# Patient Record
Sex: Male | Born: 1955 | Race: White | Marital: Married | Smoking: Never smoker
Health system: Southern US, Community
[De-identification: ages and names within clinical notes are randomized; demographics above are authoritative.]

## PROBLEM LIST (undated history)

## (undated) DIAGNOSIS — I10 Essential (primary) hypertension: Secondary | ICD-10-CM

## (undated) DIAGNOSIS — G4730 Sleep apnea, unspecified: Secondary | ICD-10-CM

## (undated) DIAGNOSIS — E663 Overweight: Secondary | ICD-10-CM

## (undated) DIAGNOSIS — I4892 Unspecified atrial flutter: Secondary | ICD-10-CM

## (undated) DIAGNOSIS — I5022 Chronic systolic (congestive) heart failure: Secondary | ICD-10-CM

## (undated) DIAGNOSIS — E119 Type 2 diabetes mellitus without complications: Secondary | ICD-10-CM

## (undated) DIAGNOSIS — E785 Hyperlipidemia, unspecified: Secondary | ICD-10-CM

---

## 2015-08-11 ENCOUNTER — Other Ambulatory Visit: Payer: Self-pay

## 2015-08-11 ENCOUNTER — Inpatient Hospital Stay: Payer: Self-pay

## 2015-08-11 ENCOUNTER — Emergency Department: Payer: Self-pay

## 2015-08-11 ENCOUNTER — Inpatient Hospital Stay
Admission: EM | Admit: 2015-08-11 | Discharge: 2015-08-15 | DRG: 308 | Disposition: A | Discharge status: Home / Self Care | Payer: Self-pay | Attending: Internal Medicine | Admitting: Internal Medicine

## 2015-08-11 DIAGNOSIS — E669 Obesity, unspecified: Secondary | ICD-10-CM | POA: Insufficient documentation

## 2015-08-11 DIAGNOSIS — I4892 Unspecified atrial flutter: Secondary | ICD-10-CM

## 2015-08-11 DIAGNOSIS — I480 Paroxysmal atrial fibrillation: Secondary | ICD-10-CM | POA: Insufficient documentation

## 2015-08-11 DIAGNOSIS — Z7984 Long term (current) use of oral hypoglycemic drugs: Secondary | ICD-10-CM

## 2015-08-11 DIAGNOSIS — Z79899 Other long term (current) drug therapy: Secondary | ICD-10-CM

## 2015-08-11 DIAGNOSIS — Z7982 Long term (current) use of aspirin: Secondary | ICD-10-CM

## 2015-08-11 DIAGNOSIS — G4733 Obstructive sleep apnea (adult) (pediatric): Secondary | ICD-10-CM | POA: Diagnosis present

## 2015-08-11 DIAGNOSIS — Z7901 Long term (current) use of anticoagulants: Secondary | ICD-10-CM

## 2015-08-11 DIAGNOSIS — Z8249 Family history of ischemic heart disease and other diseases of the circulatory system: Secondary | ICD-10-CM

## 2015-08-11 DIAGNOSIS — I471 Supraventricular tachycardia: Secondary | ICD-10-CM

## 2015-08-11 DIAGNOSIS — R0902 Hypoxemia: Secondary | ICD-10-CM | POA: Diagnosis present

## 2015-08-11 DIAGNOSIS — E119 Type 2 diabetes mellitus without complications: Secondary | ICD-10-CM | POA: Diagnosis present

## 2015-08-11 DIAGNOSIS — I483 Typical atrial flutter: Principal | ICD-10-CM | POA: Diagnosis present

## 2015-08-11 DIAGNOSIS — E785 Hyperlipidemia, unspecified: Secondary | ICD-10-CM | POA: Diagnosis present

## 2015-08-11 DIAGNOSIS — I5031 Acute diastolic (congestive) heart failure: Secondary | ICD-10-CM | POA: Insufficient documentation

## 2015-08-11 DIAGNOSIS — K219 Gastro-esophageal reflux disease without esophagitis: Secondary | ICD-10-CM | POA: Diagnosis present

## 2015-08-11 DIAGNOSIS — I110 Hypertensive heart disease with heart failure: Secondary | ICD-10-CM | POA: Diagnosis present

## 2015-08-11 DIAGNOSIS — I5021 Acute systolic (congestive) heart failure: Secondary | ICD-10-CM | POA: Diagnosis present

## 2015-08-11 DIAGNOSIS — R0602 Shortness of breath: Secondary | ICD-10-CM | POA: Insufficient documentation

## 2015-08-11 HISTORY — DX: Essential (primary) hypertension: I10

## 2015-08-11 HISTORY — DX: Hyperlipidemia, unspecified: E78.5

## 2015-08-11 HISTORY — DX: Overweight: E66.3

## 2015-08-11 LAB — BASIC METABOLIC PANEL
ANION GAP: 10 (ref 5–15)
BUN: 17 mg/dL (ref 6–20)
CALCIUM: 8.6 mg/dL — AB (ref 8.9–10.3)
CO2: 22 mmol/L (ref 22–32)
CREATININE: 0.99 mg/dL (ref 0.61–1.24)
Chloride: 105 mmol/L (ref 101–111)
GFR calc Af Amer: >60 mL/min (ref >60)
GLUCOSE: 181 mg/dL — AB (ref 65–99)
Potassium: 3.8 mmol/L (ref 3.5–5.1)
Sodium: 137 mmol/L (ref 135–145)

## 2015-08-11 LAB — CBC
HEMATOCRIT: 44.7 % (ref 40.0–52.0)
Hemoglobin: 15.6 g/dL (ref 13.0–18.0)
MCH: 32.5 pg (ref 26.0–34.0)
MCHC: 34.9 g/dL (ref 32.0–36.0)
MCV: 93.1 fL (ref 80.0–100.0)
PLATELETS: 219 10*3/uL (ref 150–440)
RBC: 4.8 MIL/uL (ref 4.40–5.90)
RDW: 12.5 % (ref 11.5–14.5)
WBC: 12 10*3/uL — AB (ref 3.8–10.6)

## 2015-08-11 LAB — BRAIN NATRIURETIC PEPTIDE: B Natriuretic Peptide: 297 pg/mL — ABNORMAL HIGH (ref 0.0–100.0)

## 2015-08-11 LAB — TROPONIN I
Troponin I: <0.03 ng/mL (ref <0.031)
Troponin I: 0.03 ng/mL (ref <0.031)
Troponin I: <0.03 ng/mL (ref <0.031)

## 2015-08-11 LAB — GLUCOSE, CAPILLARY
GLUCOSE-CAPILLARY: 156 mg/dL — AB (ref 65–99)
GLUCOSE-CAPILLARY: 178 mg/dL — AB (ref 65–99)
Glucose-Capillary: 175 mg/dL — ABNORMAL HIGH (ref 65–99)

## 2015-08-11 LAB — MRSA PCR SCREENING: MRSA by PCR: NEGATIVE

## 2015-08-11 LAB — TSH: TSH: 0.979 u[IU]/mL (ref 0.350–4.500)

## 2015-08-11 LAB — MAGNESIUM: Magnesium: 1.9 mg/dL (ref 1.7–2.4)

## 2015-08-11 MED ORDER — IOPAMIDOL (ISOVUE-370) INJECTION 76%
75.0000 mL | Freq: Once | INTRAVENOUS | Status: AC | PRN
  Administered 2015-08-11: 75 mL via INTRAVENOUS

## 2015-08-11 MED ORDER — SODIUM CHLORIDE 0.9% FLUSH
3.0000 mL | Freq: Two times a day (BID) | INTRAVENOUS | Status: DC
  Administered 2015-08-11 – 2015-08-13 (×5): 3 mL via INTRAVENOUS

## 2015-08-11 MED ORDER — ADENOSINE 6 MG/2ML IV SOLN
INTRAVENOUS | Status: AC
  Administered 2015-08-11: 11:00:00
  Filled 2015-08-11: qty 2

## 2015-08-11 MED ORDER — ASPIRIN EC 81 MG PO TBEC: 81.0000 mg | DELAYED_RELEASE_TABLET | Freq: Every day | ORAL | Status: DC

## 2015-08-11 MED ORDER — SODIUM CHLORIDE 0.9 % IV BOLUS (SEPSIS): 1000.0000 mL | Freq: Once | INTRAVENOUS | Status: DC

## 2015-08-11 MED ORDER — RIVAROXABAN 20 MG PO TABS
20.0000 mg | ORAL_TABLET | Freq: Every day | ORAL | Status: DC
  Administered 2015-08-11 – 2015-08-15 (×5): 20 mg via ORAL
  Filled 2015-08-11: qty 2
  Filled 2015-08-11 (×2): qty 1
  Filled 2015-08-11: qty 2
  Filled 2015-08-11 (×2): qty 1
  Filled 2015-08-11: qty 2

## 2015-08-11 MED ORDER — DILTIAZEM HCL 100 MG IV SOLR
5.0000 mg/h | INTRAVENOUS | Status: DC
  Administered 2015-08-11: 15 mg/h via INTRAVENOUS
  Administered 2015-08-11: 5 mg/h via INTRAVENOUS
  Administered 2015-08-12: 10 mg/h via INTRAVENOUS
  Administered 2015-08-12: 15 mg/h via INTRAVENOUS
  Filled 2015-08-11 (×5): qty 100

## 2015-08-11 MED ORDER — ACETAMINOPHEN 650 MG RE SUPP: 650.0000 mg | Freq: Four times a day (QID) | RECTAL | Status: DC | PRN

## 2015-08-11 MED ORDER — DILTIAZEM HCL 25 MG/5ML IV SOLN
10.0000 mg | Freq: Once | INTRAVENOUS | Status: AC
  Administered 2015-08-11: 10 mg via INTRAVENOUS
  Filled 2015-08-11: qty 5

## 2015-08-11 MED ORDER — ADENOSINE 12 MG/4ML IV SOLN
INTRAVENOUS | Status: AC
  Administered 2015-08-11: 12 mg via INTRAVENOUS
  Filled 2015-08-11: qty 8

## 2015-08-11 MED ORDER — INSULIN ASPART 100 UNIT/ML ~~LOC~~ SOLN: 0.0000 [IU] | Freq: Every day | SUBCUTANEOUS | Status: DC

## 2015-08-11 MED ORDER — SODIUM CHLORIDE 0.9 % IV BOLUS (SEPSIS)
1000.0000 mL | Freq: Once | INTRAVENOUS | Status: AC
  Administered 2015-08-11: 1000 mL via INTRAVENOUS

## 2015-08-11 MED ORDER — METOPROLOL TARTRATE 50 MG PO TABS
50.0000 mg | ORAL_TABLET | Freq: Once | ORAL | Status: AC
  Administered 2015-08-11: 50 mg via ORAL

## 2015-08-11 MED ORDER — ZOLPIDEM TARTRATE 5 MG PO TABS
5.0000 mg | ORAL_TABLET | Freq: Every evening | ORAL | Status: DC | PRN
  Administered 2015-08-11 – 2015-08-14 (×4): 5 mg via ORAL
  Filled 2015-08-11 (×5): qty 1

## 2015-08-11 MED ORDER — METOPROLOL TARTRATE 50 MG PO TABS
ORAL_TABLET | ORAL | Status: AC
  Administered 2015-08-11: 50 mg via ORAL
  Filled 2015-08-11: qty 1

## 2015-08-11 MED ORDER — METOPROLOL TARTRATE 25 MG PO TABS
25.0000 mg | ORAL_TABLET | Freq: Three times a day (TID) | ORAL | Status: DC
  Administered 2015-08-11 – 2015-08-13 (×7): 25 mg via ORAL
  Filled 2015-08-11 (×7): qty 1

## 2015-08-11 MED ORDER — DIGOXIN 0.25 MG/ML IJ SOLN
0.5000 mg | Freq: Once | INTRAMUSCULAR | Status: AC
  Administered 2015-08-11: 0.5 mg via INTRAVENOUS
  Filled 2015-08-11: qty 2

## 2015-08-11 MED ORDER — ADENOSINE 6 MG/2ML IV SOLN
6.0000 mg | Freq: Once | INTRAVENOUS | Status: AC
Start: 2015-08-11 — End: 2015-08-11
  Administered 2015-08-11: 6 mg via INTRAVENOUS

## 2015-08-11 MED ORDER — INSULIN ASPART 100 UNIT/ML ~~LOC~~ SOLN
0.0000 [IU] | Freq: Three times a day (TID) | SUBCUTANEOUS | Status: DC
  Administered 2015-08-11 – 2015-08-12 (×3): 2 [IU] via SUBCUTANEOUS
  Administered 2015-08-12: 3 [IU] via SUBCUTANEOUS
  Administered 2015-08-14: 7 [IU] via SUBCUTANEOUS
  Administered 2015-08-14: 1 [IU] via SUBCUTANEOUS
  Administered 2015-08-15: 2 [IU] via SUBCUTANEOUS
  Administered 2015-08-15: 3 [IU] via SUBCUTANEOUS
  Filled 2015-08-11: qty 1
  Filled 2015-08-11 (×2): qty 3
  Filled 2015-08-11: qty 5
  Filled 2015-08-11: qty 7
  Filled 2015-08-11 (×3): qty 2

## 2015-08-11 MED ORDER — METOPROLOL TARTRATE 5 MG/5ML IV SOLN
INTRAVENOUS | Status: AC
  Administered 2015-08-11: 10 mg via INTRAVENOUS
  Filled 2015-08-11: qty 10

## 2015-08-11 MED ORDER — METOPROLOL TARTRATE 5 MG/5ML IV SOLN
10.0000 mg | Freq: Once | INTRAVENOUS | Status: AC
  Administered 2015-08-11: 10 mg via INTRAVENOUS
  Filled 2015-08-11: qty 10

## 2015-08-11 MED ORDER — ENOXAPARIN SODIUM 40 MG/0.4ML ~~LOC~~ SOLN: 40.0000 mg | SUBCUTANEOUS | Status: DC

## 2015-08-11 MED ORDER — FUROSEMIDE 10 MG/ML IJ SOLN
40.0000 mg | Freq: Once | INTRAMUSCULAR | Status: AC
  Administered 2015-08-11: 40 mg via INTRAVENOUS
  Filled 2015-08-11: qty 4

## 2015-08-11 MED ORDER — FUROSEMIDE 10 MG/ML IJ SOLN
40.0000 mg | Freq: Two times a day (BID) | INTRAMUSCULAR | Status: DC
  Administered 2015-08-12 – 2015-08-15 (×7): 40 mg via INTRAVENOUS
  Filled 2015-08-11 (×8): qty 4

## 2015-08-11 MED ORDER — POTASSIUM CHLORIDE CRYS ER 20 MEQ PO TBCR: 40.0000 meq | EXTENDED_RELEASE_TABLET | Freq: Once | ORAL | Status: DC

## 2015-08-11 MED ORDER — ADENOSINE 12 MG/4ML IV SOLN
12.0000 mg | Freq: Once | INTRAVENOUS | Status: AC
  Administered 2015-08-11: 12 mg via INTRAVENOUS

## 2015-08-11 MED ORDER — ATORVASTATIN CALCIUM 20 MG PO TABS
20.0000 mg | ORAL_TABLET | Freq: Every day | ORAL | Status: DC
  Filled 2015-08-11: qty 1

## 2015-08-11 MED ORDER — ACETAMINOPHEN 325 MG PO TABS: 650.0000 mg | ORAL_TABLET | Freq: Four times a day (QID) | ORAL | Status: DC | PRN

## 2015-08-11 MED ORDER — METOPROLOL TARTRATE 5 MG/5ML IV SOLN
10.0000 mg | Freq: Once | INTRAVENOUS | Status: AC
Start: 2015-08-11 — End: 2015-08-11
  Administered 2015-08-11: 10 mg via INTRAVENOUS

## 2015-08-11 NOTE — H&P (Signed)
[facility] [place] NAME: Jeffrey Ortiz    MR#:  [id number]  DATE OF BIRTH:  02-Oct-1955  DATE OF ADMISSION:  08/11/2015  PRIMARY CARE PHYSICIAN: Deeann Cree, MD   REQUESTING/REFERRING PHYSICIAN: Dr Eula Listen  CHIEF COMPLAINT:   Chief Complaint  Patient presents with  . Gastroesophageal Reflux  . Shortness of Breath  . Tachycardia    HISTORY OF PRESENT ILLNESS:  Jeffrey Ortiz  is a 59 y.o. male with a known history of Hypertension, hyperlipidemia and diabetes. 2 weeks ago he stated he woke up at 3:30 in the morning with some acid reflux that went up into his nose and mouth and he thinks he may have aspirated. Every night when he is laying down he wakes up gasping for air. He's been coughing up some phlegm. He's felt palpitations for at least the last 7 days and shortness of breath. Today he went into the walk-in clinic and found his blood pressure very high and his heart rate very high and he was sent into the ER for further evaluation. In the ER he was found to be in rapid atrial flutter and numerous doses of IV medications were given without response. 2 doses of adenosine, 1 dose of Cardizem and 2 doses of metoprolol were given. Hospitalist services were contacted for further evaluation.  PAST MEDICAL HISTORY:   Past Medical History  Diagnosis Date  . Essential hypertension   . Diabetes mellitus without complication ([place])   . GERD (gastroesophageal reflux disease)     PAST SURGICAL HISTORY:  History reviewed. No pertinent past surgical history.  SOCIAL HISTORY:   Social History  Substance Use Topics  . Smoking status: Never Smoker   . Smokeless tobacco: Not on file  . Alcohol Use: No    FAMILY HISTORY:  No family history on file.  DRUG ALLERGIES:  No Known Allergies  REVIEW OF SYSTEMS:  CONSTITUTIONAL: No fever, Positive for fatigue. Positive for weight gain. EYES: Positive for blurred  vision EARS, NOSE, AND THROAT: No tinnitus or ear pain. Positive for sore throat RESPIRATORY: Positive for cough and shortness of breath. No wheezing or hemoptysis.  CARDIOVASCULAR: No chest pain. Positive for orthopnea and palpitations GASTROINTESTINAL: No nausea, vomiting, diarrhea or abdominal pain. No blood in bowel movements GENITOURINARY: No dysuria, hematuria.  ENDOCRINE: No polyuria, nocturia,  HEMATOLOGY: No anemia, easy bruising or bleeding SKIN: No rash or lesion. MUSCULOSKELETAL: No joint pain or arthritis.   NEUROLOGIC: No tingling, numbness, weakness. Feels lightheaded PSYCHIATRY: No anxiety or depression.   MEDICATIONS AT HOME:   Prior to Admission medications   Medication Sig Start Date End Date Taking? Authorizing Provider  aspirin EC 81 MG tablet Take 81 mg by mouth daily.   Yes Historical Provider, MD  atorvastatin (LIPITOR) 20 MG tablet Take 20 mg by mouth daily.   Yes Historical Provider, MD  lisinopril (PRINIVIL,ZESTRIL) 30 MG tablet Take 30 mg by mouth daily.   Yes Historical Provider, MD  metFORMIN (GLUCOPHAGE) 500 MG tablet Take by mouth 2 (two) times daily with a meal. May increase to 1000mg  two times a day as needed   Yes Historical Provider, MD      VITAL SIGNS:  Blood pressure 108/77, pulse 139, resp. rate 19, SpO2 95 %.  PHYSICAL EXAMINATION:  GENERAL:  60 y.o.-year-old patient lying in the bed with no acute distress.  EYES: Pupils equal, round, reactive to light and accommodation. No scleral icterus. Extraocular muscles intact.  HEENT: Head atraumatic, normocephalic. Oropharynx and nasopharynx clear.  NECK:  Supple, no jugular venous distention. No thyroid enlargement, no tenderness.  LUNGS: Decreased breath sounds bilaterally bases, no wheezing, positive rales at the bases. No rhonchi or crepitation. No use of accessory muscles of respiration.  CARDIOVASCULAR: S1, S2 irregularly irregular tachycardic. No murmurs, rubs, or gallops.  ABDOMEN: Soft,  nontender, nondistended. Bowel sounds present. No organomegaly or mass.  EXTREMITIES: Trace edema, no cyanosis, or clubbing.  NEUROLOGIC: Cranial nerves II through XII are intact. Muscle strength 5/5 in all extremities. Sensation intact. Gait not checked.  PSYCHIATRIC: The patient is alert and oriented x 3.  SKIN: No rash, lesion, or ulcer.   LABORATORY PANEL:   CBC  Recent Labs Lab 08/11/15 1008  WBC 12.0*  HGB 15.6  HCT 44.7  PLT 219   ------------------------------------------------------------------------------------------------------------------  Chemistries   Recent Labs Lab 08/11/15 1008  NA 137  K 3.8  CL 105  CO2 22  GLUCOSE 181*  BUN 17  CREATININE 0.99  CALCIUM 8.6*  MG 1.9   ------------------------------------------------------------------------------------------------------------------  Cardiac Enzymes  Recent Labs Lab 08/11/15 1008  TROPONINI <0.03     RADIOLOGY:  Ct Angio Chest Pe W/cm &/or Wo Cm  08/11/2015  CLINICAL DATA:  Tachycardia and bilateral lower extremity swelling. EXAM: CT ANGIOGRAPHY CHEST WITH CONTRAST TECHNIQUE: Multidetector CT imaging of the chest was performed using the standard protocol during bolus administration of intravenous contrast. Multiplanar CT image reconstructions and MIPs were obtained to evaluate the vascular anatomy. CONTRAST:  75 cc Isovue 370 intravenous COMPARISON:  None. FINDINGS: Mediastinum/Lymph Nodes: Mild cardiomegaly. No pericardial effusion. Non-opacified aorta without acute finding. Motion degraded study, best obtainable in this patient with shortness of breath. There is no indication of pulmonary embolism. Small fatty Bochdalek's hernia on the left. Lungs/Pleura: Diffuse septal thickening with small borderline moderate bilateral layering pleural effusion. Patchy ground-glass opacities in the bilateral lungs is consistent with alveolar edema in this clinical setting. Diffuse airway thickening is considered  congestive. Upper abdomen: No acute findings. Musculoskeletal: No chest wall mass or suspicious bone lesions identified. Review of the MIP images confirms the above findings. IMPRESSION: 1. CHF. 2. Motion degraded CTA without evidence of pulmonary embolism. Electronically Signed   By: Monte Fantasia M.D.   On: 08/11/2015 13:10   Dg Chest Portable 1 View  08/11/2015  CLINICAL DATA:  Tachycardia. EXAM: PORTABLE CHEST 1 VIEW COMPARISON:  None. FINDINGS: Cardiopericardial enlargement. Vascular pedicle widening and diffuse interstitial coarsening with Kerley lines. No asymmetric consolidation. Negative aortic contours for technique. No acute osseous finding. Small cervical ribs. IMPRESSION: CHF pattern. Electronically Signed   By: Monte Fantasia M.D.   On: 08/11/2015 10:42    EKG:   Atrial flutter 148 bpm nonspecific ST-T wave changes  IMPRESSION AND PLAN:   1. Atrial flutter with rapid ventricular response. Received 2 doses of adenosine, 1 dose of IV Cardizem and 2 doses of oral Cardizem. I ordered for 50 mg oral metoprolol and a Cardizem drip. I spoke with cardiology Dr. Rockey Situ to see the patient in consultation. I will obtain an echocardiogram. TSH in normal range. Monitor closely in the CCU stepdown 2. Acute congestive heart failure likely secondary to fast heart rate. Check an echocardiogram. Give 1 dose of IV Lasix at this point. 3. Accelerated hypertension on presentation to the hospital. Blood pressure lower now secondary to medications given. Continue to monitor closely in the CCU stepdown. 4. Hyperlipidemia unspecified on Lipitor 5. Type 2 diabetes. Hold Glucophage since  receive IV contrast. Put on sliding scale.   All the records are reviewed and case discussed with ED provider. Management plans discussed with the patient, family and they are in agreement.  CODE STATUS: Full code  TOTAL TIME TAKING CARE OF THIS PATIENT: 50 minutes. Patient will be admitted to the CCU stepdown for  closer monitoring of heart rate.   Loletha Grayer M.D on 08/11/2015 at 1:44 PM  Between 7am to 6pm - Pager - [phone]  After 6pm call admission pager [phone]  [facility] Office  [phone]  CC: Primary care physician; Deeann Cree, MD

## 2015-08-11 NOTE — Consult Note (Signed)
ANTICOAGULATION CONSULT NOTE - Initial Consult  Pharmacy Consult for xarelto Indication: aflutter  No Known Allergies  Patient Measurements:   Heparin Dosing Weight:   Vital Signs: BP: 108/77 mmHg (06/26 1200) Pulse Rate: 139 (06/26 1200)  Labs:  Recent Labs  08/11/15 1008  HGB 15.6  HCT 44.7  PLT 219  CREATININE 0.99  TROPONINI <0.03    CrCl cannot be calculated (Unknown ideal weight.).   Medical History: Past Medical History  Diagnosis Date  . Essential hypertension   . Diabetes mellitus without complication ([place])   . GERD (gastroesophageal reflux disease)   . Overweight   . Hyperlipidemia     Medications:  Scheduled:  . adenosine      . aspirin EC  81 mg Oral Daily  . atorvastatin  20 mg Oral Daily  . furosemide  40 mg Intravenous Once  . furosemide  40 mg Intravenous BID  . insulin aspart  0-5 Units Subcutaneous QHS  . insulin aspart  0-9 Units Subcutaneous TID WC  . metoprolol tartrate  25 mg Oral TID  . rivaroxaban  20 mg Oral Q supper  . sodium chloride flush  3 mL Intravenous Q12H    Assessment: Pt is a 60 year old male in aflutter. CHA2DS2VASc = 2 (HTN/DM). Pharmacy consulted to dose xarelto.  IBW CrCl=89 ml/min  Based on weight of 97.5kg and height of 72in in care everywhere.  Goal of Therapy:   Monitor platelets by anticoagulation protocol: Yes   Plan:  Xarelto 20mg  daily with evening meal. Pharmacy to continue to monitor. Thank you for the consult.  Ramond Dial, Pharm.D Clinical Pharmacist   08/11/2015,2:49 PM

## 2015-08-11 NOTE — ED Provider Notes (Signed)
[facility] Emergency Department Provider Note  ____________________________________________  Time seen: Approximately 10:11 AM  I have reviewed the triage vital signs and the nursing notes.   HISTORY  Chief Complaint Gastroesophageal Reflux; Shortness of Breath; and Tachycardia    HPI Jeffrey Ortiz is a 60 y.o. male with a history of HTN and DM 2 sent from clinic for tachycardia with a heart rate in the 140s. The patient reports that for the past 2 weeks he has been having difficulty sleeping due to "gasping for air." He has also been having decreased exercise tolerance, feeling very tired when he tries to exert himself in any way. He denies any chest pain, pressure or tightness, palpitations, lightheadedness or syncope. No recent illness. No recent changes in medications except that he has self decreased his antihyperlipidemic. He takes propranolol for hypertension. The patient did note symmetric bilateral edema of the ankles without calf pain today.   Past Medical History  Diagnosis Date  . Hypertension   . Diabetes mellitus without complication ([place])     There are no active problems to display for this patient.   History reviewed. No pertinent past surgical history.  Current Outpatient Rx  Name  Route  Sig  Dispense  Refill  . aspirin EC 81 MG tablet   Oral   Take 81 mg by mouth daily.         Marland Kitchen atorvastatin (LIPITOR) 20 MG tablet   Oral   Take 20 mg by mouth daily.         Marland Kitchen lisinopril (PRINIVIL,ZESTRIL) 30 MG tablet   Oral   Take 30 mg by mouth daily.         . metFORMIN (GLUCOPHAGE) 500 MG tablet   Oral   Take by mouth 2 (two) times daily with a meal. May increase to 1000mg  two times a day as needed           Allergies Review of patient's allergies indicates no known allergies.  No family history on file.  Social History Social History  Substance Use Topics  . Smoking status: Never Smoker   . Smokeless tobacco: None   . Alcohol Use: No    Review of Systems Constitutional: No fever/chills.No lightheadedness or syncope. Also decreased exercise tolerance. Eyes: No visual changes. ENT: No sore throat. No congestion or rhinorrhea. Cardiovascular: Denies chest pain. Denies palpitations. Positive tachycardia. Respiratory: Positive orthopnea..  No cough. Gastrointestinal: No abdominal pain.  No nausea, no vomiting.  No diarrhea.  No constipation. Genitourinary: Negative for dysuria. Musculoskeletal: Negative for back pain. Positive mild new symmetric bilateral lower extremity edema. No calf pain. Skin: Negative for rash. Neurological: Negative for headaches. No focal numbness, tingling or weakness.  Endocrine: No history of thyroid dysfunction. 10-point ROS otherwise negative.  ____________________________________________   PHYSICAL EXAM:  VITAL SIGNS: ED Triage Vitals  Enc Vitals Group     BP --      Pulse --      Resp --      Temp --      Temp src --      SpO2 --      Weight --      Height --      Head Cir --      Peak Flow --      Pain Score --      Pain Loc --      Pain Edu? --      Excl. in [place]? --  Constitutional: Alert and oriented. Well appearing and in no acute distress. Answers questions appropriately. Eyes: Conjunctivae are normal.  EOMI. No scleral icterus. Head: Atraumatic. Nose: No congestion/rhinnorhea. Mouth/Throat: Mucous membranes are moist.  Neck: No stridor.  Supple.  No JVD. Cardiovascular: Fast rate, regular rhythm. No murmurs, rubs or gallops.  Respiratory: Normal respiratory effort.  No accessory muscle use or retractions. Lungs CTAB.  No wheezes, rales or ronchi. Gastrointestinal: Soft, nontender and nondistended.  No guarding or rebound.  No peritoneal signs. Musculoskeletal: Minimal mildly pitting lower extremity edema to the distal tibial shaft bilaterally. No ttp in the calves or palpable cords.  Negative Homan's sign. Neurologic:  A&Ox3.  Speech is  clear.  Face and smile are symmetric.  EOMI.  Moves all extremities well. Skin:  Skin is warm, dry and intact. No rash noted. Psychiatric: Mood and affect are normal. Speech and behavior are normal.  Normal judgement  ____________________________________________   LABS (all labs ordered are listed, but only abnormal results are displayed)  Labs Reviewed  CBC - Abnormal; Notable for the following:    WBC 12.0 (*)    All other components within normal limits  BASIC METABOLIC PANEL - Abnormal; Notable for the following:    Glucose, Bld 181 (*)    Calcium 8.6 (*)    All other components within normal limits  MAGNESIUM  TROPONIN I  TSH  BRAIN NATRIURETIC PEPTIDE   ____________________________________________  EKG  ED ECG REPORT I, Eula Listen, the attending physician, personally viewed and interpreted this ECG.   Date: 08/11/2015  EKG Time: 959  Rate: 145  Rhythm: sinus tachycardia  Axis: normal  Intervals:prolonged Qtc  ST&T Change: No STEMI  ____________________________________________  RADIOLOGY  Dg Chest Portable 1 View  08/11/2015  CLINICAL DATA:  Tachycardia. EXAM: PORTABLE CHEST 1 VIEW COMPARISON:  None. FINDINGS: Cardiopericardial enlargement. Vascular pedicle widening and diffuse interstitial coarsening with Kerley lines. No asymmetric consolidation. Negative aortic contours for technique. No acute osseous finding. Small cervical ribs. IMPRESSION: CHF pattern. Electronically Signed   By: Monte Fantasia M.D.   On: 08/11/2015 10:42    ____________________________________________   PROCEDURES  Procedure(s) performed: None  Critical Care performed: Yes, see critical care note(s) ____________________________________________   INITIAL IMPRESSION / ASSESSMENT AND PLAN / ED COURSE  Pertinent labs & imaging results that were available during my care of the patient were reviewed by me and considered in my medical decision making (see chart for  details).  60 y.o. male with a history of HTN and DM presenting with tachycardia. The patient's EKG is consistent with a regular complex tachycardia. The patient was given 6 mg of adenosine without any changes. After 12 mg of adenosine, he had a brief slowing down of his rate in the underlying rhythm may have been consistent with a 3-1 flutter. I have ordered metoprolol and we will continue to monitor his symptoms as well as his vital signs. At this time, the patient is mentating well and not in any acute distress. I sent laboratory studies looking for abnormal electrolytes, thyroid dysfunction, and signs of MI the patient has not been having any signs or symptoms that would be consistent with acute coronary syndrome.  CRITICAL CARE Performed by: Eula Listen   Total critical care time: 40 minutes  Critical care time was exclusive of separately billable procedures and treating other patients.  Critical care was necessary to treat or prevent imminent or life-threatening deterioration.  Critical care was time spent personally by me on the  following activities: development of treatment plan with patient and/or surrogate as well as nursing, discussions with consultants, evaluation of patient's response to treatment, examination of patient, obtaining history from patient or surrogate, ordering and performing treatments and interventions, ordering and review of laboratory studies, ordering and review of radiographic studies, pulse oximetry and re-evaluation of patient's condition.  ----------------------------------------- 10:58 AM on 08/11/2015 -----------------------------------------  The patient has had no response to IV metoprolol, and his heart rate continues to be in the 140s. He is maintaining blood pressure in the 160s. I will try diltiazem and reevaluate the patient.  ----------------------------------------- 11:31 AM on  08/11/2015 -----------------------------------------  The patient's heart rate is still 144, and his blood pressure is in the 120s. He states that he is feeling better since his arrival. Burnis Medin try another liter fluid and another dose of metoprolol. The patient's chest x-ray does show possible CHF pattern, but given that he does not have a significant amount of fluid overload on his examination, we will air on the side of rehydration rather than diuresis at this time. The patient's TSH is reassuring and his troponin is negative. Plan admission.  ____________________________________________  FINAL CLINICAL IMPRESSION(S) / ED DIAGNOSES  Final diagnoses:  SVT (supraventricular tachycardia) (HCC)  Shortness of breath      NEW MEDICATIONS STARTED DURING THIS VISIT:  New Prescriptions   No medications on file     Eula Listen, MD 08/11/15 1133

## 2015-08-11 NOTE — ED Notes (Signed)
Pt arrives to ER via POV from [facility]. Pt went to walk in to be evaluated for "gasping for air when I'm falling asleep" X 2 weeks. Pt had reflux episode x 2 weeks ago. Pt HR found to be in 150's at [facility]. Pt wheeled to [facility]. Denies pain. Pt alert and oriented X4, active, cooperative, pt in NAD. RR even and unlabored, color WNL.

## 2015-08-11 NOTE — Progress Notes (Signed)
Paged MD Earleen Newport per patient request to refuse taking atorvastatin due to causing joint pain. Patient stated he is non compliant with this medication at home due to joint pain. Provider did not want to change medication order at this time. Will continue to monitor.

## 2015-08-11 NOTE — Consult Note (Signed)
a  Cardiology Consult    Patient ID: Jeffrey Ortiz MRN: [id number], DOB/AGE: 1955-08-01   Admit date: 08/11/2015 Date of Consult: 08/11/2015  Primary Physician: Deeann Cree, MD Primary Cardiologist: new - seen by Johnny Bridge, MD  Requesting Provider: R. [place]  Patient Profile    60 y/o ? with a h/o HTN, DM, and GERD, who presented to the ED from his PCP's office today 2/2 dyspnea, reduced exercise tolerance, lower ext edema, and atrial flutter.  Past Medical History   Past Medical History  Diagnosis Date  . Essential hypertension   . Diabetes mellitus without complication ([place])   . GERD (gastroesophageal reflux disease)   . Overweight   . Hyperlipidemia     Allergies  No Known Allergies  History of Present Illness    61 y/o ? with a h/o HTN, DM, and HL.  He was in his usoh until ~ 10 days ago, when he awoke suddenly one morning with severe GERD-like symptoms and burning in his throat.  He went into his bathroom and vomited x 1.  He didn't think too much of it but the following day, he noted fatigue and by last Monday, 6/19, he began to note tachypalpitations with reduced exercise tolerance and DOE.  This progressed over the week and became associated with mild ankle edema and orthopnea.  He says that over the past few nights he has been very restless related to orthopnea, which was especially bad last night.  Because of progressive symptoms, he presented to his PCP and was found to be in rapid atrial flutter.  He was referred to the ED for evaluation.  Here, he has been placed on IV dilt and rates are now in the 130's w/ 2:1 atrial flutter.  He is feeling somewhat better.  He denies any h/o chest pain.  Inpatient Medications    . adenosine      . aspirin EC  81 mg Oral Daily  . atorvastatin  20 mg Oral Daily  . enoxaparin (LOVENOX) injection  40 mg Subcutaneous Q24H  . furosemide  40 mg Intravenous Once  . insulin aspart  0-5 Units Subcutaneous QHS  . insulin  aspart  0-9 Units Subcutaneous TID WC  . metoprolol tartrate  25 mg Oral TID  . sodium chloride flush  3 mL Intravenous Q12H    Family History    Family History  Problem Relation Age of Onset  . Heart attack Father     died in his 24's w/ cancer but dx CAD in his 62's.  . Cancer Father   . Heart attack Brother     died in early 42's.  . Other Mother     alive @ 57    Social History    Social History   Social History  . Marital Status: Married    Spouse Name: N/A  . Number of Children: N/A  . Years of Education: N/A   Occupational History  . Not on file.   Social History Main Topics  . Smoking status: Never Smoker   . Smokeless tobacco: Not on file  . Alcohol Use: 0.0 oz/week    0 Standard drinks or equivalent per week     Comment: occasional drink - rare (< 1/wk).  . Drug Use: No     Comment: Used marijuana a few times in college.  Marland Kitchen Sexual Activity: Not on file   Other Topics Concern  . Not on file   Social History Narrative  Lives locally with wife.  Does not routinely exercise.       Review of Systems    General:  No chills, fever, night sweats or weight changes.  Cardiovascular:  No chest pain, +++ dyspnea on exertion, +++ LE edema, +++ orthopnea, +++ palpitations, +++ paroxysmal nocturnal dyspnea. Dermatological: No rash, lesions/masses Respiratory: No cough, +++ dyspnea.  Snores. Urologic: No hematuria, dysuria Abdominal:   No nausea, vomiting, diarrhea, bright red blood per rectum, melena, or hematemesis Neurologic:  No visual changes, wkns, changes in mental status. All other systems reviewed and are otherwise negative except as noted above.  Physical Exam    Blood pressure 108/77, pulse 139, resp. rate 19, SpO2 95 %.  General: Pleasant, NAD Psych: Normal affect. Neuro: Alert and oriented X 3. Moves all extremities spontaneously. HEENT: Normal  Neck: Supple without bruits or JVD. Lungs:  Resp regular and unlabored, diminished breath sounds  bilat w/ bibasilar crackles. Heart: RRR, tachy, no s3, s4, or murmurs. Abdomen: Soft, non-tender, non-distended, BS + x 4.  Extremities: No clubbing, cyanosis.  Trace bilat LE edema. DP/PT/Radials 2+ and equal bilaterally.  Labs     Recent Labs  08/11/15 1008  TROPONINI <0.03   Lab Results  Component Value Date   WBC 12.0* 08/11/2015   HGB 15.6 08/11/2015   HCT 44.7 08/11/2015   MCV 93.1 08/11/2015   PLT 219 08/11/2015     Recent Labs Lab 08/11/15 1008  NA 137  K 3.8  CL 105  CO2 22  BUN 17  CREATININE 0.99  CALCIUM 8.6*  GLUCOSE 181*    Radiology Studies    Ct Angio Chest Pe W/cm &/or Wo Cm  08/11/2015  CLINICAL DATA:  Tachycardia and bilateral lower extremity swelling. EXAM: CT ANGIOGRAPHY CHEST WITH CONTRAST TECHNIQUE: Multidetector CT imaging of the chest was performed using the standard protocol during bolus administration of intravenous contrast. Multiplanar CT image reconstructions and MIPs were obtained to evaluate the vascular anatomy. CONTRAST:  75 cc Isovue 370 intravenous COMPARISON:  None. FINDINGS: Mediastinum/Lymph Nodes: Mild cardiomegaly. No pericardial effusion. Non-opacified aorta without acute finding. Motion degraded study, best obtainable in this patient with shortness of breath. There is no indication of pulmonary embolism. Small fatty Bochdalek's hernia on the left. Lungs/Pleura: Diffuse septal thickening with small borderline moderate bilateral layering pleural effusion. Patchy ground-glass opacities in the bilateral lungs is consistent with alveolar edema in this clinical setting. Diffuse airway thickening is considered congestive. Upper abdomen: No acute findings. Musculoskeletal: No chest wall mass or suspicious bone lesions identified. Review of the MIP images confirms the above findings. IMPRESSION: 1. CHF. 2. Motion degraded CTA without evidence of pulmonary embolism. Electronically Signed   By: Monte Fantasia M.D.   On: 08/11/2015 13:10   Dg  Chest Portable 1 View  08/11/2015  CLINICAL DATA:  Tachycardia. EXAM: PORTABLE CHEST 1 VIEW COMPARISON:  None. FINDINGS: Cardiopericardial enlargement. Vascular pedicle widening and diffuse interstitial coarsening with Kerley lines. No asymmetric consolidation. Negative aortic contours for technique. No acute osseous finding. Small cervical ribs. IMPRESSION: CHF pattern. Electronically Signed   By: Monte Fantasia M.D.   On: 08/11/2015 10:42    ECG & Cardiac Imaging    Aflutter, 2:1 conduction, 145, delayed R progression, nonspecific st/t changes.  Assessment & Plan    1.  Atrial Flutter with RVR:  Pt presented to the ED today with a 1 wk h/o tachypalpitations and progressive DOE, orthopnea, and lower extremity edema.  He has been found to be  in atrial flutter with RVR.  He is currently on IV dilt with some improvement in rate and symptoms, though he remains tachy in the 130's w/ 2:1 flutter.  CHA2DS2VASc = 2 (HTN/DM).  Cont IV dilt.  Add oral benign essential tremor and Xarelto 20 mg daily.  Check echo when rate better. Concerned about possibility of tachy-induced cardiomyopathy.  If he doesn't convert, would plan TEE DCCV following third dose of xarelto on Wednesday.  Ultimately would benefit from outpt EP referral for Aflutter RFCA if he is interested.  2.  Acute CHF: ? Systolic vs diastolic in setting of #1.  Concerned about possible tachy-mediated cardiomyopathy.  Diurese.  Check echo.  Cardiovert if necessary as above.  3.  Essential HTN:  Follow on IV dilt and w/ diuresis.  4.  HL:  Cont statin.  5.  Snoring:  Will need outpt sleep eval.  6.  Type II DM:  Per IM. Signed, Murray Hodgkins, NP 08/11/2015, 2:08 PM

## 2015-08-12 ENCOUNTER — Inpatient Hospital Stay (HOSPITAL_COMMUNITY)
Admit: 2015-08-12 | Discharge: 2015-08-12 | Disposition: A | Discharge status: Home / Self Care | Payer: Self-pay | Attending: Internal Medicine | Admitting: Internal Medicine

## 2015-08-12 ENCOUNTER — Other Ambulatory Visit: Payer: Self-pay | Admitting: Cardiovascular Disease

## 2015-08-12 DIAGNOSIS — I4892 Unspecified atrial flutter: Secondary | ICD-10-CM

## 2015-08-12 LAB — ECHOCARDIOGRAM COMPLETE
AV Area VTI: 2.46 cm2
AV Peak grad: 4 mmHg
AV peak Index: 1.05
AV pk vel: 101 cm/s
Ao pk vel: 0.65 m/s
CHL CUP MV DEC (S): 116
E decel time: 116 msec
E/e' ratio: 17.32
FS: 12 % — AB (ref 28–44)
Height: 72 in
IVS/LV PW RATIO, ED: 0.91
LA diam end sys: 44 mm
LADIAMINDEX: 1.87 cm/m2
LASIZE: 44 mm
LAVOL: 78.2 mL
LAVOLA4C: 67.1 mL
LAVOLIN: 33.3 mL/m2
LDCA: 3.8 cm2
LV E/e' medial: 17.32
LV E/e'average: 17.32
LV PW d: 13.4 mm — AB (ref 0.6–1.1)
LVELAT: 3.81 cm/s
LVOT peak vel: 65.5 cm/s
LVOTD: 22 mm
MV pk E vel: 66 m/s
MVPKAVEL: 93.1 m/s
RV TAPSE: 16.9 mm
TDI e' lateral: 3.81
TDI e' medial: 3.81
Weight: 4021.19 oz

## 2015-08-12 LAB — GLUCOSE, CAPILLARY
GLUCOSE-CAPILLARY: 138 mg/dL — AB (ref 65–99)
GLUCOSE-CAPILLARY: 165 mg/dL — AB (ref 65–99)
GLUCOSE-CAPILLARY: 139 mg/dL — AB (ref 65–99)
Glucose-Capillary: 225 mg/dL — ABNORMAL HIGH (ref 65–99)
Glucose-Capillary: 154 mg/dL — ABNORMAL HIGH (ref 65–99)

## 2015-08-12 LAB — CBC
HCT: 41.2 % (ref 40.0–52.0)
Hemoglobin: 14.1 g/dL (ref 13.0–18.0)
MCH: 32.2 pg (ref 26.0–34.0)
MCHC: 34.2 g/dL (ref 32.0–36.0)
MCV: 94 fL (ref 80.0–100.0)
Platelets: 199 10*3/uL (ref 150–440)
RBC: 4.38 MIL/uL — AB (ref 4.40–5.90)
RDW: 12.6 % (ref 11.5–14.5)
WBC: 10.3 10*3/uL (ref 3.8–10.6)

## 2015-08-12 LAB — BASIC METABOLIC PANEL
Anion gap: 8 (ref 5–15)
BUN: 20 mg/dL (ref 6–20)
CALCIUM: 8.2 mg/dL — AB (ref 8.9–10.3)
CO2: 22 mmol/L (ref 22–32)
CREATININE: 1.01 mg/dL (ref 0.61–1.24)
Chloride: 109 mmol/L (ref 101–111)
GFR calc non Af Amer: >60 mL/min (ref >60)
Glucose, Bld: 124 mg/dL — ABNORMAL HIGH (ref 65–99)
Potassium: 3.8 mmol/L (ref 3.5–5.1)
SODIUM: 139 mmol/L (ref 135–145)

## 2015-08-12 LAB — LIPID PANEL
CHOL/HDL RATIO: 5.6 ratio
Cholesterol: 152 mg/dL (ref 0–200)
HDL: 27 mg/dL — AB (ref >40)
LDL Cholesterol: 105 mg/dL — ABNORMAL HIGH (ref 0–99)
TRIGLYCERIDES: 100 mg/dL (ref <150)
VLDL: 20 mg/dL (ref 0–40)

## 2015-08-12 MED ORDER — DIGOXIN 250 MCG PO TABS
0.2500 mg | ORAL_TABLET | Freq: Every day | ORAL | Status: DC
  Administered 2015-08-12 – 2015-08-15 (×5): 0.25 mg via ORAL
  Filled 2015-08-12: qty 1
  Filled 2015-08-12 (×2): qty 2
  Filled 2015-08-12: qty 1
  Filled 2015-08-12: qty 2

## 2015-08-12 MED ORDER — ZOLPIDEM TARTRATE 5 MG PO TABS: 5.0000 mg | ORAL_TABLET | Freq: Every evening | ORAL | Status: DC | PRN

## 2015-08-12 MED ORDER — EZETIMIBE 10 MG PO TABS
10.0000 mg | ORAL_TABLET | Freq: Every day | ORAL | Status: DC
  Administered 2015-08-12 – 2015-08-15 (×4): 10 mg via ORAL
  Filled 2015-08-12 (×4): qty 1

## 2015-08-12 NOTE — Progress Notes (Addendum)
Patient: Jeffrey Ortiz / Admit Date: 08/11/2015 / Date of Encounter: 08/12/2015, 8:18 AM   Subjective: Apnea overnight, rate better yesterday, rapid this AM up to 130s at rest Slept 5 hours, Good urine outpt in past 24 hours, less SOB   Review of Systems: Review of Systems  Constitutional: Negative.   Respiratory: Positive for shortness of breath.   Cardiovascular: Negative.   Gastrointestinal: Negative.   Musculoskeletal: Negative.   Neurological: Negative.   Psychiatric/Behavioral: Negative.   All other systems reviewed and are negative.   Objective: Telemetry: atrial flutter with rate 130s at rest Physical Exam: Blood pressure 136/104, pulse 57, temperature 98.2 F (36.8 C), temperature source Oral, resp. rate 20, height 6' (1.829 m), weight 251 lb 5.2 oz (114 kg), SpO2 93 %. Body mass index is 34.08 kg/(m^2). General: Well developed, well nourished, in no acute distress. Head: Normocephalic, atraumatic, sclera non-icteric, no xanthomas, nares are without discharge. Neck: Negative for carotid bruits.  Lungs: Clear bilaterally to auscultation without wheezes,+ Scant rales at the bases, Breathing is unlabored. Heart: regular tachy,  S1 S2 without murmurs, rubs, or gallops.  Abdomen: Soft, non-tender, non-distended with normoactive bowel sounds. No rebound/guarding. Extremities: No clubbing or cyanosis. No edema. Distal pedal pulses are 2+ and equal bilaterally. Neuro: Alert and oriented X 3. Moves all extremities spontaneously. Psych:  Responds to questions appropriately with a normal affect.   Intake/Output Summary (Last 24 hours) at 08/12/15 0818 Last data filed at 08/12/15 0600  Gross per 24 hour  Intake 374.08 ml  Output   1350 ml  Net -975.92 ml    Inpatient Medications:  . digoxin  0.25 mg Oral Daily  . ezetimibe  10 mg Oral Daily  . furosemide  40 mg Intravenous BID  . insulin aspart  0-5 Units Subcutaneous QHS  . insulin aspart  0-9 Units Subcutaneous  TID WC  . metoprolol tartrate  25 mg Oral TID  . rivaroxaban  20 mg Oral Q supper  . sodium chloride flush  3 mL Intravenous Q12H   Infusions:  . diltiazem (CARDIZEM) infusion 5 mg/hr (08/11/15 2100)    Labs:  Recent Labs  08/11/15 1008 08/12/15 0351  NA 137 139  K 3.8 3.8  CL 105 109  CO2 22 22  GLUCOSE 181* 124*  BUN 17 20  CREATININE 0.99 1.01  CALCIUM 8.6* 8.2*  MG 1.9  --    No results for input(s): AST, ALT, ALKPHOS, BILITOT, PROT, ALBUMIN in the last 72 hours.  Recent Labs  08/11/15 1008 08/12/15 0351  WBC 12.0* 10.3  HGB 15.6 14.1  HCT 44.7 41.2  MCV 93.1 94.0  PLT 219 199    Recent Labs  08/11/15 1008 08/11/15 1506 08/11/15 1920  TROPONINI <0.03 0.03 <0.03   Invalid input(s): POCBNP No results for input(s): HGBA1C in the last 72 hours.   Weights: Filed Weights   08/11/15 1510  Weight: 251 lb 5.2 oz (114 kg)     Radiology/Studies:  Ct Angio Chest Pe W/cm &/or Wo Cm  08/11/2015  CLINICAL DATA:  Tachycardia and bilateral lower extremity swelling. EXAM: CT ANGIOGRAPHY CHEST WITH CONTRAST TECHNIQUE: Multidetector CT imaging of the chest was performed using the standard protocol during bolus administration of intravenous contrast. Multiplanar CT image reconstructions and MIPs were obtained to evaluate the vascular anatomy. CONTRAST:  75 cc Isovue 370 intravenous COMPARISON:  None. FINDINGS: Mediastinum/Lymph Nodes: Mild cardiomegaly. No pericardial effusion. Non-opacified aorta without acute finding. Motion degraded study, best obtainable in  this patient with shortness of breath. There is no indication of pulmonary embolism. Small fatty Bochdalek's hernia on the left. Lungs/Pleura: Diffuse septal thickening with small borderline moderate bilateral layering pleural effusion. Patchy ground-glass opacities in the bilateral lungs is consistent with alveolar edema in this clinical setting. Diffuse airway thickening is considered congestive. Upper abdomen: No  acute findings. Musculoskeletal: No chest wall mass or suspicious bone lesions identified. Review of the MIP images confirms the above findings. IMPRESSION: 1. CHF. 2. Motion degraded CTA without evidence of pulmonary embolism. Electronically Signed   By: Monte Fantasia M.D.   On: 08/11/2015 13:10   Dg Chest Portable 1 View  08/11/2015  CLINICAL DATA:  Tachycardia. EXAM: PORTABLE CHEST 1 VIEW COMPARISON:  None. FINDINGS: Cardiopericardial enlargement. Vascular pedicle widening and diffuse interstitial coarsening with Kerley lines. No asymmetric consolidation. Negative aortic contours for technique. No acute osseous finding. Small cervical ribs. IMPRESSION: CHF pattern. Electronically Signed   By: Monte Fantasia M.D.   On: 08/11/2015 10:42     Assessment and Plan  60 y.o. male   --- Atrial flutter, typical Would continue diltiazem infusion 10 to 15 mg/hr, Digoxin 0.25 mg daily Continue metoprolol as BP tolerates  Will plan for TEE and cardioversion, discussed with specials,  Plan for Wednesday afternoon at 2 pm -Echocardiogram TTEE is pending Continue xarelto 20 mg daily  --- GERD May benefit from PPI  --- Hypertension hold  lisinopril  Continue diltiazem and metoprolol  --- Diabetes type 2 Would continue metformin, per medicine service  ----Hyperlipidemia Continue statin   Total encounter time more than 35 minutes  Greater than 50% was spent in counseling and coordination of care with the patient   Signed, Esmond Plants, MD, Ph.D. [facility] 08/12/2015, 8:18 AM

## 2015-08-12 NOTE — Consult Note (Signed)
ELECTROPHYSIOLOGY CONSULT NOTE  Patient ID: Jeffrey Ortiz, MRN: [id number], DOB/AGE: [birth year] 60 y.o. Admit date: 08/11/2015 Date of Consult: 08/12/2015  Primary Physician: Deeann Cree, MD Primary Cardiologist: TG Chief Complaint: Atrial flutter   HPI Jeffrey Ortiz is a 60 y.o. male  Seen with atrial flutter. He was in his usoh until ~ 10 days ago, when he awoke suddenly one morning with severe GERD-like symptoms and burning in his throat. He went into his bathroom and vomited x 1. He didn't think too much of it but the following day, he noted fatigue and by last Monday, 6/19, he began to note tachypalpitations with reduced exercise tolerance and DOE. This progressed over the week and became associated with mild ankle edema and orthopnea. He says that over the past few nights he has been very restless related to orthopnea, which was especially bad last night. Because of progressive symptoms, he presented to his PCP and was found to be in rapid atrial flutter. He has  been placed on IV dilt digoxin and metoprolol and rates remain rapid in the 130's w/ 2:1 atrial flutter. He was given Lasix with some diuresis.  He has not had chest pain  CHADS-VASc score is 2 for diabetes and hypertension; we await assessment of LV function  He has sleep disordered breathing and apnea by observation daytime somnolence.  Past Medical History  Diagnosis Date  . Essential hypertension   . Diabetes mellitus without complication ([place])   . GERD (gastroesophageal reflux disease)   . Overweight   . Hyperlipidemia       Surgical History: History reviewed. No pertinent past surgical history.   Home Meds: Prior to Admission medications   Medication Sig Start Date End Date Taking? Authorizing Provider  aspirin EC 81 MG tablet Take 81 mg by mouth daily.   Yes Historical Provider, MD  atorvastatin (LIPITOR) 20 MG tablet Take 20 mg by mouth daily.   Yes Historical Provider, MD  lisinopril  (PRINIVIL,ZESTRIL) 30 MG tablet Take 30 mg by mouth daily.   Yes Historical Provider, MD  metFORMIN (GLUCOPHAGE) 500 MG tablet Take by mouth 2 (two) times daily with a meal. May increase to 1000mg  two times a day as needed   Yes Historical Provider, MD    Inpatient Medications:  . digoxin  0.25 mg Oral Daily  . ezetimibe  10 mg Oral Daily  . furosemide  40 mg Intravenous BID  . insulin aspart  0-5 Units Subcutaneous QHS  . insulin aspart  0-9 Units Subcutaneous TID WC  . metoprolol tartrate  25 mg Oral TID  . rivaroxaban  20 mg Oral Q supper  . sodium chloride flush  3 mL Intravenous Q12H    Allergies: No Known Allergies  Social History   Social History  . Marital Status: Married    Spouse Name: N/A  . Number of Children: N/A  . Years of Education: N/A   Occupational History  . Not on file.   Social History Main Topics  . Smoking status: Never Smoker   . Smokeless tobacco: Not on file  . Alcohol Use: 0.0 oz/week    0 Standard drinks or equivalent per week     Comment: occasional drink - rare (< 1/wk).  . Drug Use: No     Comment: Used marijuana a few times in college.  Marland Kitchen Sexual Activity: Not on file   Other Topics Concern  . Not on file   Social History Narrative   Lives locally  with wife.  Does not routinely exercise.       Family History  Problem Relation Age of Onset  . Heart attack Father     died in his 10's w/ cancer but dx CAD in his 36's.  . Cancer Father   . Heart attack Brother     died in early 23's.  . Other Mother     alive @ 10     ROS:  Please see the history of present illness.     All other systems reviewed and negative.    Physical Exam:   Blood pressure 136/104, pulse 57, temperature 98.2 F (36.8 C), temperature source Oral, resp. rate 20, height 6' (1.829 m), weight 251 lb 5.2 oz (114 kg), SpO2 93 %. General: Well developed, well nourished male in no acute distress. Head: Normocephalic, atraumatic, sclera non-icteric, no xanthomas,  nares are without discharge. EENT: normal Lymph Nodes:  none Back: without scoliosis/kyphosis , no CVA tendersness Neck: Negative for carotid bruits. [id number] Lungs: Clear bilaterally to auscultation without wheezes, rales, or rhonchi. Breathing is unlabored. Heart: Rapid but RR with S1 S2. No  murmur , rubs, or gallops appreciated. Abdomen: Soft, non-tender, non-distended with normoactive bowel sounds. No hepatomegaly. No rebound/guarding. No obvious abdominal masses. Msk:  Strength and tone appear normal for age. Extremities: No clubbing or cyanosis.  tr edema.  Distal pedal pulses are 2+ and equal bilaterally. Skin: Warm and Dry Neuro: Alert and oriented X 3. CN III-XII intact Grossly normal sensory and motor function . Psych:  Responds to questions appropriately with a normal affect.      Labs: Cardiac Enzymes  Recent Labs  08/11/15 1008 08/11/15 1506 08/11/15 1920  TROPONINI <0.03 0.03 <0.03   CBC Lab Results  Component Value Date   WBC 10.3 08/12/2015   HGB 14.1 08/12/2015   HCT 41.2 08/12/2015   MCV 94.0 08/12/2015   PLT 199 08/12/2015   PROTIME: No results for input(s): LABPROT, INR in the last 72 hours. Chemistry  Recent Labs Lab 08/12/15 0351  NA 139  K 3.8  CL 109  CO2 22  BUN 20  CREATININE 1.01  CALCIUM 8.2*  GLUCOSE 124*   Lipids Lab Results  Component Value Date   CHOL 152 08/12/2015   HDL 27* 08/12/2015   LDLCALC 105* 08/12/2015   TRIG 100 08/12/2015   BNP No results found for: PROBNP Thyroid Function Tests:  Recent Labs  08/11/15 1008  TSH 0.979      Miscellaneous No results found for: DDIMER  Radiology/Studies:  Ct Angio Chest Pe W/cm &/or Wo Cm  08/11/2015  CLINICAL DATA:  Tachycardia and bilateral lower extremity swelling. EXAM: CT ANGIOGRAPHY CHEST WITH CONTRAST TECHNIQUE: Multidetector CT imaging of the chest was performed using the standard protocol during bolus administration of intravenous contrast. Multiplanar CT image  reconstructions and MIPs were obtained to evaluate the vascular anatomy. CONTRAST:  75 cc Isovue 370 intravenous COMPARISON:  None. FINDINGS: Mediastinum/Lymph Nodes: Mild cardiomegaly. No pericardial effusion. Non-opacified aorta without acute finding. Motion degraded study, best obtainable in this patient with shortness of breath. There is no indication of pulmonary embolism. Small fatty Bochdalek's hernia on the left. Lungs/Pleura: Diffuse septal thickening with small borderline moderate bilateral layering pleural effusion. Patchy ground-glass opacities in the bilateral lungs is consistent with alveolar edema in this clinical setting. Diffuse airway thickening is considered congestive. Upper abdomen: No acute findings. Musculoskeletal: No chest wall mass or suspicious bone lesions identified. Review of the MIP images confirms  the above findings. IMPRESSION: 1. CHF. 2. Motion degraded CTA without evidence of pulmonary embolism. Electronically Signed   By: Monte Fantasia M.D.   On: 08/11/2015 13:10   Dg Chest Portable 1 View  08/11/2015  CLINICAL DATA:  Tachycardia. EXAM: PORTABLE CHEST 1 VIEW COMPARISON:  None. FINDINGS: Cardiopericardial enlargement. Vascular pedicle widening and diffuse interstitial coarsening with Kerley lines. No asymmetric consolidation. Negative aortic contours for technique. No acute osseous finding. Small cervical ribs. IMPRESSION: CHF pattern. Electronically Signed   By: Monte Fantasia M.D.   On: 08/11/2015 10:42    EKG: *  Atrial flutter typical 2:1 143 bpm Normal intervals   Assessment and Plan:  Atrial flutter-atypical  Congestive heart failure associated with the above will be function pending  Obstructive sleep apnea with sleep disorder breathing and daytime somnolence  Hypertension  Diabetes   The patient has typical atrial flutter with symptoms of congestive heart failure. Rate control is notoriously difficult with flutter and restoration of sinus rhythm at  the earliest convenience is appropriate. Using NOACs 36 hours to 48 hours of antecedent anticoagulation is reasonable  Prior to TEE cardioversion.  He received his first dose last night.  Following restoration of sinus rhythm ongoing medical therapy will be informed by assessment of LV function, specifically ACE inhibitors and specific beta blockers.  We have discussed also the role of catheter ablation as primary therapy for atrial flutter. I've recommended that we pursue this not withstanding the potential that may be as high as 50-70% for atrial fibrillation to emerge subsequently.  I have also explained to him and his family the importance of treatment of sleep apnea as the impact of his not being treated on recurrence of atrial fibrillation is increasingly impressive, recent studies demonstrating that renders the effectiveness of catheter ablation almost nonexistent.  We have discussed also the importance of weight loss  We will plan follow-up in about 4 weeks in the office and anticipate catheter ablation the following week.     Virl Axe

## 2015-08-12 NOTE — Progress Notes (Signed)
*  PRELIMINARY RESULTS* Echocardiogram 2D Echocardiogram has been performed.  Jeffrey Ortiz 08/12/2015, 11:15 AM

## 2015-08-12 NOTE — Progress Notes (Addendum)
Patient ID: Jeffrey Ortiz, male   DOB: 1956/01/30, 60 y.o.   MRN: [id number] [facility] [place] NAME: Jeffrey Ortiz    MR#:  [id number]  DATE OF BIRTH:  [birth year]  SUBJECTIVE:   Came in with increasing shortness of breath hypoxia was found to be in rapid A. fib flutter in the ICU getting IV Cardizem drip. Denies any chest pain. Continues with HR 140"s this am. Received po meds early REVIEW OF SYSTEMS:   Review of Systems  Constitutional: Negative for fever, chills and weight loss.  HENT: Negative for ear discharge, ear pain and nosebleeds.   Eyes: Negative for blurred vision, pain and discharge.  Respiratory: Positive for shortness of breath. Negative for sputum production, wheezing and stridor.   Cardiovascular: Positive for palpitations. Negative for chest pain, orthopnea and PND.  Gastrointestinal: Negative for nausea, vomiting, abdominal pain and diarrhea.  Genitourinary: Negative for urgency and frequency.  Musculoskeletal: Negative for back pain and joint pain.  Neurological: Positive for weakness. Negative for sensory change, speech change and focal weakness.  Psychiatric/Behavioral: Negative for depression and hallucinations. The patient is not nervous/anxious.   All other systems reviewed and are negative.  Tolerating Diet:yes Tolerating PT: not needed  DRUG ALLERGIES:  No Known Allergies  VITALS:  Blood pressure 139/94, pulse 66, temperature 98 F (36.7 C), temperature source Oral, resp. rate 23, height 6' (1.829 m), weight 114 kg (251 lb 5.2 oz), SpO2 95 %.  PHYSICAL EXAMINATION:   Physical Exam  GENERAL:  60 y.o.-year-old patient lying in the bed with no acute distress. Obese EYES: Pupils equal, round, reactive to light and accommodation. No scleral icterus. Extraocular muscles intact.  HEENT: Head atraumatic, normocephalic. Oropharynx and nasopharynx clear.  NECK:  Supple, no jugular venous distention. No  thyroid enlargement, no tenderness.  LUNGS: Normal breath sounds bilaterally, no wheezing, rales, rhonchi. No use of accessory muscles of respiration.  CARDIOVASCULAR: S1, S2 normal. No murmurs, rubs, or gallops. Irregular heart rhythm ABDOMEN: Soft, nontender, nondistended. Bowel sounds present. No organomegaly or mass.  EXTREMITIES: No cyanosis, clubbing or edema b/l.    NEUROLOGIC: Cranial nerves II through XII are intact. No focal Motor or sensory deficits b/l.   PSYCHIATRIC:  patient is alert and oriented x 3.  SKIN: No obvious rash, lesion, or ulcer.   LABORATORY PANEL:  CBC  Recent Labs Lab 08/12/15 0351  WBC 10.3  HGB 14.1  HCT 41.2  PLT 199    Chemistries   Recent Labs Lab 08/11/15 1008 08/12/15 0351  NA 137 139  K 3.8 3.8  CL 105 109  CO2 22 22  GLUCOSE 181* 124*  BUN 17 20  CREATININE 0.99 1.01  CALCIUM 8.6* 8.2*  MG 1.9  --    Cardiac Enzymes  Recent Labs Lab 08/11/15 1920  TROPONINI <0.03   RADIOLOGY:  Ct Angio Chest Pe W/cm &/or Wo Cm  08/11/2015  CLINICAL DATA:  Tachycardia and bilateral lower extremity swelling. EXAM: CT ANGIOGRAPHY CHEST WITH CONTRAST TECHNIQUE: Multidetector CT imaging of the chest was performed using the standard protocol during bolus administration of intravenous contrast. Multiplanar CT image reconstructions and MIPs were obtained to evaluate the vascular anatomy. CONTRAST:  75 cc Isovue 370 intravenous COMPARISON:  None. FINDINGS: Mediastinum/Lymph Nodes: Mild cardiomegaly. No pericardial effusion. Non-opacified aorta without acute finding. Motion degraded study, best obtainable in this patient with shortness of breath. There is no indication of pulmonary embolism. Small fatty Bochdalek's hernia on the  left. Lungs/Pleura: Diffuse septal thickening with small borderline moderate bilateral layering pleural effusion. Patchy ground-glass opacities in the bilateral lungs is consistent with alveolar edema in this clinical setting. Diffuse  airway thickening is considered congestive. Upper abdomen: No acute findings. Musculoskeletal: No chest wall mass or suspicious bone lesions identified. Review of the MIP images confirms the above findings. IMPRESSION: 1. CHF. 2. Motion degraded CTA without evidence of pulmonary embolism. Electronically Signed   By: Monte Fantasia M.D.   On: 08/11/2015 13:10   Dg Chest Portable 1 View  08/11/2015  CLINICAL DATA:  Tachycardia. EXAM: PORTABLE CHEST 1 VIEW COMPARISON:  None. FINDINGS: Cardiopericardial enlargement. Vascular pedicle widening and diffuse interstitial coarsening with Kerley lines. No asymmetric consolidation. Negative aortic contours for technique. No acute osseous finding. Small cervical ribs. IMPRESSION: CHF pattern. Electronically Signed   By: Monte Fantasia M.D.   On: 08/11/2015 10:42   ASSESSMENT AND PLAN:  Jeffrey Ortiz is a 60 y.o. male with a known history of Hypertension, hyperlipidemia and diabetes. 2 weeks ago he stated he woke up at 3:30 in the morning with some acid reflux that went up into his nose and mouth and he thinks he may have aspirated.  He's felt palpitations for at least the last 7 days and shortness of breath.  In the ER he was found to be in rapid atrial flutter and numerous doses of IV medications were given without response. 2 doses of adenosine, 1 dose of Cardizem and 2 doses of metoprolol were given. Hospitalist services were contacted for further evaluation.  1. Atrial flutter with rapid ventricular response. Received 2 doses of adenosine, 1 dose of IV Cardizem and 2 doses of oral Cardizem.  -On IV cardizem gtt -po metoprolol and digoxin -seen by Dr Caryl Comes and recommends TEE with CV -on po xarelto  2. Acute systolic congestive heart failure -lasix IV -monitor I and O -echo showed EF 25-30% with diffuse hypokinesis  3. Accelerated hypertension -cont current meds  4. Hyperlipidemia  -intolerant to statins -start zetia  5. Type 2 diabetes. Hold  Glucophage since receive IV contrast. Put on sliding scale.  Case discussed with Care Management/Social Worker. Management plans discussed with the patient, family and they are in agreement.  CODE STATUS: full DVT Prophylaxis: xarelto TOTAL critical TIME TAKING CARE OF THIS PATIENT: 30 minutes.  >50% time spent on counselling and coordination of care  D/C DEPENDING ON CLINICAL CONDITION.  Note: This dictation was prepared with Dragon dictation along with smaller phrase technology. Any transcriptional errors that result from this process are unintentional.  Jeffrey Ortiz M.D on 08/12/2015 at 5:15 PM  Between 7am to 6pm - Pager - [phone]  After 6pm go to [email or web address] - password [email or web address] [facility]  Office  [phone]  CC: Primary care physician; Deeann Cree, MD

## 2015-08-12 NOTE — Consult Note (Signed)
[place] for rivaroxaban  Indication: atrial flutter  No Known Allergies  Patient Measurements: Height: 6' (182.9 cm) Weight: 251 lb 5.2 oz (114 kg) IBW/kg (Calculated) : 77.6 Heparin Dosing Weight:   Vital Signs: Temp: 98.2 F (36.8 C) (06/27 0500) Temp Source: Oral (06/27 0500) BP: 120/101 mmHg (06/27 0900) Pulse Rate: 137 (06/27 0900)  Labs:  Recent Labs  08/11/15 1008 08/11/15 1506 08/11/15 1920 08/12/15 0351  HGB 15.6  --   --  14.1  HCT 44.7  --   --  41.2  PLT 219  --   --  199  CREATININE 0.99  --   --  1.01  TROPONINI <0.03 0.03 <0.03  --     Estimated Creatinine Clearance: 104 mL/min (by C-G formula based on Cr of 1.01).   Medical History: Past Medical History  Diagnosis Date  . Essential hypertension   . Diabetes mellitus without complication ([place])   . GERD (gastroesophageal reflux disease)   . Overweight   . Hyperlipidemia     Medications:  Scheduled:  . digoxin  0.25 mg Oral Daily  . ezetimibe  10 mg Oral Daily  . furosemide  40 mg Intravenous BID  . insulin aspart  0-5 Units Subcutaneous QHS  . insulin aspart  0-9 Units Subcutaneous TID WC  . metoprolol tartrate  25 mg Oral TID  . rivaroxaban  20 mg Oral Q supper  . sodium chloride flush  3 mL Intravenous Q12H    Assessment: Pharmacy consulted for rivaroxaban dosing for 59 yo male presenting with atrial flutter. Patient's CHA2DS2VASc = 2 (HTN/DM) and plan includes TEE with cardioversion.   Goal of Therapy:   Monitor platelets by anticoagulation protocol: Yes   Plan:  Will continue patient on rivaroxaban 20mg  daily with dinner.   Pharmacy will continue to monitor and adjust per protocol.   Currie Paris   08/12/2015,9:13 AM

## 2015-08-12 NOTE — Care Management (Signed)
Met with patient at bedside. He states he does have insurance with [employer]. He didn't bring the card with him. Policy effective [id number]. Patient independent, active and works full time at a [employer]. New atrial fib/flutter. Cardioversion planned for Wed at 2 PM. He is to be discharged on xarelto, coupon card given. Current with PCP but patient can not recall the name. No home health needs identified.

## 2015-08-13 ENCOUNTER — Encounter
Admission: EM | Disposition: A | Discharge status: Home / Self Care | Payer: Self-pay | Source: Home / Self Care | Attending: Internal Medicine

## 2015-08-13 ENCOUNTER — Inpatient Hospital Stay: Payer: MEDICAID | Admitting: Certified Registered"

## 2015-08-13 ENCOUNTER — Other Ambulatory Visit: Payer: Self-pay

## 2015-08-13 ENCOUNTER — Inpatient Hospital Stay
Admit: 2015-08-13 | Discharge: 2015-08-13 | Disposition: A | Discharge status: Home / Self Care | Payer: MEDICAID | Attending: Cardiovascular Disease | Admitting: Cardiovascular Disease

## 2015-08-13 ENCOUNTER — Encounter: Payer: Self-pay | Admitting: Anesthesiology

## 2015-08-13 DIAGNOSIS — I483 Typical atrial flutter: Principal | ICD-10-CM

## 2015-08-13 DIAGNOSIS — I480 Paroxysmal atrial fibrillation: Secondary | ICD-10-CM | POA: Insufficient documentation

## 2015-08-13 HISTORY — PX: TEE WITHOUT CARDIOVERSION: SHX5443

## 2015-08-13 LAB — GLUCOSE, CAPILLARY
GLUCOSE-CAPILLARY: 136 mg/dL — AB (ref 65–99)
GLUCOSE-CAPILLARY: 166 mg/dL — AB (ref 65–99)
Glucose-Capillary: 141 mg/dL — ABNORMAL HIGH (ref 65–99)
Glucose-Capillary: 121 mg/dL — ABNORMAL HIGH (ref 65–99)

## 2015-08-13 SURGERY — ECHOCARDIOGRAM, TRANSESOPHAGEAL
Anesthesia: General

## 2015-08-13 MED ORDER — FENTANYL CITRATE (PF) 100 MCG/2ML IJ SOLN
INTRAMUSCULAR | Status: AC | PRN
  Administered 2015-08-13: 50 ug via INTRAVENOUS
  Administered 2015-08-13: 25 ug via INTRAVENOUS
  Administered 2015-08-13: 50 ug via INTRAVENOUS
  Administered 2015-08-13 (×2): 25 ug via INTRAVENOUS

## 2015-08-13 MED ORDER — SODIUM CHLORIDE FLUSH 0.9 % IV SOLN
INTRAVENOUS | Status: AC
  Administered 2015-08-13: 10 mL
  Filled 2015-08-13: qty 10

## 2015-08-13 MED ORDER — BUTAMBEN-TETRACAINE-BENZOCAINE 2-2-14 % EX AERO
INHALATION_SPRAY | CUTANEOUS | Status: AC
  Filled 2015-08-13: qty 20

## 2015-08-13 MED ORDER — MIDAZOLAM HCL 5 MG/5ML IJ SOLN
INTRAMUSCULAR | Status: AC | PRN
Start: 2015-08-13 — End: 2015-08-13
  Administered 2015-08-13 (×4): 1 mg via INTRAVENOUS

## 2015-08-13 MED ORDER — AMIODARONE HCL 200 MG PO TABS
400.0000 mg | ORAL_TABLET | Freq: Two times a day (BID) | ORAL | Status: DC
  Administered 2015-08-13 (×2): 400 mg via ORAL
  Filled 2015-08-13 (×2): qty 2

## 2015-08-13 MED ORDER — FENTANYL CITRATE (PF) 100 MCG/2ML IJ SOLN
INTRAMUSCULAR | Status: AC
  Filled 2015-08-13: qty 4

## 2015-08-13 MED ORDER — PROPOFOL 10 MG/ML IV BOLUS
INTRAVENOUS | Status: DC | PRN
Start: 2015-08-13 — End: 2015-08-13
  Administered 2015-08-13: 20 mg via INTRAVENOUS
  Administered 2015-08-13: 30 mg via INTRAVENOUS

## 2015-08-13 MED ORDER — LISINOPRIL 10 MG PO TABS
10.0000 mg | ORAL_TABLET | Freq: Every day | ORAL | Status: DC
Start: 2015-08-14 — End: 2015-08-15
  Filled 2015-08-13 (×2): qty 1

## 2015-08-13 MED ORDER — MIDAZOLAM HCL 5 MG/5ML IJ SOLN
INTRAMUSCULAR | Status: AC
  Filled 2015-08-13: qty 10

## 2015-08-13 MED ORDER — SODIUM CHLORIDE 0.9 % IV SOLN
INTRAVENOUS | Status: DC | PRN
  Administered 2015-08-13: 15:00:00 via INTRAVENOUS

## 2015-08-13 MED ORDER — LIDOCAINE VISCOUS 2 % MT SOLN
OROMUCOSAL | Status: AC
  Filled 2015-08-13: qty 15

## 2015-08-13 MED ORDER — CARVEDILOL 12.5 MG PO TABS
12.5000 mg | ORAL_TABLET | Freq: Two times a day (BID) | ORAL | Status: DC
  Administered 2015-08-13 – 2015-08-15 (×4): 12.5 mg via ORAL
  Filled 2015-08-13 (×2): qty 1
  Filled 2015-08-13: qty 2
  Filled 2015-08-13: qty 1

## 2015-08-13 NOTE — Progress Notes (Signed)
Return from special procedures. Afib 70-80. Denies pain.  Alert. VSS.  Cardizem gtt off .  To start amiodarone po, coreg and xarelto.

## 2015-08-13 NOTE — Anesthesia Postprocedure Evaluation (Signed)
Anesthesia Post Note  Patient: Jeffrey Ortiz  Procedure(s) Performed: Procedure(s) (LRB): TRANSESOPHAGEAL ECHOCARDIOGRAM (TEE) and cardioversion (N/A)  Patient location during evaluation: Other Anesthesia Type: General Level of consciousness: awake and alert Pain management: pain level controlled Vital Signs Assessment: post-procedure vital signs reviewed and stable Respiratory status: spontaneous breathing, nonlabored ventilation, respiratory function stable and patient connected to nasal cannula oxygen Cardiovascular status: blood pressure returned to baseline and stable Postop Assessment: no signs of nausea or vomiting Anesthetic complications: no    Last Vitals:  Filed Vitals:   08/13/15 1505 08/13/15 1516  BP: 106/81 94/74  Pulse: 95 140  Temp: 37.5 C   Resp: 18 16    Last Pain:  Filed Vitals:   08/13/15 1516  PainSc: 0-No pain                 Dineen Conradt S

## 2015-08-13 NOTE — Progress Notes (Signed)
Patient transferred from ICU. Alert and oriented. No complaints of pain. Heart rate currently in the 80's, afib. Tele verified with CCU RN and NT. Family is at bedside. Education given on all new medications as well as afib. Patient states he feels much better than when he got here and according to the patient he has already lost about 30 pounds since admission. No needs at this time, will continue to monitor.

## 2015-08-13 NOTE — Procedures (Addendum)
Transesophageal Echocardiogram :  Indication: Atrial flutter (typical) with rapid ventricular response Requesting/ordering  physician: Jaylin Benzel, tim  Procedure: Benzocaine spray x2 and 2 mls x 2 of viscous lidocaine were given orally to provide local anesthesia to the oropharynx. The patient was positioned supine on the left side, bite block provided. The patient was moderately sedated with the doses of versed and fentanyl as detailed below.  Using digital technique an omniplane probe was advanced into the distal esophagus without incident.   Moderate sedation: 1. Sedation used:  Versed: 4 mg IV, Fentanyl: 100 ug IV 2. Sedation time of 45 minutes 3. I was face to face during this time  See report in EPIC  for complete details: In brief, transgastric imaging revealed normal LV function with  moderate diffuse hypokinesis,and no mural apical thrombus.  .  Estimated ejection fraction was 30%.  Right sided cardiac chambers were normal with no evidence of pulmonary hypertension.  Imaging of the septum showed no ASD or VSD Bubble study was negative for shunt 2D and color flow confirmed no PFO  The LA was well visualized in orthogonal views.  There was very mild  spontaneous contrast and no thrombus in the LA and LA appendage   The descending thoracic aorta had no  mural aortic debris with no evidence of aneurysmal dilation or disection  mild aortic plaquing in the aortic arch   Patient then underwent cardioversion as detailed below    Cardioversion procedure note For atrial flutter, typical  Procedure Details:  Consent: Risks of procedure as well as the alternatives and risks of each were explained to the (patient/caregiver). Consent for procedure obtained.  Time Out: Verified patient identification, verified procedure, site/side was marked, verified correct patient position, special equipment/implants available, medications/allergies/relevent history reviewed, required imaging and test  results available. Performed  Patient placed on cardiac monitor, pulse oximetry, supplemental oxygen as necessary.  Sedation given: propofol IV, Dr. Marcello Moores Pacer pads placed anterior and posterior chest.   Cardioverted 1 time(s).  Cardioverted to normal sinus rhythm briefly at  150J. Synchronized biphasic Slip back into atrial fibrillation, converted again at 200 J Briefly in normal sinus rhythm before going back into atrial fibrillation Converted again at 200 J, again briefly normal sinus rhythm before going back into atrial fibrillation Rate 80 bpm, irregular   Evaluation: Findings: Post procedure EKG shows: NSR Complications: None Patient did tolerate procedure well.  Time Spent Directly with the Patient: 45 minutes  Long discussion with family concerning the outcome of above Discussed with hospitalist, Dr. Gerilyn Nestle plan on discontinuing diltiazem infusion, start carvedilol 12.5  mg twice a day with amiodarone 400 mg twice a day, Lasix, xarelto  Esmond Plants, M.D., Ph.D. [facility]

## 2015-08-13 NOTE — Progress Notes (Signed)
Transferred to special procedures for TEE and Cardioversion. Report given to specials RN. Family here and in waiting room.

## 2015-08-13 NOTE — Progress Notes (Signed)
Transfer to room 239 per [facility].  Family with pt. Report given to 2A nurse prior. Afib 70-80 on monitor.  No chestpain. No sHob.

## 2015-08-13 NOTE — Transfer of Care (Signed)
Immediate Anesthesia Transfer of Care Note  Patient: Jeffrey Ortiz  Procedure(s) Performed: Procedure(s): TRANSESOPHAGEAL ECHOCARDIOGRAM (TEE) and cardioversion (N/A)  Patient Location: spu  Anesthesia Type:General  Level of Consciousness: awake  Airway & Oxygen Therapy: Patient Spontanous Breathing and Patient connected to nasal cannula oxygen  Post-op Assessment: Report given to RN and Post -op Vital signs reviewed and stable  Post vital signs: Reviewed  Last Vitals:  Filed Vitals:   08/13/15 1504 08/13/15 1505  BP:  106/81  Pulse:  95  Temp:  37.5 C  Resp: 16 18    Last Pain:  Filed Vitals:   08/13/15 1507  PainSc: 0-No pain         Complications: No apparent anesthesia complications

## 2015-08-13 NOTE — OR Nursing (Signed)
Electrical cardioversion done by Dr. Rockey Situ at 150 joulse went into artial fib from atrial flutter, shocked a 2nd time at 200 joules with no change in rhythm and shocked a 3rd time at 200 joules and remains in atrial fib. MD to place on a different ant-iarrhythma drug and may attempt to cardiovert at another time.

## 2015-08-13 NOTE — Plan of Care (Signed)
Problem: Education: Goal: Knowledge of [facility] Education information/materials will improve Outcome: Progressing Discussed purpose and action of Coreg, Lisinopril, lasix, xarelto, amiodarone.  Attachments to all new meds added to discharge instruction.  He knows he is to be good regarding meds, follow up, diet and referral for sleep study.  Problem: Health Behavior/Discharge Planning: Goal: Ability to manage health-related needs will improve Outcome: Progressing Plans to go home with wife and 2 sons  Problem: Pain Managment: Goal: General experience of comfort will improve Outcome: Progressing Pain free  Problem: Physical Regulation: Goal: Ability to maintain clinical measurements within normal limits will improve Outcome: Progressing VSS.  Aflutter 110-140 earlier today.  After return from attempted cardioversion he is in Afib.  HR now 70-80.  Amiodarone po started and cardizem gtt off.  Problem: Tissue Perfusion: Goal: Risk factors for ineffective tissue perfusion will decrease Outcome: Progressing On xarelto.

## 2015-08-13 NOTE — Progress Notes (Signed)
*  PRELIMINARY RESULTS* Echocardiogram 2D Echocardiogram has been performed.  Sherrie Sport 08/13/2015, 3:02 PM

## 2015-08-13 NOTE — Anesthesia Preprocedure Evaluation (Addendum)
Anesthesia Evaluation  Patient identified by MRN, date of birth, ID band Patient awake    Reviewed: Allergy & Precautions, NPO status , Patient's Chart, lab work & pertinent test results, reviewed documented beta blocker date and time   Airway Mallampati: III  TM Distance: >3 FB     Dental  (+) Chipped   Pulmonary shortness of breath,           Cardiovascular hypertension, Pt. on medications and Pt. on home beta blockers +CHF  + dysrhythmias Atrial Fibrillation      Neuro/Psych    GI/Hepatic GERD  Controlled,  Endo/Other  diabetes, Type 2  Renal/GU      Musculoskeletal   Abdominal   Peds  Hematology   Anesthesia Other Findings HR 140's.  Reproductive/Obstetrics                            Anesthesia Physical Anesthesia Plan  ASA: III  Anesthesia Plan: General   Post-op Pain Management:    Induction: Intravenous  Airway Management Planned: Nasal Cannula  Additional Equipment:   Intra-op Plan:   Post-operative Plan:   Informed Consent: I have reviewed the patients History and Physical, chart, labs and discussed the procedure including the risks, benefits and alternatives for the proposed anesthesia with the patient or authorized representative who has indicated his/her understanding and acceptance.     Plan Discussed with: CRNA  Anesthesia Plan Comments:         Anesthesia Quick Evaluation

## 2015-08-13 NOTE — Consult Note (Signed)
[employer] for rivaroxaban  Indication: atrial flutter  No Known Allergies  Patient Measurements: Height: 6' (182.9 cm) Weight: 251 lb 5.2 oz (114 kg) IBW/kg (Calculated) : 77.6 Heparin Dosing Weight:   Vital Signs: Temp: 97.6 F (36.4 C) (06/28 1100) Temp Source: Oral (06/28 1100) BP: 129/85 mmHg (06/28 1100) Pulse Rate: 83 (06/28 1100)  Labs:  Recent Labs  08/11/15 1008 08/11/15 1506 08/11/15 1920 08/12/15 0351  HGB 15.6  --   --  14.1  HCT 44.7  --   --  41.2  PLT 219  --   --  199  CREATININE 0.99  --   --  1.01  TROPONINI <0.03 0.03 <0.03  --     Estimated Creatinine Clearance: 104 mL/min (by C-G formula based on Cr of 1.01).  Assessment: Pharmacy consulted for rivaroxaban dosing for 61 yo male presenting with atrial flutter. Patient's CHA2DS2VASc = 2 (HTN/DM) and plan includes TEE with cardioversion.   Goal of Therapy:   Monitor platelets by anticoagulation protocol: Yes   Plan:  Will continue patient on rivaroxaban 20mg  daily with dinner.   Pharmacy will continue to monitor and adjust per protocol.   Currie Paris   08/13/2015,1:00 PM

## 2015-08-13 NOTE — Clinical Documentation Improvement (Signed)
Cardiology Internal Medicine  Can the diagnosis of CHF be further specified? Please document findings in next progress note. Thank you!    Acuity - Acute, Chronic, Acute on Chronic   Type - Systolic, Diastolic, Systolic and Diastolic  Other  Clinically Undetermined  Document any associated diagnoses/conditions  Supporting Information:  ECHO performed on 6/26 reveals EF of 25 to 30% with diffuse hypokinesis, systolic function is severely reduced  BNP = 297  Being treated with IV Lasix 40 mg IV twice daily and PO Lopressor 25 mg 3 times daily  Please exercise your independent, professional judgment when responding. A specific answer is not anticipated or expected.  Thank You,  Norm Parcel RN, BSN, [place] Clinical Documentation Specialist [facility] [phone]; cell [phone]

## 2015-08-13 NOTE — Progress Notes (Signed)
    Patient is for TEE/DCCV this afternoon at 2 PM with Dr. Rockey Situ, MD. On telemetry he is currently in atrial flutter with rates in the 70's bpm. has been in atrial flutter with rates in the 140's bpm overnight and into this morning. Echo showed EF 25-30%, RWMA could not be excluded. Study was not technically sufficient to allow for LV diastolic function. Mild MR. Mild dilation of LA. RV systolic function was normal. PASP could not be estimated. Once/if sinus rhythm is restored EP has recommended outpatient follow up for possible atrial flutter ablation. Recommend repeat echo 1 month after restoration of sinus rhythm to assess for possible improvement in LVSF. If his EF remains reduced he would need R&LHC as an outpatient. For now continue Xarelto 20 mg q dinner and Lopressor 25 mg tid. May need to hold digoxin in an effort to avoid significant bradycardia s/p DCCV.    CrCl 128.38 mL/min based on bmet taken 08/12/15.   Christell Faith, PA-C 08/13/2015 7:31 AM

## 2015-08-14 ENCOUNTER — Other Ambulatory Visit: Payer: Self-pay | Admitting: Cardiovascular Disease

## 2015-08-14 DIAGNOSIS — I5021 Acute systolic (congestive) heart failure: Secondary | ICD-10-CM | POA: Insufficient documentation

## 2015-08-14 DIAGNOSIS — I480 Paroxysmal atrial fibrillation: Secondary | ICD-10-CM

## 2015-08-14 LAB — GLUCOSE, CAPILLARY
GLUCOSE-CAPILLARY: 145 mg/dL — AB (ref 65–99)
Glucose-Capillary: 148 mg/dL — ABNORMAL HIGH (ref 65–99)
Glucose-Capillary: 313 mg/dL — ABNORMAL HIGH (ref 65–99)
Glucose-Capillary: 114 mg/dL — ABNORMAL HIGH (ref 65–99)

## 2015-08-14 MED ORDER — AMIODARONE HCL IN DEXTROSE 360-4.14 MG/200ML-% IV SOLN
60.0000 mg/h | INTRAVENOUS | Status: AC
  Administered 2015-08-14: 60 mg/h via INTRAVENOUS
  Filled 2015-08-14: qty 200

## 2015-08-14 MED ORDER — DILTIAZEM HCL 25 MG/5ML IV SOLN
10.0000 mg | Freq: Once | INTRAVENOUS | Status: AC
Start: 2015-08-14 — End: 2015-08-14
  Administered 2015-08-14: 10 mg via INTRAVENOUS
  Filled 2015-08-14: qty 5

## 2015-08-14 MED ORDER — AMIODARONE LOAD VIA INFUSION
150.0000 mg | Freq: Once | INTRAVENOUS | Status: AC
  Administered 2015-08-14: 150 mg via INTRAVENOUS
  Filled 2015-08-14: qty 83.34

## 2015-08-14 MED ORDER — AMIODARONE HCL IN DEXTROSE 360-4.14 MG/200ML-% IV SOLN
60.0000 mg/h | INTRAVENOUS | Status: DC
  Administered 2015-08-14 – 2015-08-15 (×4): 60 mg/h via INTRAVENOUS
  Filled 2015-08-14 (×11): qty 200

## 2015-08-14 MED ORDER — DILTIAZEM HCL 25 MG/5ML IV SOLN
10.0000 mg | Freq: Once | INTRAVENOUS | Status: AC
  Administered 2015-08-14: 10 mg via INTRAVENOUS

## 2015-08-14 MED ORDER — DILTIAZEM HCL 25 MG/5ML IV SOLN
INTRAVENOUS | Status: AC
  Administered 2015-08-14: 10 mg
  Filled 2015-08-14: qty 5

## 2015-08-14 MED ORDER — AMIODARONE HCL 200 MG PO TABS
400.0000 mg | ORAL_TABLET | Freq: Two times a day (BID) | ORAL | Status: DC
  Administered 2015-08-14 – 2015-08-15 (×3): 400 mg via ORAL
  Filled 2015-08-14 (×3): qty 2

## 2015-08-14 NOTE — Progress Notes (Signed)
Initial Heart Failure Clinic appointment scheduled for August 25, 2015 at 9:00am. Thank you.

## 2015-08-14 NOTE — Progress Notes (Addendum)
Patient ID: Lantz Gasper, male   DOB: 1956-01-04, 60 y.o.   MRN: [id number] [place] [facility] [place] NAME: Samantha Pietrzyk    MR#:  [id number]  DATE OF BIRTH:  12-04-1955  SUBJECTIVE:   Came in with increasing shortness of breath hypoxia was found to be in rapid A. fib flutter in the ICU getting IV Cardizem drip. Denies any chest pain. Continues with HR 140"s this am. Received po meds early REVIEW OF SYSTEMS:   Review of Systems  Constitutional: Negative for fever, chills and weight loss.  HENT: Negative for ear discharge, ear pain and nosebleeds.   Eyes: Negative for blurred vision, pain and discharge.  Respiratory: Positive for shortness of breath. Negative for sputum production, wheezing and stridor.   Cardiovascular: Positive for palpitations. Negative for chest pain, orthopnea and PND.  Gastrointestinal: Negative for nausea, vomiting, abdominal pain and diarrhea.  Genitourinary: Negative for urgency and frequency.  Musculoskeletal: Negative for back pain and joint pain.  Neurological: Positive for weakness. Negative for sensory change, speech change and focal weakness.  Psychiatric/Behavioral: Negative for depression and hallucinations. The patient is not nervous/anxious.   All other systems reviewed and are negative.  Tolerating Diet:yes Tolerating PT: not needed  DRUG ALLERGIES:  No Known Allergies  VITALS:  Blood pressure 106/69, pulse 72, temperature 98 F (36.7 C), temperature source Oral, resp. rate 20, height 6' (1.829 m), weight 103.057 kg (227 lb 3.2 oz), SpO2 96 %.  PHYSICAL EXAMINATION:   Physical Exam  GENERAL:  60 y.o.-year-old patient lying in the bed with no acute distress. Obese EYES: Pupils equal, round, reactive to light and accommodation. No scleral icterus. Extraocular muscles intact.  HEENT: Head atraumatic, normocephalic. Oropharynx and nasopharynx clear.  NECK:  Supple, no jugular venous distention.  No thyroid enlargement, no tenderness.  LUNGS: Normal breath sounds bilaterally, no wheezing, rales, rhonchi. No use of accessory muscles of respiration.  CARDIOVASCULAR: S1, S2 normal. No murmurs, rubs, or gallops. Irregular heart rhythm ABDOMEN: Soft, nontender, nondistended. Bowel sounds present. No organomegaly or mass.  EXTREMITIES: No cyanosis, clubbing or edema b/l.    NEUROLOGIC: Cranial nerves II through XII are intact. No focal Motor or sensory deficits b/l.   PSYCHIATRIC:  patient is alert and oriented x 3.  SKIN: No obvious rash, lesion, or ulcer.   LABORATORY PANEL:  CBC  Recent Labs Lab 08/12/15 0351  WBC 10.3  HGB 14.1  HCT 41.2  PLT 199    Chemistries   Recent Labs Lab 08/11/15 1008 08/12/15 0351  NA 137 139  K 3.8 3.8  CL 105 109  CO2 22 22  GLUCOSE 181* 124*  BUN 17 20  CREATININE 0.99 1.01  CALCIUM 8.6* 8.2*  MG 1.9  --    Cardiac Enzymes  Recent Labs Lab 08/11/15 1920  TROPONINI <0.03   RADIOLOGY:  No results found. ASSESSMENT AND PLAN:  Tarrell Leadbeater is a 60 y.o. male with a known history of Hypertension, hyperlipidemia and diabetes. 2 weeks ago he stated he woke up at 3:30 in the morning with some acid reflux that went up into his nose and mouth and he thinks he may have aspirated.  He's felt palpitations for at least the last 7 days and shortness of breath.  In the ER he was found to be in rapid atrial flutter and numerous doses of IV medications were given without response. 2 doses of adenosine, 1 dose of Cardizem and 2 doses of metoprolol  were given. Hospitalist services were contacted for further evaluation.  1. Atrial flutter with rapid ventricular response. Received 2 doses of adenosine, 1 dose of IV Cardizem and 2 doses of oral Cardizem.  -On IV cardizem gtt -po metoprolol and digoxin -seen by Dr Caryl Comes and recommends TEE with CV -on po xarelto  2. Acute systolic congestive heart failure -lasix IV -monitor I and O -echo showed  EF 25-30% with diffuse hypokinesis  3. Accelerated hypertension -cont current meds  4. Hyperlipidemia  -intolerant to statins -start zetia  5. Type 2 diabetes. Hold Glucophage since receive IV contrast. Put on sliding scale.  Case discussed with Care Management/Social Worker. Management plans discussed with the patient, family and they are in agreement.  CODE STATUS: full DVT Prophylaxis: xarelto TOTAL critical TIME TAKING CARE OF THIS PATIENT: 30 minutes.  >50% time spent on counselling and coordination of care  D/C DEPENDING ON CLINICAL CONDITION.  Note: This dictation was prepared with Dragon dictation along with smaller phrase technology. Any transcriptional errors that result from this process are unintentional.  Kiahna Banghart M.D on 08/13/2015 at 12:10 PM  Between 7am to 6pm - Pager - [phone]  After 6pm go to [email or web address] - password [email or web address] [facility]  Office  [phone]  CC: Primary care physician; Deeann Cree, MD

## 2015-08-14 NOTE — Progress Notes (Signed)
Patient ID: Jeffrey Ortiz, male   DOB: December 31, 1955, 60 y.o.   MRN: [id number] [place] [facility] [place] NAME: Jeffrey Ortiz    MR#:  [id number]  DATE OF BIRTH:  Jul 12, 1955  SUBJECTIVE:   Came in with increasing shortness of breath hypoxia was found to be in rapid A. fib flutter in the ICU getting IV Cardizem drip. Denies any chest pain. Continues with Intermittent elevated heart rate. Patient slipped back into atrial flutter. Denies any chest pain. No shortness of breath. REVIEW OF SYSTEMS:   Review of Systems  Constitutional: Negative for fever, chills and weight loss.  HENT: Negative for ear discharge, ear pain and nosebleeds.   Eyes: Negative for blurred vision, pain and discharge.  Respiratory: Positive for shortness of breath. Negative for sputum production, wheezing and stridor.   Cardiovascular: Positive for palpitations. Negative for chest pain, orthopnea and PND.  Gastrointestinal: Negative for nausea, vomiting, abdominal pain and diarrhea.  Genitourinary: Negative for urgency and frequency.  Musculoskeletal: Negative for back pain and joint pain.  Neurological: Positive for weakness. Negative for sensory change, speech change and focal weakness.  Psychiatric/Behavioral: Negative for depression and hallucinations. The patient is not nervous/anxious.   All other systems reviewed and are negative.  Tolerating Diet:yes Tolerating PT: not needed  DRUG ALLERGIES:  No Known Allergies  VITALS:  Blood pressure 106/69, pulse 72, temperature 98 F (36.7 C), temperature source Oral, resp. rate 20, height 6' (1.829 m), weight 103.057 kg (227 lb 3.2 oz), SpO2 96 %.  PHYSICAL EXAMINATION:   Physical Exam  GENERAL:  60 y.o.-year-old patient lying in the bed with no acute distress. Obese EYES: Pupils equal, round, reactive to light and accommodation. No scleral icterus. Extraocular muscles intact.  HEENT: Head atraumatic, normocephalic.  Oropharynx and nasopharynx clear.  NECK:  Supple, no jugular venous distention. No thyroid enlargement, no tenderness.  LUNGS: Normal breath sounds bilaterally, no wheezing, rales, rhonchi. No use of accessory muscles of respiration.  CARDIOVASCULAR: S1, S2 normal. No murmurs, rubs, or gallops. Irregular heart rhythm ABDOMEN: Soft, nontender, nondistended. Bowel sounds present. No organomegaly or mass.  EXTREMITIES: No cyanosis, clubbing or edema b/l.    NEUROLOGIC: Cranial nerves II through XII are intact. No focal Motor or sensory deficits b/l.   PSYCHIATRIC:  patient is alert and oriented x 3.  SKIN: No obvious rash, lesion, or ulcer.   LABORATORY PANEL:  CBC  Recent Labs Lab 08/12/15 0351  WBC 10.3  HGB 14.1  HCT 41.2  PLT 199    Chemistries   Recent Labs Lab 08/11/15 1008 08/12/15 0351  NA 137 139  K 3.8 3.8  CL 105 109  CO2 22 22  GLUCOSE 181* 124*  BUN 17 20  CREATININE 0.99 1.01  CALCIUM 8.6* 8.2*  MG 1.9  --    Cardiac Enzymes  Recent Labs Lab 08/11/15 1920  TROPONINI <0.03   RADIOLOGY:  No results found. ASSESSMENT AND PLAN:  Jeffrey Ortiz is a 60 y.o. male with a known history of Hypertension, hyperlipidemia and diabetes. 2 weeks ago he stated he woke up at 3:30 in the morning with some acid reflux that went up into his nose and mouth and he thinks he may have aspirated.  He's felt palpitations for at least the last 7 days and shortness of breath.  In the ER he was found to be in rapid atrial flutter and numerous doses of IV medications were given without response. 2 doses of  adenosine, 1 dose of Cardizem and 2 doses of metoprolol were given. Hospitalist services were contacted for further evaluation.  1. Atrial flutter with rapid ventricular response. Received 2 doses of adenosine, 1 dose of IV Cardizem and 2 doses of oral Cardizem.  -Patient now placed on amiodarone per cardiology. -po metoprolol and digoxin -Patient is status post cardioversion on  08/13/2015. He flipped back into atrial flutter. He will he may need repeat cardioversion. -on po xarelto  2. Acute systolic congestive heart failure-new onset -lasix IV -monitor I and O -echo showed EF 25-30% with diffuse hypokinesis  3. Accelerated hypertension -cont current meds  4. Hyperlipidemia  -intolerant to statins -start zetia  5. Type 2 diabetes. Hold Glucophage since receive IV contrast. Put on sliding scale.  Case discussed with Care Management/Social Worker. Management plans discussed with the patient, family and they are in agreement.  CODE STATUS: full DVT Prophylaxis: xarelto TOTAL  TIME TAKING CARE OF THIS PATIENT: 30 minutes.  >50% time spent on counselling and coordination of care  D/C DEPENDING ON CLINICAL CONDITION.  Note: This dictation was prepared with Dragon dictation along with smaller phrase technology. Any transcriptional errors that result from this process are unintentional.  [name] M.D on 08/13/2015 at 12:11 PM  Between 7am to 6pm - Pager - [phone]  After 6pm go to [email or web address] - password [email or web address] [facility]  Office  [phone]  CC: Primary care physician; Deeann Cree, MD

## 2015-08-14 NOTE — Progress Notes (Addendum)
Per Dr. Rockey Situ, give amio bolus 150mg  over 30-40 minutes rather than 10 minutes so patient's BP does not drop. MD also stated to continue drip at 33.3 rate and not to drop it to 16.7 after 6 hours as long as vitals are stable. MD also wants PO amio given as well. Will start drip once received from pharmacy.

## 2015-08-14 NOTE — Progress Notes (Signed)
Heart rate is periodically going down to the 90's and staying for a few minutes, but goes back up to 130's aflutter. Symptoms are still the same. Will continue to closely monitor.

## 2015-08-14 NOTE — Progress Notes (Signed)
Patient's heart rate has been above 130 since 7:20AM. Primary RN not notified, but saw the monitor and called CCMD at [place]. Patient is back in aflutter. Patient states he can tell his heart is beating faster. Christell Faith with cardiology is on floor at this time. Notified that AM BP was a little on the lower side and patient has lisinopril and lasix ordered on top of his PO digoxin and coreg. Dunn stated to hold lasix and lisinopril since patient may need more IV medication for heart rate. PO dig and coreg given this AM. Awaiting new orders for elevated heart rate.

## 2015-08-14 NOTE — Progress Notes (Signed)
Heart rate is consistently staying in the 60's-70's still aflutter. Patient still feels the same. Notified CCMD to call if HR goes below 60.

## 2015-08-14 NOTE — Progress Notes (Signed)
Family and patient are questioning about patient wearing a cpap while he is here. They state "we know he has sleep apnea". Dr. Posey Pronto paged for family and MD stated patient will have to have a sleep study done outpatient in order to have cpap to know what settings patient will need to be on. Also notified Dr. Posey Pronto that multiple pages have been made to Dr. Rockey Situ and Christell Faith with no return call. Patient is stable, but wanted to update cardiology that heart rate still sustains around 130 at times. Dr. Posey Pronto stated she will attempt to call and let them know. BP is stable and patient feels no different than this morning, just states he feels a little congested.

## 2015-08-14 NOTE — Progress Notes (Signed)
Patient: Jeffrey Ortiz / Admit Date: 08/11/2015 / Date of Encounter: 08/14/2015, 9:31 AM   Subjective: Converted back to atrial flutter overnight Periods of variable block Rate in the 140 range   Review of Systems: Review of Systems  Constitutional: Negative.   Respiratory: Positive for shortness of breath.   Cardiovascular: Positive for palpitations.  Gastrointestinal: Negative.   Musculoskeletal: Negative.   Neurological: Negative.   Psychiatric/Behavioral: Negative.   All other systems reviewed and are negative.   Objective: Telemetry: Atrial flutter with rate 140 bpm Physical Exam: Blood pressure 164/100, pulse 69, temperature 98 F (36.7 C), temperature source Oral, resp. rate 20, height 6' (1.829 m), weight 227 lb 3.2 oz (103.057 kg), SpO2 97 %. Body mass index is 30.81 kg/(m^2). Blood pressure 136/104, pulse 57, temperature 98.2 F (36.8 C), temperature source Oral, resp. rate 20, height 6' (1.829 m), weight 251 lb 5.2 oz (114 kg), SpO2 93 %. Body mass index is 34.08 kg/(m^2). General: Well developed, well nourished, in no acute distress. Head: Normocephalic, atraumatic, sclera non-icteric, no xanthomas, nares are without discharge. Neck: Negative for carotid bruits.  Lungs: Clear bilaterally to auscultation without wheezes,+ Scant rales at the bases, Breathing is unlabored. Heart: regular tachy, S1 S2 without murmurs, rubs, or gallops.  Abdomen: Soft, non-tender, non-distended with normoactive bowel sounds. No rebound/guarding. Extremities: No clubbing or cyanosis. No edema. Distal pedal pulses are 2+ and equal bilaterally. Neuro: Alert and oriented X 3. Moves all extremities spontaneously. Psych: Responds to questions appropriately with a normal affect.   Intake/Output Summary (Last 24 hours) at 08/14/15 0931 Last data filed at 08/14/15 0900  Gross per 24 hour  Intake    255 ml  Output   2425 ml  Net  -2170 ml    Inpatient Medications:  . amiodarone   400 mg Oral BID  . carvedilol  12.5 mg Oral BID WC  . digoxin  0.25 mg Oral Daily  . ezetimibe  10 mg Oral Daily  . furosemide  40 mg Intravenous BID  . insulin aspart  0-5 Units Subcutaneous QHS  . insulin aspart  0-9 Units Subcutaneous TID WC  . lisinopril  10 mg Oral Daily  . rivaroxaban  20 mg Oral Q supper   Infusions:    Labs:  Recent Labs  08/11/15 1008 08/12/15 0351  NA 137 139  K 3.8 3.8  CL 105 109  CO2 22 22  GLUCOSE 181* 124*  BUN 17 20  CREATININE 0.99 1.01  CALCIUM 8.6* 8.2*  MG 1.9  --    No results for input(s): AST, ALT, ALKPHOS, BILITOT, PROT, ALBUMIN in the last 72 hours.  Recent Labs  08/11/15 1008 08/12/15 0351  WBC 12.0* 10.3  HGB 15.6 14.1  HCT 44.7 41.2  MCV 93.1 94.0  PLT 219 199    Recent Labs  08/11/15 1008 08/11/15 1506 08/11/15 1920  TROPONINI <0.03 0.03 <0.03   Invalid input(s): POCBNP No results for input(s): HGBA1C in the last 72 hours.   Weights: Filed Weights   08/11/15 1510 08/13/15 1815  Weight: 251 lb 5.2 oz (114 kg) 227 lb 3.2 oz (103.057 kg)     Radiology/Studies:  Ct Angio Chest Pe W/cm &/or Wo Cm  08/11/2015  CLINICAL DATA:  Tachycardia and bilateral lower extremity swelling. EXAM: CT ANGIOGRAPHY CHEST WITH CONTRAST TECHNIQUE: Multidetector CT imaging of the chest was performed using the standard protocol during bolus administration of intravenous contrast. Multiplanar CT image reconstructions and MIPs were obtained to  evaluate the vascular anatomy. CONTRAST:  75 cc Isovue 370 intravenous COMPARISON:  None. FINDINGS: Mediastinum/Lymph Nodes: Mild cardiomegaly. No pericardial effusion. Non-opacified aorta without acute finding. Motion degraded study, best obtainable in this patient with shortness of breath. There is no indication of pulmonary embolism. Small fatty Bochdalek's hernia on the left. Lungs/Pleura: Diffuse septal thickening with small borderline moderate bilateral layering pleural effusion. Patchy  ground-glass opacities in the bilateral lungs is consistent with alveolar edema in this clinical setting. Diffuse airway thickening is considered congestive. Upper abdomen: No acute findings. Musculoskeletal: No chest wall mass or suspicious bone lesions identified. Review of the MIP images confirms the above findings. IMPRESSION: 1. CHF. 2. Motion degraded CTA without evidence of pulmonary embolism. Electronically Signed   By: Monte Fantasia M.D.   On: 08/11/2015 13:10   Dg Chest Portable 1 View  08/11/2015  CLINICAL DATA:  Tachycardia. EXAM: PORTABLE CHEST 1 VIEW COMPARISON:  None. FINDINGS: Cardiopericardial enlargement. Vascular pedicle widening and diffuse interstitial coarsening with Kerley lines. No asymmetric consolidation. Negative aortic contours for technique. No acute osseous finding. Small cervical ribs. IMPRESSION: CHF pattern. Electronically Signed   By: Monte Fantasia M.D.   On: 08/11/2015 10:42     Assessment and Plan  60 y.o. male   Atrial flutter converted back to atrial flutter overnight, rate 140 bpm this morning  long discussion with patient and his family this morning Discussed case with Dr. Caryl Comes   will start amiodarone IV infusion with bolus, also continue by mouth amiodarone for aggressive load Schedule for repeat cardioversion tomorrow if no dramatic improvement in rate or rhythm on amiodarone If he has dramatic improvement in rate or rhythm, potentially could cancel cardioversion Would continue Lasix, hold ACE inhibitor   continue digoxin   Sleep apnea Major issue, will need to have outpatient sleep study ASAP Likely contributing to underlying arrhythmia as illustrated last night, converting back to atrial flutter  Acute Systolic CHF Likely tachycardia mediated We'll try to restore sinus rhythm ASAP Continue Lasix Once back in sinus rhythm with improved blood pressure, will add ACE inhibitor or entresto and aldactone, Continue digoxin   Total encounter time  more than 25 minutes  Greater than 50% was spent in counseling and coordination of care with the patient    Signed, Esmond Plants, MD, Ph.D. [facility] 08/14/2015, 9:31 AM

## 2015-08-14 NOTE — Progress Notes (Signed)
Patient is still stable on amiodarone drip. Heart will maintain around 70 for a period of time and then goes back up to 130 for a period of time. Has been going back and forth since being on drip today. Dr. Rockey Situ paged to update, but have not received call back yet.

## 2015-08-14 NOTE — Progress Notes (Signed)
Dr. Rockey Situ stated this AM he wanted patient to have lasix so he does not get more short of breath with aflutter. Will hold coreg this evening, but give lasix. BP is stable but runs low [id number] systolic at times. Heart rate is now staying more consistently in the 70's.

## 2015-08-14 NOTE — Progress Notes (Signed)
Pt's HR has sustained in the 130s for approx 20min. MD Dr. Marcille Blanco notified. Orders given for one time dose of IVP diltiazem. RN will administer and continue to monitor. Rachael Fee, RN

## 2015-08-14 NOTE — Progress Notes (Signed)
Amiodarone drip started. Bolus completed and vitals are stable. Patient's BP is a little lower than baseline, but is staying stable. Patient states he feels a little clammy and dizzy. Bed alarm is activated and patient will use urinal in bed for now. Patient instructed to call RN if symptoms change at all.

## 2015-08-14 NOTE — Progress Notes (Signed)
Pt was NSR maintaining a HR 60s-80s at the start of this RN shift (1900), converted back to a fib at 2130, rates ranging from 80s-100s. Amiodarone drip still running at 33.63mL/hr per day shift RN report and Dr. Laqueta Due orders. RN will continue to monitor. Rachael Fee, RN

## 2015-08-15 ENCOUNTER — Encounter
Admission: EM | Disposition: A | Discharge status: Home / Self Care | Payer: Self-pay | Source: Home / Self Care | Attending: Internal Medicine

## 2015-08-15 ENCOUNTER — Other Ambulatory Visit: Payer: Self-pay

## 2015-08-15 LAB — GLUCOSE, CAPILLARY
GLUCOSE-CAPILLARY: 166 mg/dL — AB (ref 65–99)
GLUCOSE-CAPILLARY: 247 mg/dL — AB (ref 65–99)

## 2015-08-15 LAB — BASIC METABOLIC PANEL
ANION GAP: 9 (ref 5–15)
BUN: 23 mg/dL — ABNORMAL HIGH (ref 6–20)
CALCIUM: 8.7 mg/dL — AB (ref 8.9–10.3)
CO2: 28 mmol/L (ref 22–32)
Chloride: 98 mmol/L — ABNORMAL LOW (ref 101–111)
Creatinine, Ser: 1.25 mg/dL — ABNORMAL HIGH (ref 0.61–1.24)
Glucose, Bld: 165 mg/dL — ABNORMAL HIGH (ref 65–99)
Potassium: 3.6 mmol/L (ref 3.5–5.1)
Sodium: 135 mmol/L (ref 135–145)

## 2015-08-15 LAB — MAGNESIUM: MAGNESIUM: 2.1 mg/dL (ref 1.7–2.4)

## 2015-08-15 SURGERY — CARDIOVERSION (CATH LAB)
Anesthesia: General

## 2015-08-15 MED ORDER — DILTIAZEM HCL ER 60 MG PO CP12
60.0000 mg | ORAL_CAPSULE | Freq: Two times a day (BID) | ORAL | Status: DC
  Filled 2015-08-15: qty 1

## 2015-08-15 MED ORDER — DILTIAZEM HCL ER 60 MG PO CP12: 60.0000 mg | ORAL_CAPSULE | Freq: Two times a day (BID) | ORAL | Status: DC

## 2015-08-15 MED ORDER — DILTIAZEM HCL 30 MG PO TABS
30.0000 mg | ORAL_TABLET | Freq: Four times a day (QID) | ORAL | Status: DC
  Administered 2015-08-15: 30 mg via ORAL
  Filled 2015-08-15: qty 1

## 2015-08-15 MED ORDER — RIVAROXABAN 20 MG PO TABS: 20.0000 mg | ORAL_TABLET | Freq: Every day | ORAL | Status: DC

## 2015-08-15 MED ORDER — AMIODARONE HCL 400 MG PO TABS: 400.0000 mg | ORAL_TABLET | Freq: Two times a day (BID) | ORAL | Status: DC

## 2015-08-15 MED ORDER — LISINOPRIL 10 MG PO TABS: 30.0000 mg | ORAL_TABLET | Freq: Every day | ORAL | Status: DC

## 2015-08-15 MED ORDER — FUROSEMIDE 40 MG PO TABS: 40.0000 mg | ORAL_TABLET | Freq: Every day | ORAL | Status: DC

## 2015-08-15 MED ORDER — CARVEDILOL 12.5 MG PO TABS: 12.5000 mg | ORAL_TABLET | Freq: Two times a day (BID) | ORAL | Status: DC

## 2015-08-15 MED ORDER — POLYETHYLENE GLYCOL 3350 17 G PO PACK
17.0000 g | PACK | Freq: Every day | ORAL | Status: DC | PRN
  Administered 2015-08-15: 17 g via ORAL
  Filled 2015-08-15: qty 1

## 2015-08-15 MED ORDER — DIGOXIN 250 MCG PO TABS: 0.2500 mg | ORAL_TABLET | Freq: Every day | ORAL | Status: DC

## 2015-08-15 MED ORDER — EZETIMIBE 10 MG PO TABS: 10.0000 mg | ORAL_TABLET | Freq: Every day | ORAL | Status: DC

## 2015-08-15 NOTE — Discharge Summary (Addendum)
[facility] [place] NAME: Jeffrey Ortiz    MR#:  [id number]  DATE OF BIRTH:  11-01-1955  DATE OF ADMISSION:  08/11/2015 ADMITTING PHYSICIAN: Loletha Grayer, MD  DATE OF DISCHARGE:08/15/15  PRIMARY CARE PHYSICIAN: Deeann Cree, MD    ADMISSION DIAGNOSIS:  Shortness of breath [R06.02] SVT (supraventricular tachycardia) (HCC) [I47.1]  DISCHARGE DIAGNOSIS:  Rapid AFib/A flutter s/p Cardioversion -On oral anticoagulation -Acute CHF,systolic HTN DM-2 Suspected OSA (w/u in progress as outpt)  SECONDARY DIAGNOSIS:   Past Medical History  Diagnosis Date  . Essential hypertension   . Diabetes mellitus without complication ([place])   . GERD (gastroesophageal reflux disease)   . Overweight   . Hyperlipidemia     HOSPITAL COURSE:   Jeffrey Ortiz is a 60 y.o. male with a known history of Hypertension, hyperlipidemia and diabetes. 2 weeks ago he stated he woke up at 3:30 in the morning with some acid reflux that went up into his nose and mouth and he thinks he may have aspirated. He's felt palpitations for at least the last 7 days and shortness of breath.  In the ER he was found to be in rapid atrial flutter and numerous doses of IV medications were given without response. 2 doses of adenosine, 1 dose of Cardizem and 2 doses of metoprolol were given. Hospitalist services were contacted for further evaluation.  1. Atrial flutter with rapid ventricular response. Received 2 doses of adenosine, 1 dose of IV Cardizem and 2 doses of oral Cardizem.  -Patient was placed on amiodarone IV and oral per cardiology. -po metoprolol ,cardizem and digoxin -Patient is status post cardioversion on 08/13/2015. He flipped back into atrial flutter. -on po xarelto  2. Acute systolic congestive heart failure-new onset -lasix IV---po lasix -monitor I and O -echo showed EF 25-30% with diffuse hypokinesis  3. Accelerated hypertension -cont  current meds  4. Hyperlipidemia  -intolerant to statins -start zetia  5. Type 2 diabetes. Hold Glucophage since receive IV contrast. Put on sliding scale.  6. Suspected OSA getting sleep study done as outpt. Cardiology office working towards it  Overall stable HR in the 90's D/c home. [place] with dr Rockey Situ  CONSULTS OBTAINED:  Treatment Team:  Minna Merritts, MD  DRUG ALLERGIES:  No Known Allergies  DISCHARGE MEDICATIONS:   Current Discharge Medication List    START taking these medications   Details  amiodarone (PACERONE) 400 MG tablet Take 1 tablet (400 mg total) by mouth 2 (two) times daily. Qty: 30 tablet, Refills: 0    carvedilol (COREG) 12.5 MG tablet Take 1 tablet (12.5 mg total) by mouth 2 (two) times daily with a meal. Qty: 60 tablet, Refills: 1    digoxin (LANOXIN) 0.25 MG tablet Take 1 tablet (0.25 mg total) by mouth daily. Qty: 30 tablet, Refills: 1    diltiazem (CARDIZEM SR) 60 MG 12 hr capsule Take 1 capsule (60 mg total) by mouth every 12 (twelve) hours. Qty: 60 capsule, Refills: 1    ezetimibe (ZETIA) 10 MG tablet Take 1 tablet (10 mg total) by mouth daily. Qty: 30 tablet, Refills: 0    furosemide (LASIX) 40 MG tablet Take 1 tablet (40 mg total) by mouth daily. Qty: 30 tablet, Refills: 1    rivaroxaban (XARELTO) 20 MG TABS tablet Take 1 tablet (20 mg total) by mouth daily with supper. Qty: 30 tablet, Refills: 2      CONTINUE these medications which have CHANGED   Details  lisinopril (PRINIVIL,ZESTRIL) 10 MG tablet Take 3 tablets (30 mg total) by mouth daily. Qty: 30 tablet, Refills: 1      CONTINUE these medications which have NOT CHANGED   Details  aspirin EC 81 MG tablet Take 81 mg by mouth daily.    metFORMIN (GLUCOPHAGE) 500 MG tablet Take by mouth 2 (two) times daily with a meal. May increase to 1000mg  two times a day as needed      STOP taking these medications     atorvastatin (LIPITOR) 20 MG tablet         If you experience  worsening of your admission symptoms, develop shortness of breath, life threatening emergency, suicidal or homicidal thoughts you must seek medical attention immediately by calling 911 or calling your MD immediately  if symptoms less severe.  You Must read complete instructions/literature along with all the possible adverse reactions/side effects for all the Medicines you take and that have been prescribed to you. Take any new Medicines after you have completely understood and accept all the possible adverse reactions/side effects.   Please note  You were cared for by a hospitalist during your hospital stay. If you have any questions about your discharge medications or the care you received while you were in the hospital after you are discharged, you can call the unit and asked to speak with the hospitalist on call if the hospitalist that took care of you is not available. Once you are discharged, your primary care physician will handle any further medical issues. Please note that NO REFILLS for any discharge medications will be authorized once you are discharged, as it is imperative that you return to your primary care physician (or establish a relationship with a primary care physician if you do not have one) for your aftercare needs so that they can reassess your need for medications and monitor your lab values.  DATA REVIEW:   CBC   Recent Labs Lab 08/12/15 0351  WBC 10.3  HGB 14.1  HCT 41.2  PLT 199    Chemistries   Recent Labs Lab 08/15/15 1424  NA 135  K 3.6  CL 98*  CO2 28  GLUCOSE 165*  BUN 23*  CREATININE 1.25*  CALCIUM 8.7*  MG 2.1    Microbiology Results   Recent Results (from the past 240 hour(s))  MRSA PCR Screening     Status: None   Collection Time: 08/11/15  3:30 PM  Result Value Ref Range Status   MRSA by PCR NEGATIVE NEGATIVE Final    Comment:        The GeneXpert MRSA Assay (FDA approved for NASAL specimens only), is one component of a comprehensive  MRSA colonization surveillance program. It is not intended to diagnose MRSA infection nor to guide or monitor treatment for MRSA infections.     RADIOLOGY:  No results found.   Management plans discussed with the patient, family and they are in agreement.  CODE STATUS:     Code Status Orders        Start     Ordered   08/11/15 1158  Full code   Continuous     08/11/15 1158    Code Status History    Date Active Date Inactive Code Status Order ID Comments User Context   This patient has a current code status but no historical code status.      TOTAL TIME TAKING CARE OF THIS PATIENT: 40 minutes.    Mikhi Athey M.D on 08/15/2015 at 4:32  PM  Between 7am to 6pm - Pager - [phone] After 6pm go to [email or web address] - password [email or web address] [facility]  Office  [phone]  CC: Primary care physician; Deeann Cree, MD

## 2015-08-15 NOTE — Progress Notes (Signed)
Chris NP was notified that pt ambulated round the nurses station and HR stayed below 100. Amio gtt to be d/c. No further orders at this time.

## 2015-08-15 NOTE — Progress Notes (Signed)
Patient: Jeffrey Ortiz / Admit Date: 08/11/2015 / Date of Encounter: 08/15/2015, 9:19 AM   Subjective:  Normal sinus rhythm for several hours overnight Converting back to atrial flutter before midnight Atrial flutter with rate 90-110 this morning, feels better Has not ambulated  Review of Systems: Review of Systems  Constitutional: Positive for malaise/fatigue.  Respiratory: Negative.   Cardiovascular: Negative.   Gastrointestinal: Negative.   Musculoskeletal: Negative.   Neurological: Negative.   Psychiatric/Behavioral: Negative.   All other systems reviewed and are negative.   Objective: Telemetry: Atrial flutter with rate 107 bpm Physical Exam: Blood pressure 139/96, pulse 101, temperature 97.8 F (36.6 C), temperature source Oral, resp. rate 20, height 6' (1.829 m), weight 227 lb 3.2 oz (103.057 kg), SpO2 97 %. Body mass index is 30.81 kg/(m^2). General: Well developed, well nourished, in no acute distress. Head: Normocephalic, atraumatic, sclera non-icteric, no xanthomas, nares are without discharge. Neck: Negative for carotid bruits.  Lungs: Clear bilaterally to auscultation without wheezes,+ Scant rales at the bases, Breathing is unlabored. Heart: regular tachy, S1 S2 without murmurs, rubs, or gallops.  Abdomen: Soft, non-tender, non-distended with normoactive bowel sounds. No rebound/guarding. Extremities: No clubbing or cyanosis. No edema. Distal pedal pulses are 2+ and equal bilaterally. Neuro: Alert and oriented X 3. Moves all extremities spontaneously. Psych: Responds to questions appropriately with a normal affect.   Intake/Output Summary (Last 24 hours) at 08/15/15 0919 Last data filed at 08/15/15 0841  Gross per 24 hour  Intake    323 ml  Output   1950 ml  Net  -1627 ml    Inpatient Medications:  . amiodarone  400 mg Oral BID  . carvedilol  12.5 mg Oral BID WC  . digoxin  0.25 mg Oral Daily  . diltiazem  30 mg Oral Q6H  . ezetimibe  10 mg Oral  Daily  . furosemide  40 mg Intravenous BID  . insulin aspart  0-5 Units Subcutaneous QHS  . insulin aspart  0-9 Units Subcutaneous TID WC  . lisinopril  10 mg Oral Daily  . rivaroxaban  20 mg Oral Q supper   Infusions:  . amiodarone 60 mg/hr (08/15/15 0047)    Labs: No results for input(s): NA, K, CL, CO2, GLUCOSE, BUN, CREATININE, CALCIUM, MG, PHOS in the last 72 hours. No results for input(s): AST, ALT, ALKPHOS, BILITOT, PROT, ALBUMIN in the last 72 hours. No results for input(s): WBC, NEUTROABS, HGB, HCT, MCV, PLT in the last 72 hours. No results for input(s): CKTOTAL, CKMB, TROPONINI in the last 72 hours. Invalid input(s): POCBNP No results for input(s): HGBA1C in the last 72 hours.   Weights: Filed Weights   08/11/15 1510 08/13/15 1815  Weight: 251 lb 5.2 oz (114 kg) 227 lb 3.2 oz (103.057 kg)     Radiology/Studies:  Ct Angio Chest Pe W/cm &/or Wo Cm  08/11/2015  CLINICAL DATA:  Tachycardia and bilateral lower extremity swelling. EXAM: CT ANGIOGRAPHY CHEST WITH CONTRAST TECHNIQUE: Multidetector CT imaging of the chest was performed using the standard protocol during bolus administration of intravenous contrast. Multiplanar CT image reconstructions and MIPs were obtained to evaluate the vascular anatomy. CONTRAST:  75 cc Isovue 370 intravenous COMPARISON:  None. FINDINGS: Mediastinum/Lymph Nodes: Mild cardiomegaly. No pericardial effusion. Non-opacified aorta without acute finding. Motion degraded study, best obtainable in this patient with shortness of breath. There is no indication of pulmonary embolism. Small fatty Bochdalek's hernia on the left. Lungs/Pleura: Diffuse septal thickening with small borderline moderate bilateral layering  pleural effusion. Patchy ground-glass opacities in the bilateral lungs is consistent with alveolar edema in this clinical setting. Diffuse airway thickening is considered congestive. Upper abdomen: No acute findings. Musculoskeletal: No chest wall  mass or suspicious bone lesions identified. Review of the MIP images confirms the above findings. IMPRESSION: 1. CHF. 2. Motion degraded CTA without evidence of pulmonary embolism. Electronically Signed   By: Monte Fantasia M.D.   On: 08/11/2015 13:10   Dg Chest Portable 1 View  08/11/2015  CLINICAL DATA:  Tachycardia. EXAM: PORTABLE CHEST 1 VIEW COMPARISON:  None. FINDINGS: Cardiopericardial enlargement. Vascular pedicle widening and diffuse interstitial coarsening with Kerley lines. No asymmetric consolidation. Negative aortic contours for technique. No acute osseous finding. Small cervical ribs. IMPRESSION: CHF pattern. Electronically Signed   By: Monte Fantasia M.D.   On: 08/11/2015 10:42     Assessment and Plan  60 y.o. male  Atrial flutter Normal sinus rhythm for several hours overnight then back into atrial flutter Possibly exacerbated by sleep apnea  Would continue carvedilol, digoxin, amiodarone 400 mg 3 times a day for now,  start diltiazem 30 mg 3 times a day Then would wean IV amiodarone, monitor heart rate with exertion If he continues to run in the 150 range with exertion, will need to stay for more IV amiodarone infusion  Outpatient sleep study   Acute Systolic CHF Likely tachycardia mediated Continue medications as above We'll hold off on ACE inhibitor at this time given low blood pressure, need for rate control to restore normal sinus rhythm Lasix 40 mg daily at discharge As an outpatient, will add ACE inhibitor or entresto and aldactone, Continue digoxin  Total encounter time more than 35 minutes Greater than 50% was spent in counseling and coordination of care with the patient Long discussion with family concerning the plan above  Signed, Esmond Plants, MD, Ph.D. [facility] 08/15/2015, 9:19 AM

## 2015-08-15 NOTE — Progress Notes (Signed)
Pt had a 2.07 second pause, returned to a fib in the 90s. MD Dr. Marcille Blanco notified. RN will continue to monitor. Rachael Fee, RN

## 2015-08-15 NOTE — Discharge Instructions (Signed)
Heart Failure Clinic appointment on August 25, 2015 at 9:00am with Darylene Price, [place]. Please call [phone] to reschedule.   MAke sure to get your SLEEP study as outpt

## 2015-08-15 NOTE — Care Management (Signed)
Cardioverted 06/28 without success. Amioderone gtt. Remains. HR 69-140.

## 2015-08-15 NOTE — Care Management (Signed)
Requested patient have his wife bring [employer] card and take to admitted to get his records updated.

## 2015-08-15 NOTE — Progress Notes (Signed)
Patient ID: Jeffrey Ortiz, male   DOB: 11/14/1955, 60 y.o.   MRN: [id number] [place] [facility] [place] NAME: Jeffrey Ortiz    MR#:  [id number]  DATE OF BIRTH:  1955-11-20  SUBJECTIVE:   Came in with increasing shortness of breath hypoxia was found to be in rapid A. fib flutter in the ICU getting IV Cardizem drip. Denies any chest pain. Continues with Intermittent elevated heart rate. Patient slipped back into atrial flutter. Denies any chest pain. No shortness of breath. REVIEW OF SYSTEMS:   Review of Systems  Constitutional: Negative for fever, chills and weight loss.  HENT: Negative for ear discharge, ear pain and nosebleeds.   Eyes: Negative for blurred vision, pain and discharge.  Respiratory: Positive for shortness of breath. Negative for sputum production, wheezing and stridor.   Cardiovascular: Positive for palpitations. Negative for chest pain, orthopnea and PND.  Gastrointestinal: Negative for nausea, vomiting, abdominal pain and diarrhea.  Genitourinary: Negative for urgency and frequency.  Musculoskeletal: Negative for back pain and joint pain.  Neurological: Positive for weakness. Negative for sensory change, speech change and focal weakness.  Psychiatric/Behavioral: Negative for depression and hallucinations. The patient is not nervous/anxious.   All other systems reviewed and are negative.  Tolerating Diet:yes Tolerating PT: not needed  DRUG ALLERGIES:  No Known Allergies  VITALS:  Blood pressure 115/64, pulse 89, temperature 97.8 F (36.6 C), temperature source Oral, resp. rate 19, height 6' (1.829 m), weight 103.057 kg (227 lb 3.2 oz), SpO2 95 %.  PHYSICAL EXAMINATION:   Physical Exam  GENERAL:  60 y.o.-year-old patient lying in the bed with no acute distress. Obese EYES: Pupils equal, round, reactive to light and accommodation. No scleral icterus. Extraocular muscles intact.  HEENT: Head atraumatic,  normocephalic. Oropharynx and nasopharynx clear.  NECK:  Supple, no jugular venous distention. No thyroid enlargement, no tenderness.  LUNGS: Normal breath sounds bilaterally, no wheezing, rales, rhonchi. No use of accessory muscles of respiration.  CARDIOVASCULAR: S1, S2 normal. No murmurs, rubs, or gallops. Irregular heart rhythm ABDOMEN: Soft, nontender, nondistended. Bowel sounds present. No organomegaly or mass.  EXTREMITIES: No cyanosis, clubbing or edema b/l.    NEUROLOGIC: Cranial nerves II through XII are intact. No focal Motor or sensory deficits b/l.   PSYCHIATRIC:  patient is alert and oriented x 3.  SKIN: No obvious rash, lesion, or ulcer.   LABORATORY PANEL:  CBC  Recent Labs Lab 08/12/15 0351  WBC 10.3  HGB 14.1  HCT 41.2  PLT 199    Chemistries   Recent Labs Lab 08/11/15 1008 08/12/15 0351  NA 137 139  K 3.8 3.8  CL 105 109  CO2 22 22  GLUCOSE 181* 124*  BUN 17 20  CREATININE 0.99 1.01  CALCIUM 8.6* 8.2*  MG 1.9  --    Cardiac Enzymes  Recent Labs Lab 08/11/15 1920  TROPONINI <0.03   RADIOLOGY:  No results found. ASSESSMENT AND PLAN:  Allen Haasch is a 60 y.o. male with a known history of Hypertension, hyperlipidemia and diabetes. 2 weeks ago he stated he woke up at 3:30 in the morning with some acid reflux that went up into his nose and mouth and he thinks he may have aspirated.  He's felt palpitations for at least the last 7 days and shortness of breath.  In the ER he was found to be in rapid atrial flutter and numerous doses of IV medications were given without response. 2 doses of  adenosine, 1 dose of Cardizem and 2 doses of metoprolol were given. Hospitalist services were contacted for further evaluation.  1. Atrial flutter with rapid ventricular response. Received 2 doses of adenosine, 1 dose of IV Cardizem and 2 doses of oral Cardizem.  -Patient now placed on amiodarone IV and oral per cardiology. -po metoprolol and digoxin -Patient is  status post cardioversion on 08/13/2015. He flipped back into atrial flutter. He will he may need repeat cardioversion. -on po xarelto  2. Acute systolic congestive heart failure-new onset -lasix IV -monitor I and O -echo showed EF 25-30% with diffuse hypokinesis  3. Accelerated hypertension -cont current meds  4. Hyperlipidemia  -intolerant to statins -start zetia  5. Type 2 diabetes. Hold Glucophage since receive IV contrast. Put on sliding scale.  6. Suspected OSA getting sleep study done as outpt. Cardiology office working towards it  Case discussed with Care Management/Social Worker. Management plans discussed with the patient, family and they are in agreement.  CODE STATUS: full DVT Prophylaxis: xarelto TOTAL  TIME TAKING CARE OF THIS PATIENT: 30 minutes.  >50% time spent on counselling and coordination of care  D/C DEPENDING ON CLINICAL CONDITION.  Note: This dictation was prepared with Dragon dictation along with smaller phrase technology. Any transcriptional errors that result from this process are unintentional.  Korena Nass M.D on 08/13/2015 at 1:49 PM  Between 7am to 6pm - Pager - [phone]  After 6pm go to [email or web address] - password [email or web address] [facility]  Office  [phone]  CC: Primary care physician; Deeann Cree, MD

## 2015-08-15 NOTE — Progress Notes (Signed)
Amio gtt was d/c. AMbulated around the nurses station and HR 90s. Room air. Pt reports constipation and received miralax. Up to chair and tolerated it well. No pain. A & O. Pt has no further concerns at this time.

## 2015-08-15 NOTE — Progress Notes (Signed)
IV and tele removed. Discharge instructions given to pt. Xalrelto card given to pt. Med Management app given to pt. Pt has no further concerns at this time.

## 2015-08-20 ENCOUNTER — Telehealth: Payer: Self-pay

## 2015-08-20 NOTE — Telephone Encounter (Signed)
Patient contacted regarding discharge from [facility] on 08/20/15.  Patient understands to follow up with Christell Faith, PA on 09/23/15 at 1:30 at [facility]. Patient understands discharge instructions? yes Patient understands medications and regiment? yes Patient understands to bring all medications to this visit? yes  Pt's sister states that she was present in pt's hospital room when Dr.Gollan stated that he wanted to see pt w/in a week. Spoke w/ Dr. Rockey Situ.  Added pt on to his schedule this Friday, 08/22/15 @ 3:40.

## 2015-08-20 NOTE — Telephone Encounter (Signed)
-----   Message from Arie Sabina sent at 08/20/2015  4:50 PM EDT ----- Regarding: tcm/ph Patient havig medication Issues post hospital. 09/23/15 @ 1:30 R. Dunn

## 2015-08-21 ENCOUNTER — Ambulatory Visit (INDEPENDENT_AMBULATORY_CARE_PROVIDER_SITE_OTHER): Payer: Self-pay | Admitting: Internal Medicine

## 2015-08-21 ENCOUNTER — Encounter: Payer: Self-pay | Admitting: Internal Medicine

## 2015-08-21 VITALS — BP 138/80 | HR 59 | Ht 72.0 in | Wt 217.0 lb

## 2015-08-21 DIAGNOSIS — G4719 Other hypersomnia: Secondary | ICD-10-CM

## 2015-08-21 DIAGNOSIS — I10 Essential (primary) hypertension: Secondary | ICD-10-CM

## 2015-08-21 NOTE — Patient Instructions (Signed)
Sleep Study needed ASAP Sleep Apnea  Sleep apnea is a sleep disorder characterized by abnormal pauses in breathing while you sleep. When your breathing pauses, the level of oxygen in your blood decreases. This causes you to move out of deep sleep and into light sleep. As a result, your quality of sleep is poor, and the system that carries your blood throughout your body (cardiovascular system) experiences stress. If sleep apnea remains untreated, the following conditions can develop:  High blood pressure (hypertension).  Coronary artery disease.  Inability to achieve or maintain an erection (impotence).  Impairment of your thought process (cognitive dysfunction). There are three types of sleep apnea: 1. Obstructive sleep apnea--Pauses in breathing during sleep because of a blocked airway. 2. Central sleep apnea--Pauses in breathing during sleep because the area of the brain that controls your breathing does not send the correct signals to the muscles that control breathing. 3. Mixed sleep apnea--A combination of both obstructive and central sleep apnea. RISK FACTORS The following risk factors can increase your risk of developing sleep apnea:  Being overweight.  Smoking.  Having narrow passages in your nose and throat.  Being of older age.  Being male.  Alcohol use.  Sedative and tranquilizer use.  Ethnicity. Among individuals younger than 35 years, African Americans are at increased risk of sleep apnea. SYMPTOMS   Difficulty staying asleep.  Daytime sleepiness and fatigue.  Loss of energy.  Irritability.  Loud, heavy snoring.  Morning headaches.  Trouble concentrating.  Forgetfulness.  Decreased interest in sex.  Unexplained sleepiness. DIAGNOSIS  In order to diagnose sleep apnea, your caregiver will perform a physical examination. A sleep study done in the comfort of your own home may be appropriate if you are otherwise healthy. Your caregiver may also  recommend that you spend the night in a sleep lab. In the sleep lab, several monitors record information about your heart, lungs, and brain while you sleep. Your leg and arm movements and blood oxygen level are also recorded. TREATMENT The following actions may help to resolve mild sleep apnea:  Sleeping on your side.   Using a decongestant if you have nasal congestion.   Avoiding the use of depressants, including alcohol, sedatives, and narcotics.   Losing weight and modifying your diet if you are overweight. There also are devices and treatments to help open your airway:  Oral appliances. These are custom-made mouthpieces that shift your lower jaw forward and slightly open your bite. This opens your airway.  Devices that create positive airway pressure. This positive pressure "splints" your airway open to help you breathe better during sleep. The following devices create positive airway pressure:  Continuous positive airway pressure (CPAP) device. The CPAP device creates a continuous level of air pressure with an air pump. The air is delivered to your airway through a mask while you sleep. This continuous pressure keeps your airway open.  Nasal expiratory positive airway pressure (EPAP) device. The EPAP device creates positive air pressure as you exhale. The device consists of single-use valves, which are inserted into each nostril and held in place by adhesive. The valves create very little resistance when you inhale but create much more resistance when you exhale. That increased resistance creates the positive airway pressure. This positive pressure while you exhale keeps your airway open, making it easier to breath when you inhale again.  Bilevel positive airway pressure (BPAP) device. The BPAP device is used mainly in patients with central sleep apnea. This device is similar  to the CPAP device because it also uses an air pump to deliver continuous air pressure through a mask. However,  with the BPAP machine, the pressure is set at two different levels. The pressure when you exhale is lower than the pressure when you inhale.  Surgery. Typically, surgery is only done if you cannot comply with less invasive treatments or if the less invasive treatments do not improve your condition. Surgery involves removing excess tissue in your airway to create a wider passage way.   This information is not intended to replace advice given to you by your health care provider. Make sure you discuss any questions you have with your health care provider.   Document Released: 01/22/2002 Document Revised: 02/22/2014 Document Reviewed: 06/10/2011 Elsevier Interactive Patient Education [employer].

## 2015-08-21 NOTE — Progress Notes (Signed)
[facility] Consultation      Date: 08/21/2015,   MRN# [id number] Kerney Nihart [birth year] Code Status:  Code Status History    Date Active Date Inactive Code Status Order ID Comments User Context   08/11/2015 11:58 AM 08/15/2015  9:41 PM Full Code [id number]  Loletha Grayer, MD ED     [facility] day:@LENGTHOFSTAYDAYS @ Referring MD: @ATDPROV @     PCP:      AdmissionWeight: 217 lb (98.431 kg)                 CurrentWeight: 217 lb (98.431 kg) [place] Filipiak is a 60 y.o. old male seen in consultation for sleep problems at the request of Dr. Ivar Drape. Candis Musa     CHIEF COMPLAINT:   Weakness and problems sleeping recenet hospital admissin f  HISTORY OF PRESENT ILLNESS  60 y.o. male with a known history of Hypertension, hyperlipidemia and diabetes.  2 weeks ago he stated he woke up at 3:30 in the morning with some acid reflux that went up into his nose and mouth and he thinks he may have aspirated.  -Every night when he is laying down he wakes up gasping for air.  -He's been coughing up some phlegm.  -He's felt palpitations for at least the last 7 days and shortness of breath.  -he went into the walk-in clinic and found his blood pressure very high and his heart rate very high and he was sent into the ER for further evaluation. In the ER he was found to be in rapid atrial flutter and numerous doses of IV medications were given without response. 2 doses of adenosine, 1 dose of Cardizem and 2 doses of metoprolol were given.  Patient was discharged home wit multiple medications after cardioversion and medications adjusted Patient still feels weak, but better since admission  No signs of infection at this time  Has Epworth sleep score of 19 Excessive daytime sleepiness and loud snoring with with witnessed apneas by wife     PAST MEDICAL HISTORY   Past Medical History  Diagnosis Date  . Essential hypertension   . Diabetes mellitus without complication ([place])   .  GERD (gastroesophageal reflux disease)   . Overweight   . Hyperlipidemia      SURGICAL HISTORY   Past Surgical History  Procedure Laterality Date  . Tee without cardioversion N/A 08/13/2015    Procedure: TRANSESOPHAGEAL ECHOCARDIOGRAM (TEE) and cardioversion;  Surgeon: Minna Merritts, MD;  Location: [facility] ORS;  Service: Cardiovascular;  Laterality: N/A;     FAMILY HISTORY   Family History  Problem Relation Age of Onset  . Heart attack Father     died in his 52's w/ cancer but dx CAD in his 51's.  . Cancer Father   . Heart attack Brother     died in early 32's.  . Other Mother     alive @ 61     SOCIAL HISTORY   Social History  Substance Use Topics  . Smoking status: Never Smoker   . Smokeless tobacco: None  . Alcohol Use: 0.0 oz/week    0 Standard drinks or equivalent per week     Comment: occasional drink - rare (< 1/wk).     MEDICATIONS    Home Medication:  Current Outpatient Rx  Name  Route  Sig  Dispense  Refill  . amiodarone (PACERONE) 400 MG tablet   Oral   Take 1 tablet (400 mg total) by mouth 2 (two) times daily.  30 tablet   0   . aspirin EC 81 MG tablet   Oral   Take 81 mg by mouth daily.         . carvedilol (COREG) 12.5 MG tablet   Oral   Take 1 tablet (12.5 mg total) by mouth 2 (two) times daily with a meal.   60 tablet   1   . digoxin (LANOXIN) 0.25 MG tablet   Oral   Take 1 tablet (0.25 mg total) by mouth daily.   30 tablet   1   . diltiazem (CARDIZEM SR) 60 MG 12 hr capsule   Oral   Take 1 capsule (60 mg total) by mouth every 12 (twelve) hours.   60 capsule   1   . ezetimibe (ZETIA) 10 MG tablet   Oral   Take 1 tablet (10 mg total) by mouth daily.   30 tablet   0   . furosemide (LASIX) 40 MG tablet   Oral   Take 1 tablet (40 mg total) by mouth daily.   30 tablet   1   . lisinopril (PRINIVIL,ZESTRIL) 10 MG tablet   Oral   Take 3 tablets (30 mg total) by mouth daily.   30 tablet   1   . metFORMIN  (GLUCOPHAGE) 500 MG tablet   Oral   Take by mouth 2 (two) times daily with a meal. May increase to 1000mg  two times a day as needed         . rivaroxaban (XARELTO) 20 MG TABS tablet   Oral   Take 1 tablet (20 mg total) by mouth daily with supper.   30 tablet   2     Current Medication:  Current outpatient prescriptions:  .  amiodarone (PACERONE) 400 MG tablet, Take 1 tablet (400 mg total) by mouth 2 (two) times daily., Disp: 30 tablet, Rfl: 0 .  aspirin EC 81 MG tablet, Take 81 mg by mouth daily., Disp: , Rfl:  .  carvedilol (COREG) 12.5 MG tablet, Take 1 tablet (12.5 mg total) by mouth 2 (two) times daily with a meal., Disp: 60 tablet, Rfl: 1 .  digoxin (LANOXIN) 0.25 MG tablet, Take 1 tablet (0.25 mg total) by mouth daily., Disp: 30 tablet, Rfl: 1 .  diltiazem (CARDIZEM SR) 60 MG 12 hr capsule, Take 1 capsule (60 mg total) by mouth every 12 (twelve) hours., Disp: 60 capsule, Rfl: 1 .  ezetimibe (ZETIA) 10 MG tablet, Take 1 tablet (10 mg total) by mouth daily., Disp: 30 tablet, Rfl: 0 .  furosemide (LASIX) 40 MG tablet, Take 1 tablet (40 mg total) by mouth daily., Disp: 30 tablet, Rfl: 1 .  lisinopril (PRINIVIL,ZESTRIL) 10 MG tablet, Take 3 tablets (30 mg total) by mouth daily., Disp: 30 tablet, Rfl: 1 .  metFORMIN (GLUCOPHAGE) 500 MG tablet, Take by mouth 2 (two) times daily with a meal. May increase to 1000mg  two times a day as needed, Disp: , Rfl:  .  rivaroxaban (XARELTO) 20 MG TABS tablet, Take 1 tablet (20 mg total) by mouth daily with supper., Disp: 30 tablet, Rfl: 2    ALLERGIES   Review of patient's allergies indicates no known allergies.     REVIEW OF SYSTEMS   Review of Systems  Constitutional: Positive for malaise/fatigue. Negative for fever, chills, weight loss and diaphoresis.  HENT: Negative for congestion and hearing loss.   Eyes: Negative for blurred vision and double vision.  Respiratory: Positive for shortness of breath. Negative for cough,  hemoptysis,  sputum production and wheezing.   Cardiovascular: Negative for chest pain, palpitations, orthopnea and leg swelling.  Gastrointestinal: Negative for heartburn, nausea, vomiting and abdominal pain.  Genitourinary: Negative for dysuria and urgency.  Musculoskeletal: Negative for myalgias, back pain and neck pain.  Skin: Negative for rash.  Neurological: Positive for dizziness and weakness. Negative for headaches.  Endo/Heme/Allergies: Does not bruise/bleed easily.  Psychiatric/Behavioral: The patient is nervous/anxious.   All other systems reviewed and are negative.    VS: BP 138/80 mmHg  Pulse 59  Ht 6' (1.829 m)  Wt 217 lb (98.431 kg)  BMI 29.42 kg/m2  SpO2 95%     PHYSICAL EXAM  Physical Exam  Constitutional: He is oriented to person, place, and time. He appears well-developed and well-nourished. No distress.  HENT:  Head: Normocephalic and atraumatic.  Mouth/Throat: No oropharyngeal exudate.  Eyes: EOM are normal. Pupils are equal, round, and reactive to light. No scleral icterus.  Neck: Normal range of motion. Neck supple.  Cardiovascular: Normal rate, regular rhythm and normal heart sounds.   No murmur heard. Pulmonary/Chest: No stridor. No respiratory distress. He has no wheezes. He has no rales.  Abdominal: Soft. Bowel sounds are normal.  Musculoskeletal: Normal range of motion. He exhibits no edema.  Neurological: He is alert and oriented to person, place, and time. No cranial nerve deficit.  Skin: Skin is warm. He is not diaphoretic.  Psychiatric: He has a normal mood and affect.          CULTURE RESULTS   Recent Results (from the past 240 hour(s))  MRSA PCR Screening     Status: None   Collection Time: 08/11/15  3:30 PM  Result Value Ref Range Status   MRSA by PCR NEGATIVE NEGATIVE Final    Comment:        The GeneXpert MRSA Assay (FDA approved for NASAL specimens only), is one component of a comprehensive MRSA colonization surveillance program. It is  not intended to diagnose MRSA infection nor to guide or monitor treatment for MRSA infections.           IMAGING    Ct Angio Chest Pe W/cm &/or Wo Cm  08/11/2015  CLINICAL DATA:  Tachycardia and bilateral lower extremity swelling. EXAM: CT ANGIOGRAPHY CHEST WITH CONTRAST TECHNIQUE: Multidetector CT imaging of the chest was performed using the standard protocol during bolus administration of intravenous contrast. Multiplanar CT image reconstructions and MIPs were obtained to evaluate the vascular anatomy. CONTRAST:  75 cc Isovue 370 intravenous COMPARISON:  None. FINDINGS: Mediastinum/Lymph Nodes: Mild cardiomegaly. No pericardial effusion. Non-opacified aorta without acute finding. Motion degraded study, best obtainable in this patient with shortness of breath. There is no indication of pulmonary embolism. Small fatty Bochdalek's hernia on the left. Lungs/Pleura: Diffuse septal thickening with small borderline moderate bilateral layering pleural effusion. Patchy ground-glass opacities in the bilateral lungs is consistent with alveolar edema in this clinical setting. Diffuse airway thickening is considered congestive. Upper abdomen: No acute findings. Musculoskeletal: No chest wall mass or suspicious bone lesions identified. Review of the MIP images confirms the above findings. IMPRESSION: 1. CHF. 2. Motion degraded CTA without evidence of pulmonary embolism. Electronically Signed   By: Monte Fantasia M.D.   On: 08/11/2015 13:10   Dg Chest Portable 1 View  08/11/2015  CLINICAL DATA:  Tachycardia. EXAM: PORTABLE CHEST 1 VIEW COMPARISON:  None. FINDINGS: Cardiopericardial enlargement. Vascular pedicle widening and diffuse interstitial coarsening with Kerley lines. No asymmetric consolidation. Negative aortic contours for technique.  No acute osseous finding. Small cervical ribs. IMPRESSION: CHF pattern. Electronically Signed   By: Monte Fantasia M.D.   On: 08/11/2015 10:42   ECHO 07/2015 EF  25-30% CT chest images reviewed 08/21/2015  ECHO ef 25-30%  ASSESSMENT/PLAN   60 yo white male with newly dx of acute CHF with atrial flutter with signs and symptoms highly suggestive of sleep apnea  Patient will need Sleep study ASAP   I have personally obtained a history, examined the patient, evaluated laboratory and independently reviewed imaging results, formulated the assessment and plan and placed orders.  The Patient requires high complexity decision making for assessment and support, frequent evaluation and titration of therapies, application of advanced monitoring technologies and extensive interpretation of multiple databases.   Patient/Family are satisfied with Plan of action and management. All questions answered  Corrin Parker, M.D.  [facility]  Medical Director [place] Director [facility] Cardio-Pulmonary Department

## 2015-08-22 ENCOUNTER — Encounter: Payer: Self-pay | Admitting: Cardiovascular Disease

## 2015-08-22 ENCOUNTER — Ambulatory Visit (INDEPENDENT_AMBULATORY_CARE_PROVIDER_SITE_OTHER): Payer: Self-pay | Admitting: Cardiovascular Disease

## 2015-08-22 VITALS — BP 145/90 | HR 72 | Ht 72.0 in | Wt 216.2 lb

## 2015-08-22 DIAGNOSIS — I480 Paroxysmal atrial fibrillation: Secondary | ICD-10-CM

## 2015-08-22 DIAGNOSIS — I4892 Unspecified atrial flutter: Secondary | ICD-10-CM

## 2015-08-22 DIAGNOSIS — R0602 Shortness of breath: Secondary | ICD-10-CM

## 2015-08-22 DIAGNOSIS — G4730 Sleep apnea, unspecified: Secondary | ICD-10-CM | POA: Insufficient documentation

## 2015-08-22 DIAGNOSIS — I5021 Acute systolic (congestive) heart failure: Secondary | ICD-10-CM

## 2015-08-22 MED ORDER — POTASSIUM CHLORIDE CRYS ER 20 MEQ PO TBCR: 20.0000 meq | EXTENDED_RELEASE_TABLET | Freq: Every day | ORAL | Status: DC | PRN

## 2015-08-22 NOTE — Progress Notes (Signed)
Patient ID: Jeffrey Ortiz, male   DOB: May 05, 1955, 60 y.o.   MRN: [id number] Cardiology Office Note  Date:  08/22/2015   ID:  Jeffrey Ortiz, DOB 1955-09-04, MRN [id number]  PCP:  Coral Spikes, DO   Chief Complaint  Patient presents with  . other    F/u hospital c/o nausea. Meds reviewed verbally with pt.    HPI:  Jeffrey Ortiz is a 60 year old gentleman with a history of GERD, diabetes, hypertension, hyperlipidemia, presenting to the hospital 08/11/2015 with worsening shortness of breath, orthopnea, PND He reports symptoms initially started after severe GERD, vomiting, aspiration into his sinuses He was noted to be in atrial flutter with RVR We had difficulty controlling his heart rate in the hospital, he underwent TEE with cardioversion Cardioversion was initially successful though converted to atrial fibrillation Later back on the floor he went back into atrial flutter He presents today for follow-up of his atrial flutter and atrial fibrillation, persistent  In follow-up today, he reports that he feels much better Feels that he has got the fluid out, heart rate is staying low He continues to take amiodarone 400 mg twice a day, Coreg 12.5 mg twice a day, diltiazem SR 60  mgrams twice a day, lisinopril 10 mg daily He has been taking anticoagulation with Xarelto 20 mg daily  He would like to go back to work. He is interested in trying to restore normal sinus rhythm as he does not feel back to his baseline. He is scheduled for sleep study in early August 2017 Currently taking Lasix 40 mg daily. Denies any significant leg edema, PND or orthopnea  EKG on today's visit shows atrial flutter with ventricular rate 72 bpm, nonspecific ST and T wave abnormality   PMH:   has a past medical history of Essential hypertension; Diabetes mellitus without complication ([place]); GERD (gastroesophageal reflux disease); Overweight; and Hyperlipidemia.  PSH:    Past Surgical History  Procedure Laterality  Date  . Tee without cardioversion N/A 08/13/2015    Procedure: TRANSESOPHAGEAL ECHOCARDIOGRAM (TEE) and cardioversion;  Surgeon: Minna Merritts, MD;  Location: [facility] ORS;  Service: Cardiovascular;  Laterality: N/A;    Current Outpatient Prescriptions  Medication Sig Dispense Refill  . amiodarone (PACERONE) 400 MG tablet Take 1 tablet (400 mg total) by mouth 2 (two) times daily. 30 tablet 0  . aspirin EC 81 MG tablet Take 81 mg by mouth daily.    . carvedilol (COREG) 12.5 MG tablet Take 1 tablet (12.5 mg total) by mouth 2 (two) times daily with a meal. 60 tablet 1  . digoxin (LANOXIN) 0.25 MG tablet Take 1 tablet (0.25 mg total) by mouth daily. 30 tablet 1  . diltiazem (CARDIZEM SR) 60 MG 12 hr capsule Take 1 capsule (60 mg total) by mouth every 12 (twelve) hours. 60 capsule 1  . furosemide (LASIX) 40 MG tablet Take 1 tablet (40 mg total) by mouth daily. 30 tablet 1  . lisinopril (PRINIVIL,ZESTRIL) 10 MG tablet Take 3 tablets (30 mg total) by mouth daily. 30 tablet 1  . metFORMIN (GLUCOPHAGE) 500 MG tablet Take by mouth 2 (two) times daily with a meal. May increase to 1000mg  two times a day as needed    . rivaroxaban (XARELTO) 20 MG TABS tablet Take 1 tablet (20 mg total) by mouth daily with supper. 30 tablet 2  . potassium chloride SA (K-DUR,KLOR-CON) 20 MEQ tablet Take 1 tablet (20 mEq total) by mouth daily as needed. 30 tablet 6   No current  facility-administered medications for this visit.     Allergies:   Review of patient's allergies indicates no known allergies.   Social History:  The patient  reports that he has never smoked. He does not have any smokeless tobacco history on file. He reports that he drinks alcohol. He reports that he does not use illicit drugs.   Family History:   family history includes Cancer in his father; Heart attack in his brother and father; Other in his mother.    Review of Systems: Review of Systems  Constitutional: Positive for malaise/fatigue.   Respiratory: Positive for shortness of breath.   Cardiovascular: Negative.   Gastrointestinal: Negative.   Musculoskeletal: Negative.   Neurological: Negative.   Psychiatric/Behavioral: Negative.   All other systems reviewed and are negative.    PHYSICAL EXAM: VS:  BP 145/90 mmHg  Pulse 72  Ht 6' (1.829 m)  Wt 216 lb 4 oz (98.09 kg)  BMI 29.32 kg/m2 , BMI Body mass index is 29.32 kg/(m^2). GEN: Well nourished, well developed, in no acute distress HEENT: normal Neck: no JVD, carotid bruits, or masses Cardiac: RRR; no murmurs, rubs, or gallops,no edema  Respiratory:  clear to auscultation bilaterally, normal work of breathing GI: soft, nontender, nondistended, + BS MS: no deformity or atrophy Skin: warm and dry, no rash Neuro:  Strength and sensation are intact Psych: euthymic mood, full affect    Recent Labs: 08/11/2015: B Natriuretic Peptide 297.0*; TSH 0.979 08/12/2015: Hemoglobin 14.1; Platelets 199 08/15/2015: BUN 23*; Creatinine, Ser 1.25*; Magnesium 2.1; Potassium 3.6; Sodium 135    Lipid Panel Lab Results  Component Value Date   CHOL 152 08/12/2015   HDL 27* 08/12/2015   LDLCALC 105* 08/12/2015   TRIG 100 08/12/2015      Wt Readings from Last 3 Encounters:  08/22/15 216 lb 4 oz (98.09 kg)  08/21/15 217 lb (98.431 kg)  08/13/15 227 lb 3.2 oz (103.057 kg)       ASSESSMENT AND PLAN:  Atrial flutter with rapid ventricular response (HCC) - Plan: EKG 12-Lead Ventricular rate dramatically improved on current medications We will decrease the amiodarone down to 200 mg twice a day Recommended he closely monitor his heart rate at home and if heart rate increases more than 110 bpm, suggested he go back on high-dose amiodarone 400 mg twice a day until seen by Dr. Caryl Comes. I suspect he will need flutter ablation after his sleep study in August given the difficulty controlling his ventricular rate while in flutter.  Paroxysmal atrial fibrillation (HCC) - Plan: EKG  12-Lead He did have atrial fibrillation following cardioversion This may be less of an issue on amiodarone, He did convert from atrial fibrillation to atrial flutter in the hospital Flutter seems to be his predominant rhythm causing most of his symptoms with difficult to control rate Atrial fibrillation rate in the hospital 70 to 80  bpm  Acute systolic CHF (congestive heart failure) ([place]) There was a climb in his creatinine close to the time of discharge. Fluid is now gone, he feels better Recommended he decrease Lasix down to every other day with potassium 20 mEq  Shortness of breath Shortness of breath has resolved, significant diuresis since his hospital discharge Decreased Lasix as above  Sleep apnea Discussed pending sleep study with him. Scheduled for early August   Total encounter time more than 25 minutes  Greater than 50% was spent in counseling and coordination of care with the patient   Disposition:   F/U  6  months   Orders Placed This Encounter  Procedures  . EKG 12-Lead     Signed, Esmond Plants, M.D., Ph.D. 08/22/2015  [place], [place]

## 2015-08-22 NOTE — Patient Instructions (Addendum)
Medication Instructions:   Ok to take diltiazem ER 120 mg daily if you qualify Stay on xarelto   When your blood pressure cuff comes in there mail, Cut the amiodarone down to 200 mg twice a day (1/2 pill twice a day) If your heart rate runs back up to >110, Go back on the high dose amiodarone 400 mg twice  Day  Please decrease the lasix to every other day Take with potassium  Labwork:  No new labs  Testing/Procedures:  No new testing  Follow-Up: It was a pleasure seeing you in the office today. Please call us if you have new issues that need to be addressed before your next appt.  [phone]  Your physician wants you to follow-up in: 2 month with Caryl Comes   If you need a refill on your cardiac medications before your next appointment, please call your pharmacy.

## 2015-08-25 ENCOUNTER — Ambulatory Visit: Payer: Self-pay | Attending: Family | Admitting: Family

## 2015-08-25 ENCOUNTER — Encounter: Payer: Self-pay | Admitting: Family

## 2015-08-25 VITALS — BP 129/69 | HR 62 | Resp 20 | Ht 72.0 in | Wt 220.0 lb

## 2015-08-25 DIAGNOSIS — I480 Paroxysmal atrial fibrillation: Secondary | ICD-10-CM

## 2015-08-25 DIAGNOSIS — E785 Hyperlipidemia, unspecified: Secondary | ICD-10-CM | POA: Insufficient documentation

## 2015-08-25 DIAGNOSIS — K219 Gastro-esophageal reflux disease without esophagitis: Secondary | ICD-10-CM | POA: Insufficient documentation

## 2015-08-25 DIAGNOSIS — Z7901 Long term (current) use of anticoagulants: Secondary | ICD-10-CM | POA: Insufficient documentation

## 2015-08-25 DIAGNOSIS — Z7984 Long term (current) use of oral hypoglycemic drugs: Secondary | ICD-10-CM | POA: Insufficient documentation

## 2015-08-25 DIAGNOSIS — I10 Essential (primary) hypertension: Secondary | ICD-10-CM

## 2015-08-25 DIAGNOSIS — R5383 Other fatigue: Secondary | ICD-10-CM | POA: Insufficient documentation

## 2015-08-25 DIAGNOSIS — R002 Palpitations: Secondary | ICD-10-CM | POA: Insufficient documentation

## 2015-08-25 DIAGNOSIS — I110 Hypertensive heart disease with heart failure: Secondary | ICD-10-CM | POA: Insufficient documentation

## 2015-08-25 DIAGNOSIS — I4891 Unspecified atrial fibrillation: Secondary | ICD-10-CM | POA: Insufficient documentation

## 2015-08-25 DIAGNOSIS — E119 Type 2 diabetes mellitus without complications: Secondary | ICD-10-CM | POA: Insufficient documentation

## 2015-08-25 DIAGNOSIS — G4730 Sleep apnea, unspecified: Secondary | ICD-10-CM | POA: Insufficient documentation

## 2015-08-25 DIAGNOSIS — Z7982 Long term (current) use of aspirin: Secondary | ICD-10-CM | POA: Insufficient documentation

## 2015-08-25 DIAGNOSIS — I5022 Chronic systolic (congestive) heart failure: Secondary | ICD-10-CM | POA: Insufficient documentation

## 2015-08-25 DIAGNOSIS — Z79899 Other long term (current) drug therapy: Secondary | ICD-10-CM | POA: Insufficient documentation

## 2015-08-25 DIAGNOSIS — Z8249 Family history of ischemic heart disease and other diseases of the circulatory system: Secondary | ICD-10-CM | POA: Insufficient documentation

## 2015-08-25 DIAGNOSIS — R001 Bradycardia, unspecified: Secondary | ICD-10-CM | POA: Insufficient documentation

## 2015-08-25 NOTE — Progress Notes (Signed)
Subjective:    Patient ID: Jeffrey Ortiz, male    DOB: 01/31/1956, 60 y.o.   MRN: [id number]  Congestive Heart Failure Presents for initial visit. The disease course has been improving. Associated symptoms include fatigue ("getting better") and palpitations ("at times"). Pertinent negatives include no abdominal pain, chest pain, chest pressure, edema, muscle weakness, orthopnea or shortness of breath. The symptoms have been improving. Past treatments include ACE inhibitors, beta blockers, digoxin and salt and fluid restriction. The treatment provided significant relief. Compliance with prior treatments has been good. His past medical history is significant for arrhythmia, DM and HTN. There is no history of DVT. He has multiple 1st degree relatives with heart disease.  Hypertension This is a chronic problem. The current episode started more than 1 year ago. The problem is unchanged. The problem is controlled. Associated symptoms include palpitations ("at times"). Pertinent negatives include no chest pain, headaches, neck pain, peripheral edema or shortness of breath. There are no associated agents to hypertension. Risk factors for coronary artery disease include diabetes mellitus, family history, dyslipidemia, obesity and male gender. Past treatments include ACE inhibitors, beta blockers, calcium channel blockers, diuretics and lifestyle changes. The current treatment provides significant improvement. There are no compliance problems.  Hypertensive end-organ damage includes heart failure.    Past Medical History  Diagnosis Date  . Essential hypertension   . Diabetes mellitus without complication ([place])   . GERD (gastroesophageal reflux disease)   . Overweight   . Hyperlipidemia     Past Surgical History  Procedure Laterality Date  . Tee without cardioversion N/A 08/13/2015    Procedure: TRANSESOPHAGEAL ECHOCARDIOGRAM (TEE) and cardioversion;  Surgeon: Minna Merritts, MD;  Location: [facility] ORS;   Service: Cardiovascular;  Laterality: N/A;    Family History  Problem Relation Age of Onset  . Heart attack Father     died in his 78's w/ cancer but dx CAD in his 72's.  . Cancer Father   . Heart attack Brother     died in early 86's.  . Other Mother     alive @ 48    Social History  Substance Use Topics  . Smoking status: Never Smoker   . Smokeless tobacco: Never Used  . Alcohol Use: 0.0 oz/week    0 Standard drinks or equivalent per week     Comment: occasional drink - rare (< 1/wk).    No Known Allergies  Prior to Admission medications   Medication Sig Start Date End Date Taking? Authorizing Provider  amiodarone (PACERONE) 400 MG tablet Take 1 tablet (400 mg total) by mouth 2 (two) times daily. 08/15/15  Yes Fritzi Mandes, MD  aspirin EC 81 MG tablet Take 81 mg by mouth daily.   Yes Historical Provider, MD  carvedilol (COREG) 12.5 MG tablet Take 1 tablet (12.5 mg total) by mouth 2 (two) times daily with a meal. 08/15/15  Yes Fritzi Mandes, MD  digoxin (LANOXIN) 0.25 MG tablet Take 1 tablet (0.25 mg total) by mouth daily. 08/15/15  Yes Fritzi Mandes, MD  diltiazem (CARDIZEM SR) 60 MG 12 hr capsule Take 1 capsule (60 mg total) by mouth every 12 (twelve) hours. 08/15/15  Yes Fritzi Mandes, MD  furosemide (LASIX) 40 MG tablet Take 1 tablet (40 mg total) by mouth daily. Patient taking differently: Take 40 mg by mouth every other day.  08/16/15  Yes Fritzi Mandes, MD  lisinopril (PRINIVIL,ZESTRIL) 10 MG tablet Take 3 tablets (30 mg total) by mouth daily. 08/15/15  Yes Sona  Posey Pronto, MD  metFORMIN (GLUCOPHAGE) 500 MG tablet Take by mouth 2 (two) times daily with a meal. May increase to 1000mg  two times a day as needed   Yes Historical Provider, MD  potassium chloride SA (K-DUR,KLOR-CON) 20 MEQ tablet Take 1 tablet (20 mEq total) by mouth daily as needed. Patient taking differently: Take 20 mEq by mouth daily.  08/22/15  Yes Minna Merritts, MD  rivaroxaban (XARELTO) 20 MG TABS tablet Take 1 tablet (20 mg  total) by mouth daily with supper. 08/15/15  Yes Fritzi Mandes, MD     Review of Systems  Constitutional: Positive for fatigue ("getting better"). Negative for appetite change.  HENT: Positive for congestion. Negative for postnasal drip and sore throat.   Eyes: Negative.   Respiratory: Negative for cough, chest tightness and shortness of breath.   Cardiovascular: Positive for palpitations ("at times"). Negative for chest pain and leg swelling.  Gastrointestinal: Negative for abdominal pain and abdominal distention.  Endocrine: Negative.   Genitourinary: Negative.   Musculoskeletal: Negative for back pain, muscle weakness and neck pain.  Skin: Negative.   Allergic/Immunologic: Negative.   Neurological: Negative for dizziness, light-headedness and headaches.  Hematological: Negative for adenopathy. Does not bruise/bleed easily.  Psychiatric/Behavioral: Negative for sleep disturbance (sleeping on 1 pillow) and dysphoric mood. The patient is not nervous/anxious.        Objective:   Physical Exam  Constitutional: He is oriented to person, place, and time. He appears well-developed and well-nourished.  HENT:  Head: Normocephalic and atraumatic.  Eyes: Conjunctivae are normal. Pupils are equal, round, and reactive to light.  Neck: Normal range of motion. Neck supple.  Cardiovascular: An irregular rhythm present. Bradycardia present.   Pulmonary/Chest: Effort normal. He has no wheezes. He has no rales.  Abdominal: Soft. He exhibits no distension. There is no tenderness.  Musculoskeletal: He exhibits no edema or tenderness.  Neurological: He is alert and oriented to person, place, and time.  Skin: Skin is warm and dry.  Psychiatric: He has a normal mood and affect. His behavior is normal. Thought content normal.  Nursing note and vitals reviewed.   BP 129/69 mmHg  Pulse 62  Resp 20  Ht 6' (1.829 m)  Wt 220 lb (99.791 kg)  BMI 29.83 kg/m2  SpO2 99%       Assessment & Plan:  1:  Chronic heart failure with reduced ejection fraction- Patient presents with a mild amount of fatigue with moderate exertion (Class II). Denies any shortness of breath, chest pain, swelling or difficulty sleeping. He is already weighing himself daily and says that his weight has been stable. His diuretic has recently been reduced to every other day with parameters that if he gains >2 pounds overnight or >5 pounds in a week to take an additional diuretic. He is not adding any salt to his food and has been reading food labels. Discussed the importance of closely following a 2000mg  sodium diet and written dietary information was given to him. Low sodium cookbook was also given to him. Discussed cardiac rehab with him and a brochure was given to him about this. Could consider changing his lisinopril to entresto in the future. [facility] PharmD went in and reviewed medications with him. 2: HTN- Blood pressure looks good today. Continue medications at this time. 3: Atrial fibrillation- Currently irregular and bradycardic (heart rate 40's-60's). He is decreasing his amiodarone to 200mg  twice daily once his blood pressure cuff arrives in the mail. Had cardioversion in the past that was  unsuccessful. Has an appointment with Dr. Caryl Comes in a few months to discuss ablation. In addition to amiodarone, he continues to take carvedilol, digoxin, diltiazem and xarelto. 4: Sleep apnea- Is scheduled for a sleep study on 09/24/15. 5: Diabetes- Currently taking metformin and says that his morning glucose yesterday was 122. Follows closely with his PCP regarding this.  Medication list that patient brought was verbally reviewed.  Return here in 3 months or sooner for any questions/problems before then.

## 2015-08-25 NOTE — Patient Instructions (Signed)
Continue weighing daily and call for an overnight weight gain of > 2 pounds or a weekly weight gain of >5 pounds. 

## 2015-08-26 ENCOUNTER — Ambulatory Visit: Payer: Self-pay

## 2015-08-28 ENCOUNTER — Telehealth: Payer: Self-pay | Admitting: Cardiovascular Disease

## 2015-08-28 NOTE — Telephone Encounter (Signed)
[facility] forms placed in nurse box.

## 2015-08-30 ENCOUNTER — Telehealth: Payer: Self-pay | Admitting: Physician Assistant

## 2015-08-30 MED ORDER — AMIODARONE HCL 200 MG PO TABS: 200.0000 mg | ORAL_TABLET | Freq: Two times a day (BID) | ORAL | Status: DC

## 2015-08-30 NOTE — Telephone Encounter (Signed)
Paged by answering service, patient need Amiodarone Rx. He is still taking 400mg  BID despite recent recommendation per note 08/22/15. "We will decrease the amiodarone down to 200 mg twice a day Recommended he closely monitor his heart rate at home and if heart rate increases more than 110 bpm, suggested he go back on high-dose amiodarone 400 mg twice a day until seen by Dr. Caryl Comes".   Discussed new dosage with patient and he understands it. Give rx of amio 200mg  BID.

## 2015-09-04 ENCOUNTER — Encounter: Payer: Self-pay | Admitting: Internal Medicine

## 2015-09-04 ENCOUNTER — Encounter: Payer: Self-pay | Admitting: *Deleted

## 2015-09-04 ENCOUNTER — Ambulatory Visit (INDEPENDENT_AMBULATORY_CARE_PROVIDER_SITE_OTHER): Payer: Self-pay | Admitting: Internal Medicine

## 2015-09-04 VITALS — BP 114/78 | HR 57 | Ht 72.0 in | Wt 217.5 lb

## 2015-09-04 DIAGNOSIS — I484 Atypical atrial flutter: Secondary | ICD-10-CM

## 2015-09-04 DIAGNOSIS — I5022 Chronic systolic (congestive) heart failure: Secondary | ICD-10-CM

## 2015-09-04 DIAGNOSIS — Z01812 Encounter for preprocedural laboratory examination: Secondary | ICD-10-CM

## 2015-09-04 MED ORDER — AMIODARONE HCL 200 MG PO TABS: 200.0000 mg | ORAL_TABLET | Freq: Every day | ORAL | Status: DC

## 2015-09-04 NOTE — Progress Notes (Signed)
Patient Care Team: Coral Spikes, DO as PCP - General (Family Medicine) Alisa Graff, FNP as Nurse Practitioner (Family Medicine) Minna Merritts, MD as Consulting Physician (Cardiology) Flora Lipps, MD as Consulting Physician (Pulmonary Disease) Deboraha Sprang, MD as Consulting Physician (Cardiology)   HPI  Jeffrey Ortiz is a 60 y.o. male Seen following a hospital consultation for atrial flutter.  He had presented with acute congestive failure with rapid ventricular rates and his atrial flutter. He underwent TEE guided cardioversion that was complicated by reversion to atrial fibrillation and then back to atrial flutter. Ejection fraction at that time was 30%. Transthoracic echo demonstrated EF of 25-30% with mild LAE (44/1.8/30)  He was started on amiodarone.  Was back in and out of atrial flutter over the ensuing hours because of significant improvement in rate control he was discharged  Records and Results Reviewed hosp records  Past Medical History  Diagnosis Date  . Essential hypertension   . Diabetes mellitus without complication ([place])   . GERD (gastroesophageal reflux disease)   . Overweight   . Hyperlipidemia   . Arrhythmia     atrial fibrillation  . CHF (congestive heart failure) [facility])     Past Surgical History  Procedure Laterality Date  . Tee without cardioversion N/A 08/13/2015    Procedure: TRANSESOPHAGEAL ECHOCARDIOGRAM (TEE) and cardioversion;  Surgeon: Minna Merritts, MD;  Location: [facility] ORS;  Service: Cardiovascular;  Laterality: N/A;    Current Outpatient Prescriptions  Medication Sig Dispense Refill  . amiodarone (PACERONE) 200 MG tablet Take 1 tablet (200 mg total) by mouth 2 (two) times daily. 60 tablet 3  . aspirin EC 81 MG tablet Take 81 mg by mouth daily.    . carvedilol (COREG) 12.5 MG tablet Take 1 tablet (12.5 mg total) by mouth 2 (two) times daily with a meal. 60 tablet 1  . digoxin (LANOXIN) 0.25 MG tablet Take 1 tablet (0.25 mg  total) by mouth daily. 30 tablet 1  . diltiazem (CARDIZEM SR) 120 MG 12 hr capsule Take 120 mg by mouth daily.    . furosemide (LASIX) 40 MG tablet Take 1 tablet (40 mg total) by mouth daily. (Patient taking differently: Take 40 mg by mouth every other day. ) 30 tablet 1  . lisinopril (PRINIVIL,ZESTRIL) 10 MG tablet Take 3 tablets (30 mg total) by mouth daily. 30 tablet 1  . metFORMIN (GLUCOPHAGE) 500 MG tablet Take by mouth 2 (two) times daily with a meal. May increase to 1000mg  two times a day as needed    . potassium chloride SA (K-DUR,KLOR-CON) 20 MEQ tablet Take 1 tablet (20 mEq total) by mouth daily as needed. (Patient taking differently: Take 20 mEq by mouth daily. ) 30 tablet 6  . rivaroxaban (XARELTO) 20 MG TABS tablet Take 1 tablet (20 mg total) by mouth daily with supper. 30 tablet 2   No current facility-administered medications for this visit.    No Known Allergies    Review of Systems negative except from HPI and PMH  Physical Exam BP 114/78 mmHg  Pulse 57  Ht 6' (1.829 m)  Wt 217 lb 8 oz (98.657 kg)  BMI 29.49 kg/m2 Well developed and well nourished in no acute distress HENT normal E scleral and icterus clear Neck Supple JVP flat; carotids brisk and full Clear to ausculation  Regular rate and rhythm, no murmurs gallops or rub Soft with active bowel sounds Left testicular tenderness without ecchymosis or hematoma No clubbing  cyanosis  Edema Alert and oriented, grossly normal motor and sensory function Skin Warm and Dry  ECG demonstrates atrial flutter at 57 intervals-/10/46  Assessment and  Plan Atrial flutter-recurrent-typical  Cardiomyopathy question rate related  Left testicular tenderness  Anger management issues  The patient has persistent atrial flutter. We have discussed treatment options including medical therapy versus catheter ablation, the latter in the context of the likelihood of atrial fibrillation developing subsequently in the range of  25-75%. We will plan to pursue catheter ablation the risks and benefits of which we have discussed. 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  Procedure will be done on his apixoban as well as under general anesthesia.  I've advised him to follow-up with his primary care physician go to urgent care regarding left testicular tenderness.  We have also discussed extensively the issues related to anger management and its association in a man with depression

## 2015-09-04 NOTE — Patient Instructions (Addendum)
Medication Instructions: - Your physician has recommended you make the following change in your medication:  1) Decrease amiodarone to 200 mg once daily 2) Stop aspirin 3) Stop digoxin  Labwork: - Please return for pre-procedure lab work & an EKG with the nurse on 09/24/15  Procedures/Testing: - Your physician has requested that you have an echocardiogram- in 2 weeks. Echocardiography is a painless test that uses sound waves to create images of your heart. It provides your doctor with information about the size and shape of your heart and how well your heart's chambers and valves are working. This procedure takes approximately one hour. There are no restrictions for this procedure.  - Your physician has recommended that you have an ablation- in 3 weeks. Catheter ablation is a medical procedure used to treat some cardiac arrhythmias (irregular heartbeats). During catheter ablation, a long, thin, flexible tube is put into a blood vessel in your groin (upper thigh), or neck. This tube is called an ablation catheter. It is then guided to your heart through the blood vessel. Radio frequency waves destroy small areas of heart tissue where abnormal heartbeats may cause an arrhythmia to start. Please see the instruction sheet given to you today.  Follow-Up: - Your physician recommends that you schedule a follow-up appointment in: 4 weeks (from 09/26/15) with Dr. Caryl Comes.  Any Additional Special Instructions Will Be Listed Below (If Applicable).     If you need a refill on your cardiac medications before your next appointment, please call your pharmacy.

## 2015-09-12 ENCOUNTER — Other Ambulatory Visit: Payer: Self-pay | Admitting: Internal Medicine

## 2015-09-12 MED ORDER — AMIODARONE HCL 200 MG PO TABS: 200.0000 mg | ORAL_TABLET | Freq: Every day | ORAL | 11 refills | Status: DC

## 2015-09-15 ENCOUNTER — Encounter: Payer: Self-pay | Admitting: Family Medicine

## 2015-09-15 ENCOUNTER — Ambulatory Visit (INDEPENDENT_AMBULATORY_CARE_PROVIDER_SITE_OTHER): Payer: Self-pay | Admitting: Family Medicine

## 2015-09-15 VITALS — BP 126/76 | HR 56 | Temp 97.9°F | Ht 69.5 in | Wt 214.8 lb

## 2015-09-15 DIAGNOSIS — E119 Type 2 diabetes mellitus without complications: Secondary | ICD-10-CM

## 2015-09-15 DIAGNOSIS — N509 Disorder of male genital organs, unspecified: Secondary | ICD-10-CM

## 2015-09-15 DIAGNOSIS — I4892 Unspecified atrial flutter: Secondary | ICD-10-CM | POA: Insufficient documentation

## 2015-09-15 DIAGNOSIS — Z0000 Encounter for general adult medical examination without abnormal findings: Secondary | ICD-10-CM

## 2015-09-15 DIAGNOSIS — I10 Essential (primary) hypertension: Secondary | ICD-10-CM

## 2015-09-15 DIAGNOSIS — N5089 Other specified disorders of the male genital organs: Secondary | ICD-10-CM

## 2015-09-15 DIAGNOSIS — E785 Hyperlipidemia, unspecified: Secondary | ICD-10-CM | POA: Insufficient documentation

## 2015-09-15 NOTE — Assessment & Plan Note (Signed)
Stable. Followed by cardiology. Upcoming ablation. Continue diltiazem, Coreg, amiodarone, Xarelto.

## 2015-09-15 NOTE — Assessment & Plan Note (Addendum)
Stable. Continue lisinopril, Lasix.

## 2015-09-15 NOTE — Assessment & Plan Note (Signed)
Patient in need of several preventative healthcare items. Discussed today. We'll wait at this time given lack of insurance.

## 2015-09-15 NOTE — Patient Instructions (Addendum)
Continue your current medications.  Follow up in Dec/January (when the insurance goes through).  Take care  Dr. Lacinda Axon   Health Maintenance, Male A healthy lifestyle and preventative care can promote health and wellness.  Maintain regular health, dental, and eye exams.  Eat a healthy diet. Foods like vegetables, fruits, whole grains, low-fat dairy products, and lean protein foods contain the nutrients you need and are low in calories. Decrease your intake of foods high in solid fats, added sugars, and salt. Get information about a proper diet from your health care provider, if necessary.  Regular physical exercise is one of the most important things you can do for your health. Most adults should get at least 150 minutes of moderate-intensity exercise (any activity that increases your heart rate and causes you to sweat) each week. In addition, most adults need muscle-strengthening exercises on 2 or more days a week.   Maintain a healthy weight. The body mass index (BMI) is a screening tool to identify possible weight problems. It provides an estimate of body fat based on height and weight. Your health care provider can find your BMI and can help you achieve or maintain a healthy weight. For males 20 years and older:  A BMI below 18.5 is considered underweight.  A BMI of 18.5 to 24.9 is normal.  A BMI of 25 to 29.9 is considered overweight.  A BMI of 30 and above is considered obese.  Maintain normal blood lipids and cholesterol by exercising and minimizing your intake of saturated fat. Eat a balanced diet with plenty of fruits and vegetables. Blood tests for lipids and cholesterol should begin at age 65 and be repeated every 5 years. If your lipid or cholesterol levels are high, you are over age 5, or you are at high risk for heart disease, you may need your cholesterol levels checked more frequently.Ongoing high lipid and cholesterol levels should be treated with medicines if diet and  exercise are not working.  If you smoke, find out from your health care provider how to quit. If you do not use tobacco, do not start.  Lung cancer screening is recommended for adults aged 5-80 years who are at high risk for developing lung cancer because of a history of smoking. A yearly low-dose CT scan of the lungs is recommended for people who have at least a 30-pack-year history of smoking and are current smokers or have quit within the past 15 years. A pack year of smoking is smoking an average of 1 pack of cigarettes a day for 1 year (for example, a 30-pack-year history of smoking could mean smoking 1 pack a day for 30 years or 2 packs a day for 15 years). Yearly screening should continue until the smoker has stopped smoking for at least 15 years. Yearly screening should be stopped for people who develop a health problem that would prevent them from having lung cancer treatment.  If you choose to drink alcohol, do not have more than 2 drinks per day. One drink is considered to be 12 oz (360 mL) of beer, 5 oz (150 mL) of wine, or 1.5 oz (45 mL) of liquor.  Avoid the use of street drugs. Do not share needles with anyone. Ask for help if you need support or instructions about stopping the use of drugs.  High blood pressure causes heart disease and increases the risk of stroke. High blood pressure is more likely to develop in:  People who have blood pressure in the  end of the normal range (100-139/85-89 mm Hg).  People who are overweight or obese.  People who are African American.  If you are 52-50 years of age, have your blood pressure checked every 3-5 years. If you are 15 years of age or older, have your blood pressure checked every year. You should have your blood pressure measured twice--once when you are at a hospital or clinic, and once when you are not at a hospital or clinic. Record the average of the two measurements. To check your blood pressure when you are not at a hospital or  clinic, you can use:  An automated blood pressure machine at a pharmacy.  A home blood pressure monitor.  If you are 48-42 years old, ask your health care provider if you should take aspirin to prevent heart disease.  Diabetes screening involves taking a blood sample to check your fasting blood sugar level. This should be done once every 3 years after age 44 if you are at a normal weight and without risk factors for diabetes. Testing should be considered at a younger age or be carried out more frequently if you are overweight and have at least 1 risk factor for diabetes.  Colorectal cancer can be detected and often prevented. Most routine colorectal cancer screening begins at the age of 15 and continues through age 40. However, your health care provider may recommend screening at an earlier age if you have risk factors for colon cancer. On a yearly basis, your health care provider may provide home test kits to check for hidden blood in the stool. A small camera at the end of a tube may be used to directly examine the colon (sigmoidoscopy or colonoscopy) to detect the earliest forms of colorectal cancer. Talk to your health care provider about this at age 78 when routine screening begins. A direct exam of the colon should be repeated every 5-10 years through age 45, unless early forms of precancerous polyps or small growths are found.  People who are at an increased risk for hepatitis B should be screened for this virus. You are considered at high risk for hepatitis B if:  You were born in a country where hepatitis B occurs often. Talk with your health care provider about which countries are considered high risk.  Your parents were born in a high-risk country and you have not received a shot to protect against hepatitis B (hepatitis B vaccine).  You have HIV or AIDS.  You use needles to inject street drugs.  You live with, or have sex with, someone who has hepatitis B.  You are a man who has  sex with other men (MSM).  You get hemodialysis treatment.  You take certain medicines for conditions like cancer, organ transplantation, and autoimmune conditions.  Hepatitis C blood testing is recommended for all people born from [birth year] through 1964 and any individual with known risk factors for hepatitis C.  Healthy men should no longer receive prostate-specific antigen (PSA) blood tests as part of routine cancer screening. Talk to your health care provider about prostate cancer screening.  Testicular cancer screening is not recommended for adolescents or adult males who have no symptoms. Screening includes self-exam, a health care provider exam, and other screening tests. Consult with your health care provider about any symptoms you have or any concerns you have about testicular cancer.  Practice safe sex. Use condoms and avoid high-risk sexual practices to reduce the spread of sexually transmitted infections (STIs).  You should  be screened for STIs, including gonorrhea and chlamydia if:  You are sexually active and are younger than 24 years.  You are older than 24 years, and your health care provider tells you that you are at risk for this type of infection.  Your sexual activity has changed since you were last screened, and you are at an increased risk for chlamydia or gonorrhea. Ask your health care provider if you are at risk.  If you are at risk of being infected with HIV, it is recommended that you take a prescription medicine daily to prevent HIV infection. This is called pre-exposure prophylaxis (PrEP). You are considered at risk if:  You are a man who has sex with other men (MSM).  You are a heterosexual man who is sexually active with multiple partners.  You take drugs by injection.  You are sexually active with a partner who has HIV.  Talk with your health care provider about whether you are at high risk of being infected with HIV. If you choose to begin PrEP, you should  first be tested for HIV. You should then be tested every 3 months for as long as you are taking PrEP.  Use sunscreen. Apply sunscreen liberally and repeatedly throughout the day. You should seek shade when your shadow is shorter than you. Protect yourself by wearing long sleeves, pants, a wide-brimmed hat, and sunglasses year round whenever you are outdoors.  Tell your health care provider of new moles or changes in moles, especially if there is a change in shape or color. Also, tell your health care provider if a mole is larger than the size of a pencil eraser.  A one-time screening for abdominal aortic aneurysm (AAA) and surgical repair of large AAAs by ultrasound is recommended for men aged 89-75 years who are current or former smokers.  Stay current with your vaccines (immunizations).   This information is not intended to replace advice given to you by your health care provider. Make sure you discuss any questions you have with your health care provider.   Document Released: 07/31/2007 Document Revised: 02/22/2014 Document Reviewed: 06/29/2010 Elsevier Interactive Patient Education [employer].

## 2015-09-15 NOTE — Assessment & Plan Note (Signed)
Controlled per patient glucose readings. A1c today. Continue metformin 500 mg twice a day. May need increased based on A1c. Foot exam performed today. In need of eye exam. Waiting on preventative care items as patient has no insurance at this time.

## 2015-09-15 NOTE — Progress Notes (Signed)
Pre visit review using our clinic review tool, if applicable. No additional management support is needed unless otherwise documented below in the visit note. 

## 2015-09-15 NOTE — Progress Notes (Signed)
Subjective:  Patient ID: Jeffrey Ortiz, male    DOB: 1956/02/15  Age: 60 y.o. MRN: [id number]  CC: Establish care - Issues are below  HPI Jeffrey Ortiz is a 60 y.o. male presents to the clinic today to establish care. Issues/concerns are below.  HTN  Currently well controlled on lisinopril, Lasix, diltiazem, Coreg (Dilt, Coreg are more for rate control at this point).  DM  Blood sugars readings - 95-145 per patient.  Hypoglycemia - No.  Medications - Metformin 500 mg BID.  Adverse effects - None.  Compliance - Yes.  Preventative care  Eye exam - In need of.  Foot exam - In need of. Will perform today.  Last A1C - In need of.  Urine microalbumin - In need of.  HLD  Most recent LDL of 105.  Diabetic and ASCVD 10 year risk is 18.6%.  Will discuss statin therapy today.  Atrial flutter/Atrial fibrillation  Recently admitted in June for atrial flutter with rapid ventricular response and congestive heart failure.  Underwent cardioversion without success.  Is currently on Diltiazem, Coreg, Amiodarone, Xarelto.  Currently followed by cardiology and is stable at this time.  Is undergoing ablation in August.  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  Patient states that it developed in his 37s after lifting something heavy.  He reports associated pain and discomfort.  He states that he's never had this worked up. He has seen a physician for this who told him that everything was fine.  No reports of bulging in the inguinal region.  His pain is worse when his testicles are not supported by tight underwear and with intercourse.  PMH, Surgical Hx, Family Hx, Social History reviewed and updated as below.  Past Medical History:  Diagnosis Date  . Arrhythmia    Atrial fib & Atrial Flutter.  . CHF (congestive heart failure) ([place])   . Diabetes mellitus without complication ([place])   . Essential hypertension   . GERD  (gastroesophageal reflux disease)   . Hyperlipidemia   . Overweight    Past Surgical History:  Procedure Laterality Date  . TEE WITHOUT CARDIOVERSION N/A 08/13/2015   Procedure: TRANSESOPHAGEAL ECHOCARDIOGRAM (TEE) and cardioversion;  Surgeon: Minna Merritts, MD;  Location: [facility] ORS;  Service: Cardiovascular;  Laterality: N/A;   Family History  Problem Relation Age of Onset  . Heart attack Father     died in his 54's w/ cancer but dx CAD in his 45's.  . Cancer Father   . Other Mother     alive @ 68  . Heart attack Brother     died in early 51's.  . Diabetes Brother    Social History  Substance Use Topics  . Smoking status: Never Smoker  . Smokeless tobacco: Never Used  . Alcohol use No   Review of Systems  Respiratory:       SOB - Resolved at this time.  Cardiovascular: Positive for palpitations.  Genitourinary: Positive for testicular pain.  All other systems reviewed and are negative.  Objective:   Today's Vitals: BP 126/76 (BP Location: Right Arm)   Pulse (!) 56   Temp 97.9 F (36.6 C) (Oral)   Ht 5' 9.5" (1.765 m)   Wt 214 lb 12 oz (97.4 kg)   SpO2 97%   BMI 31.26 kg/m   Physical Exam  Constitutional: He is oriented to person, place, and time. He appears well-developed. No distress.  HENT:  Head: Normocephalic and  atraumatic.  Mouth/Throat: Oropharynx is clear and moist.  Eyes: Conjunctivae are normal. No scleral icterus.  Neck: Neck supple.  Cardiovascular: An irregular rhythm present. Bradycardia present.   No murmur heard. Euvolemic.  Pulmonary/Chest: Effort normal. He has no wheezes. He has no rales.  Abdominal: Soft. He exhibits no distension. There is no tenderness. There is no rebound and no guarding. Hernia confirmed negative in the right inguinal area and confirmed negative in the left inguinal area.  Genitourinary:  Genitourinary Comments: Large R testicular mass. Tender to palpation. No appreciable inguinal hernia.  Musculoskeletal: Normal  range of motion.  Neurological: He is alert and oriented to person, place, and time.  Skin:  Anterior scalp - raised, quarter-sized nodule noted.   Psychiatric: He has a normal mood and affect.  Vitals reviewed.  Assessment & Plan:   Problem List Items Addressed This Visit    HTN (hypertension) - Primary (Chronic)    Stable. Continue lisinopril, Lasix.      Diabetes ([place]) (Chronic)    Controlled per patient glucose readings. A1c today. Continue metformin 500 mg twice a day. May need increased based on A1c. Foot exam performed today. In need of eye exam. Waiting on preventative care items as patient has no insurance at this time.      Relevant Orders   HgB A1c   Atrial flutter (HCC)    Stable. Followed by cardiology. Upcoming ablation. Continue diltiazem, Coreg, amiodarone, Xarelto.      Hyperlipidemia    New problem. Discussed need for treatment given diabetes and ASCVD risk score. Patient okay with treatment but he and I both agree to wait at this time as he has no insurance and is likely not going to be able to afford the medication.      Testicular mass    Chronic. Unlikely to be malignant given the length of time that he's had this. Needs ultrasound and urology referral. Waiting at this time as he has no insurance.      Preventative health care    Patient in need of several preventative healthcare items. Discussed today. We'll wait at this time given lack of insurance.       Other Visit Diagnoses   None.    Outpatient Encounter Prescriptions as of 09/15/2015  Medication Sig  . amiodarone (PACERONE) 200 MG tablet Take 1 tablet (200 mg total) by mouth daily.  . carvedilol (COREG) 12.5 MG tablet Take 1 tablet (12.5 mg total) by mouth 2 (two) times daily with a meal.  . diltiazem (CARDIZEM SR) 120 MG 12 hr capsule Take 120 mg by mouth daily.  . furosemide (LASIX) 40 MG tablet Take 1 tablet (40 mg total) by mouth daily. (Patient taking differently: Take 40  mg by mouth every other day. )  . lisinopril (PRINIVIL,ZESTRIL) 10 MG tablet Take 3 tablets (30 mg total) by mouth daily.  . metFORMIN (GLUCOPHAGE) 500 MG tablet Take by mouth 2 (two) times daily with a meal. May increase to 1000mg  two times a day as needed  . potassium chloride SA (K-DUR,KLOR-CON) 20 MEQ tablet Take 1 tablet (20 mEq total) by mouth daily as needed. (Patient taking differently: Take 20 mEq by mouth daily. )  . rivaroxaban (XARELTO) 20 MG TABS tablet Take 1 tablet (20 mg total) by mouth daily with supper.   No facility-administered encounter medications on file as of 09/15/2015.    Follow-up: Dec to [place]

## 2015-09-15 NOTE — Assessment & Plan Note (Signed)
New problem. Discussed need for treatment given diabetes and ASCVD risk score. Patient okay with treatment but he and I both agree to wait at this time as he has no insurance and is likely not going to be able to afford the medication.

## 2015-09-15 NOTE — Assessment & Plan Note (Signed)
Chronic. Unlikely to be malignant given the length of time that he's had this. Needs ultrasound and urology referral. Waiting at this time as he has no insurance.

## 2015-09-16 ENCOUNTER — Encounter: Payer: Self-pay | Admitting: Family Medicine

## 2015-09-16 LAB — HEMOGLOBIN A1C: HEMOGLOBIN A1C: 7.8 % — AB (ref 4.6–6.5)

## 2015-09-17 ENCOUNTER — Other Ambulatory Visit: Payer: Self-pay | Admitting: Family Medicine

## 2015-09-17 ENCOUNTER — Other Ambulatory Visit: Payer: Self-pay | Admitting: Internal Medicine

## 2015-09-17 DIAGNOSIS — I483 Typical atrial flutter: Secondary | ICD-10-CM

## 2015-09-17 MED ORDER — EMPAGLIFLOZIN 10 MG PO TABS: 10.0000 mg | ORAL_TABLET | Freq: Every day | ORAL | 0 refills | Status: DC

## 2015-09-17 MED ORDER — RIVAROXABAN 20 MG PO TABS: 20.0000 mg | ORAL_TABLET | Freq: Every day | ORAL | 0 refills | Status: DC

## 2015-09-17 MED ORDER — METFORMIN HCL 500 MG PO TABS
ORAL_TABLET | ORAL | 0 refills | Status: DC
Start: 2015-09-17 — End: 2016-03-08

## 2015-09-20 ENCOUNTER — Other Ambulatory Visit: Payer: Self-pay | Admitting: Internal Medicine

## 2015-09-23 ENCOUNTER — Encounter: Payer: Self-pay | Admitting: Physician Assistant

## 2015-09-24 ENCOUNTER — Other Ambulatory Visit: Payer: Self-pay

## 2015-09-24 ENCOUNTER — Ambulatory Visit (INDEPENDENT_AMBULATORY_CARE_PROVIDER_SITE_OTHER): Payer: Self-pay

## 2015-09-24 ENCOUNTER — Other Ambulatory Visit (INDEPENDENT_AMBULATORY_CARE_PROVIDER_SITE_OTHER): Payer: Self-pay | Admitting: *Deleted

## 2015-09-24 ENCOUNTER — Encounter: Payer: Self-pay | Admitting: Physician Assistant

## 2015-09-24 ENCOUNTER — Ambulatory Visit (INDEPENDENT_AMBULATORY_CARE_PROVIDER_SITE_OTHER): Payer: Self-pay | Admitting: *Deleted

## 2015-09-24 ENCOUNTER — Ambulatory Visit: Payer: Self-pay | Attending: Pulmonary Disease

## 2015-09-24 VITALS — BP 150/122 | HR 80 | Temp 97.6°F | Resp 20 | Ht 72.0 in | Wt 216.5 lb

## 2015-09-24 DIAGNOSIS — I10 Essential (primary) hypertension: Secondary | ICD-10-CM | POA: Insufficient documentation

## 2015-09-24 DIAGNOSIS — G4710 Hypersomnia, unspecified: Secondary | ICD-10-CM | POA: Insufficient documentation

## 2015-09-24 DIAGNOSIS — I484 Atypical atrial flutter: Secondary | ICD-10-CM

## 2015-09-24 DIAGNOSIS — I480 Paroxysmal atrial fibrillation: Secondary | ICD-10-CM | POA: Insufficient documentation

## 2015-09-24 DIAGNOSIS — I4892 Unspecified atrial flutter: Secondary | ICD-10-CM | POA: Insufficient documentation

## 2015-09-24 DIAGNOSIS — Z01812 Encounter for preprocedural laboratory examination: Secondary | ICD-10-CM

## 2015-09-24 DIAGNOSIS — I509 Heart failure, unspecified: Secondary | ICD-10-CM | POA: Insufficient documentation

## 2015-09-24 LAB — ECHOCARDIOGRAM LIMITED
Height: 72 in
Weight: 3464 oz

## 2015-09-24 NOTE — Patient Instructions (Addendum)
1.) Reason for visit: Pre-op EKG/Labs  2.) Name of MD requesting visit: Dr. Caryl Comes  3.) Assessment and plan per MD: Remain on Xarelto at this time.

## 2015-09-25 ENCOUNTER — Ambulatory Visit: Payer: Self-pay | Attending: Family | Admitting: Family

## 2015-09-25 ENCOUNTER — Telehealth: Payer: Self-pay | Admitting: Internal Medicine

## 2015-09-25 ENCOUNTER — Encounter: Payer: Self-pay | Admitting: Family

## 2015-09-25 VITALS — BP 117/87 | HR 51 | Resp 18 | Ht 72.0 in | Wt 213.0 lb

## 2015-09-25 DIAGNOSIS — E119 Type 2 diabetes mellitus without complications: Secondary | ICD-10-CM

## 2015-09-25 DIAGNOSIS — Z809 Family history of malignant neoplasm, unspecified: Secondary | ICD-10-CM | POA: Insufficient documentation

## 2015-09-25 DIAGNOSIS — I509 Heart failure, unspecified: Secondary | ICD-10-CM | POA: Insufficient documentation

## 2015-09-25 DIAGNOSIS — K219 Gastro-esophageal reflux disease without esophagitis: Secondary | ICD-10-CM | POA: Insufficient documentation

## 2015-09-25 DIAGNOSIS — Z6828 Body mass index (BMI) 28.0-28.9, adult: Secondary | ICD-10-CM | POA: Insufficient documentation

## 2015-09-25 DIAGNOSIS — Z79899 Other long term (current) drug therapy: Secondary | ICD-10-CM | POA: Insufficient documentation

## 2015-09-25 DIAGNOSIS — E663 Overweight: Secondary | ICD-10-CM | POA: Insufficient documentation

## 2015-09-25 DIAGNOSIS — Z8249 Family history of ischemic heart disease and other diseases of the circulatory system: Secondary | ICD-10-CM | POA: Insufficient documentation

## 2015-09-25 DIAGNOSIS — G4730 Sleep apnea, unspecified: Secondary | ICD-10-CM

## 2015-09-25 DIAGNOSIS — E785 Hyperlipidemia, unspecified: Secondary | ICD-10-CM | POA: Insufficient documentation

## 2015-09-25 DIAGNOSIS — Z9889 Other specified postprocedural states: Secondary | ICD-10-CM | POA: Insufficient documentation

## 2015-09-25 DIAGNOSIS — I499 Cardiac arrhythmia, unspecified: Secondary | ICD-10-CM | POA: Insufficient documentation

## 2015-09-25 DIAGNOSIS — Z833 Family history of diabetes mellitus: Secondary | ICD-10-CM | POA: Insufficient documentation

## 2015-09-25 DIAGNOSIS — I480 Paroxysmal atrial fibrillation: Secondary | ICD-10-CM

## 2015-09-25 DIAGNOSIS — I5022 Chronic systolic (congestive) heart failure: Secondary | ICD-10-CM

## 2015-09-25 DIAGNOSIS — I110 Hypertensive heart disease with heart failure: Secondary | ICD-10-CM | POA: Insufficient documentation

## 2015-09-25 DIAGNOSIS — I10 Essential (primary) hypertension: Secondary | ICD-10-CM

## 2015-09-25 LAB — CBC WITH DIFFERENTIAL/PLATELET
Basophils Absolute: 0 10*3/uL (ref 0.0–0.2)
Basos: 0 %
EOS (ABSOLUTE): 0.1 10*3/uL (ref 0.0–0.4)
EOS: 2 %
HEMATOCRIT: 47.5 % (ref 37.5–51.0)
Hemoglobin: 16.1 g/dL (ref 12.6–17.7)
IMMATURE GRANULOCYTES: 0 %
Immature Grans (Abs): 0 10*3/uL (ref 0.0–0.1)
Lymphocytes Absolute: 1.7 10*3/uL (ref 0.7–3.1)
Lymphs: 18 %
MCH: 31.1 pg (ref 26.6–33.0)
MCHC: 33.9 g/dL (ref 31.5–35.7)
MCV: 92 fL (ref 79–97)
MONOS ABS: 0.7 10*3/uL (ref 0.1–0.9)
Monocytes: 8 %
NEUTROS PCT: 72 %
Neutrophils Absolute: 6.7 10*3/uL (ref 1.4–7.0)
PLATELETS: 235 10*3/uL (ref 150–379)
RBC: 5.17 x10E6/uL (ref 4.14–5.80)
RDW: 13.4 % (ref 12.3–15.4)
WBC: 9.3 10*3/uL (ref 3.4–10.8)

## 2015-09-25 LAB — BASIC METABOLIC PANEL
BUN/Creatinine Ratio: 27 — ABNORMAL HIGH (ref 9–20)
BUN: 34 mg/dL — AB (ref 6–24)
CALCIUM: 8.5 mg/dL — AB (ref 8.7–10.2)
CHLORIDE: 104 mmol/L (ref 96–106)
CO2: 18 mmol/L (ref 18–29)
Creatinine, Ser: 1.28 mg/dL — ABNORMAL HIGH (ref 0.76–1.27)
GFR calc Af Amer: 71 mL/min/{1.73_m2} (ref >59)
GFR calc non Af Amer: 61 mL/min/{1.73_m2} (ref >59)
GLUCOSE: 182 mg/dL — AB (ref 65–99)
Potassium: 4.7 mmol/L (ref 3.5–5.2)
Sodium: 141 mmol/L (ref 134–144)

## 2015-09-25 NOTE — Progress Notes (Signed)
Subjective:    Patient ID: Jeffrey Ortiz, male    DOB: [birth year], 60 y.o.   MRN: [id number]  Congestive Heart Failure  Presents for follow-up visit. Associated symptoms include fatigue (getting better) and palpitations (at times). Pertinent negatives include no abdominal pain, chest pain, edema, orthopnea or shortness of breath. The symptoms have been stable.  Hypertension  This is a chronic problem. The current episode started more than 1 year ago. The problem is controlled. Associated symptoms include palpitations (at times). Pertinent negatives include no chest pain, neck pain, peripheral edema or shortness of breath. There are no associated agents to hypertension. Risk factors for coronary artery disease include diabetes mellitus, dyslipidemia, family history and male gender. Past treatments include ACE inhibitors, beta blockers, diuretics and lifestyle changes. The current treatment provides significant improvement. There are no compliance problems.  Hypertensive end-organ damage includes heart failure. There is no history of angina.   Past Medical History:  Diagnosis Date  . Arrhythmia    Atrial fib & Atrial Flutter.  . CHF (congestive heart failure) ([place])   . Diabetes mellitus without complication ([place])   . Essential hypertension   . GERD (gastroesophageal reflux disease)   . Hyperlipidemia   . Overweight     Past Surgical History:  Procedure Laterality Date  . TEE WITHOUT CARDIOVERSION N/A 08/13/2015   Procedure: TRANSESOPHAGEAL ECHOCARDIOGRAM (TEE) and cardioversion;  Surgeon: Minna Merritts, MD;  Location: [facility] ORS;  Service: Cardiovascular;  Laterality: N/A;    Family History  Problem Relation Age of Onset  . Heart attack Father     died in his 22's w/ cancer but dx CAD in his 49's.  . Cancer Father   . Other Mother     alive @ 46  . Heart attack Brother     died in early 67's.  . Diabetes Brother     Social History  Substance Use Topics  . Smoking status:  Never Smoker  . Smokeless tobacco: Never Used  . Alcohol use No    No Known Allergies  Prior to Admission medications   Medication Sig Start Date End Date Taking? Authorizing Provider  amiodarone (PACERONE) 200 MG tablet Take 1 tablet (200 mg total) by mouth daily. Patient taking differently: Take 200 mg by mouth 2 (two) times daily.  09/12/15  Yes Deboraha Sprang, MD  carvedilol (COREG) 12.5 MG tablet Take 1 tablet (12.5 mg total) by mouth 2 (two) times daily with a meal. 08/15/15  Yes Fritzi Mandes, MD  diltiazem (CARDIZEM SR) 60 MG 12 hr capsule Take 60 mg by mouth 2 (two) times daily.   Yes Historical Provider, MD  empagliflozin (JARDIANCE) 10 MG TABS tablet Take 10 mg by mouth daily. Lot - [id number], Exp 12/18, [facility] [id number] 09/17/15  Yes Coral Spikes, DO  furosemide (LASIX) 40 MG tablet Take 1 tablet (40 mg total) by mouth daily. Patient taking differently: Take 40 mg by mouth every other day.  08/16/15  Yes Fritzi Mandes, MD  lisinopril (PRINIVIL,ZESTRIL) 10 MG tablet Take 3 tablets (30 mg total) by mouth daily. 08/15/15  Yes Fritzi Mandes, MD  metFORMIN (GLUCOPHAGE) 500 MG tablet 1 tablet by mouth twice daily. Increase to 2 tablets BID after 1 week. Patient taking differently: Take 1,000 mg by mouth 2 (two) times daily with a meal.  09/17/15  Yes Coral Spikes, DO  potassium chloride SA (K-DUR,KLOR-CON) 20 MEQ tablet Take 1 tablet (20 mEq total) by mouth daily as needed. Patient taking differently:  Take 20 mEq by mouth daily.  08/22/15  Yes Minna Merritts, MD  rivaroxaban (XARELTO) 20 MG TABS tablet Take 1 tablet (20 mg total) by mouth daily with supper. 09/17/15  Yes Coral Spikes, DO      Review of Systems  Constitutional: Positive for fatigue (getting better). Negative for appetite change.  HENT: Negative for congestion, rhinorrhea and sore throat.   Eyes: Negative.   Respiratory: Negative for chest tightness, shortness of breath and wheezing.   Cardiovascular: Positive for palpitations (at times).  Negative for chest pain and leg swelling.  Gastrointestinal: Negative for abdominal distention and abdominal pain.  Endocrine: Negative.   Genitourinary: Negative.   Musculoskeletal: Negative for back pain and neck pain.  Skin: Negative.   Allergic/Immunologic: Negative.   Neurological: Negative for dizziness and light-headedness.  Hematological: Negative for adenopathy. Does not bruise/bleed easily.  Psychiatric/Behavioral: Negative for dysphoric mood and sleep disturbance (snoring at times). The patient is not nervous/anxious.        Objective:   Physical Exam  Constitutional: He is oriented to person, place, and time. He appears well-developed and well-nourished.  HENT:  Head: Normocephalic and atraumatic.  Eyes: Conjunctivae are normal. Pupils are equal, round, and reactive to light.  Neck: Normal range of motion. Neck supple.  Cardiovascular: An irregular rhythm present. Bradycardia present.   Pulmonary/Chest: Effort normal. He has no wheezes. He has no rales.  Abdominal: Soft. He exhibits no distension. There is no tenderness.  Musculoskeletal: He exhibits no edema or tenderness.  Neurological: He is alert and oriented to person, place, and time.  Skin: Skin is warm and dry.  Psychiatric: He has a normal mood and affect. His behavior is normal. Thought content normal.  Nursing note and vitals reviewed.   BP 117/87   Pulse (!) 51   Resp 18   Ht 6' (1.829 m)   Wt 213 lb (96.6 kg)   SpO2 98%   BMI 28.89 kg/m        Assessment & Plan:  1: Chronic heart failure with reduced ejection fraction- Patient presents with fatigue with moderate exertion (Class II) which quickly improves upon rest. He does feel like the fatigue is getting much better. He denies any shortness of breath or swelling in his legs/abdomen. He continues to weigh himself daily and has noticed a gradual weight loss because he has changed his diet and is working on increasing his activity. By our scale, he's  lost 7 pounds since he was last here on 08/25/15. He is not adding salt to his food and is reading food labels. Discussed possibly changing his lisinopril to entresto but will defer right now because patient is having an ablation done tomorrow. 2: HTN- Blood pressure looks great today. Continue medications at this time. 3: Atrial fibrillation- Currently rate controlled on amiodarone, diltiazem and carvedilol. Continues to also take rivaroxaban. Is scheduled for an ablation tomorrow. 4: Diabetes- He says that his glucose this morning was in the 150's. Follows closely with his PCP regarding this. 5: Sleep apnea- He had his sleep study done last night.   Medication bottles were reviewed with the patient.  Return here in 5 months or sooner for any questions/problems before then. Patient will have insurance after the 1st of the year so would like to push out his appointment.

## 2015-09-25 NOTE — Telephone Encounter (Signed)
Pt has a question regarding his medication and his cardioversion. Please call.

## 2015-09-25 NOTE — Telephone Encounter (Signed)
Patient returning call. Please call for questions on upcoming ablation.

## 2015-09-25 NOTE — Patient Instructions (Signed)
Continue weighing daily and call for an overnight weight gain of > 2 pounds or a weekly weight gain of >5 pounds. 

## 2015-09-25 NOTE — Telephone Encounter (Signed)
Left message on machine for patient to contact the office.   

## 2015-09-25 NOTE — Telephone Encounter (Signed)
S/w pt who inquires of instructions for tomorrow's ablation. Letter was given to patient with instructions at July OV. He requests I send a copy to MyChart. Verbally reviewed instructions and have forwarded information to him.

## 2015-09-26 ENCOUNTER — Encounter (HOSPITAL_COMMUNITY)
Admission: RE | Disposition: A | Discharge status: Home / Self Care | Payer: Self-pay | Source: Ambulatory Visit | Attending: Internal Medicine

## 2015-09-26 ENCOUNTER — Encounter (HOSPITAL_COMMUNITY): Payer: Self-pay | Admitting: Anesthesiology

## 2015-09-26 ENCOUNTER — Ambulatory Visit (HOSPITAL_COMMUNITY): Payer: Medicaid Other | Admitting: Anesthesiology

## 2015-09-26 ENCOUNTER — Ambulatory Visit (HOSPITAL_COMMUNITY)
Admission: RE | Admit: 2015-09-26 | Discharge: 2015-09-27 | Disposition: A | Discharge status: Home / Self Care | Payer: Medicaid Other | Source: Ambulatory Visit | Attending: Internal Medicine | Admitting: Internal Medicine

## 2015-09-26 DIAGNOSIS — E785 Hyperlipidemia, unspecified: Secondary | ICD-10-CM | POA: Diagnosis not present

## 2015-09-26 DIAGNOSIS — I4892 Unspecified atrial flutter: Secondary | ICD-10-CM | POA: Diagnosis present

## 2015-09-26 DIAGNOSIS — Z7901 Long term (current) use of anticoagulants: Secondary | ICD-10-CM | POA: Insufficient documentation

## 2015-09-26 DIAGNOSIS — E119 Type 2 diabetes mellitus without complications: Secondary | ICD-10-CM

## 2015-09-26 DIAGNOSIS — Z6829 Body mass index (BMI) 29.0-29.9, adult: Secondary | ICD-10-CM | POA: Diagnosis not present

## 2015-09-26 DIAGNOSIS — G4730 Sleep apnea, unspecified: Secondary | ICD-10-CM | POA: Diagnosis present

## 2015-09-26 DIAGNOSIS — I5022 Chronic systolic (congestive) heart failure: Secondary | ICD-10-CM | POA: Insufficient documentation

## 2015-09-26 DIAGNOSIS — I4891 Unspecified atrial fibrillation: Secondary | ICD-10-CM | POA: Insufficient documentation

## 2015-09-26 DIAGNOSIS — Z7984 Long term (current) use of oral hypoglycemic drugs: Secondary | ICD-10-CM | POA: Insufficient documentation

## 2015-09-26 DIAGNOSIS — I483 Typical atrial flutter: Secondary | ICD-10-CM

## 2015-09-26 DIAGNOSIS — I471 Supraventricular tachycardia: Secondary | ICD-10-CM | POA: Diagnosis not present

## 2015-09-26 DIAGNOSIS — K219 Gastro-esophageal reflux disease without esophagitis: Secondary | ICD-10-CM | POA: Diagnosis not present

## 2015-09-26 DIAGNOSIS — E669 Obesity, unspecified: Secondary | ICD-10-CM | POA: Diagnosis not present

## 2015-09-26 DIAGNOSIS — I110 Hypertensive heart disease with heart failure: Secondary | ICD-10-CM | POA: Insufficient documentation

## 2015-09-26 DIAGNOSIS — Z7982 Long term (current) use of aspirin: Secondary | ICD-10-CM | POA: Insufficient documentation

## 2015-09-26 DIAGNOSIS — I429 Cardiomyopathy, unspecified: Secondary | ICD-10-CM | POA: Insufficient documentation

## 2015-09-26 DIAGNOSIS — I10 Essential (primary) hypertension: Secondary | ICD-10-CM | POA: Diagnosis present

## 2015-09-26 HISTORY — DX: Sleep apnea, unspecified: G47.30

## 2015-09-26 HISTORY — PX: ELECTROPHYSIOLOGIC STUDY: SHX172A

## 2015-09-26 HISTORY — DX: Unspecified atrial flutter: I48.92

## 2015-09-26 HISTORY — DX: Essential (primary) hypertension: I10

## 2015-09-26 HISTORY — DX: Type 2 diabetes mellitus without complications: E11.9

## 2015-09-26 HISTORY — DX: Chronic systolic (congestive) heart failure: I50.22

## 2015-09-26 LAB — GLUCOSE, CAPILLARY
GLUCOSE-CAPILLARY: 113 mg/dL — AB (ref 65–99)
Glucose-Capillary: 140 mg/dL — ABNORMAL HIGH (ref 65–99)

## 2015-09-26 SURGERY — A-FLUTTER/A-TACH/SVT ABLATION
Anesthesia: General

## 2015-09-26 MED ORDER — MEPERIDINE HCL 25 MG/ML IJ SOLN: 6.2500 mg | INTRAMUSCULAR | Status: DC | PRN

## 2015-09-26 MED ORDER — SODIUM CHLORIDE 0.9 % IV SOLN
INTRAVENOUS | Status: DC
  Administered 2015-09-26 (×2): via INTRAVENOUS

## 2015-09-26 MED ORDER — SODIUM CHLORIDE 0.9 % IV SOLN: 250.0000 mL | INTRAVENOUS | Status: DC | PRN

## 2015-09-26 MED ORDER — METOCLOPRAMIDE HCL 5 MG/ML IJ SOLN: 10.0000 mg | Freq: Once | INTRAMUSCULAR | Status: DC | PRN

## 2015-09-26 MED ORDER — FUROSEMIDE 40 MG PO TABS
40.0000 mg | ORAL_TABLET | ORAL | Status: DC
  Filled 2015-09-26: qty 1

## 2015-09-26 MED ORDER — CANAGLIFLOZIN 100 MG PO TABS
100.0000 mg | ORAL_TABLET | Freq: Every day | ORAL | Status: DC
  Administered 2015-09-27: 100 mg via ORAL
  Filled 2015-09-26: qty 1

## 2015-09-26 MED ORDER — ONDANSETRON HCL 4 MG/2ML IJ SOLN
INTRAMUSCULAR | Status: DC | PRN
  Administered 2015-09-26 (×2): 4 mg via INTRAVENOUS

## 2015-09-26 MED ORDER — FENTANYL CITRATE (PF) 100 MCG/2ML IJ SOLN: 25.0000 ug | INTRAMUSCULAR | Status: DC | PRN

## 2015-09-26 MED ORDER — FUROSEMIDE 40 MG PO TABS: 40.0000 mg | ORAL_TABLET | ORAL | Status: DC

## 2015-09-26 MED ORDER — ARTIFICIAL TEARS OP OINT
TOPICAL_OINTMENT | OPHTHALMIC | Status: DC | PRN
  Administered 2015-09-26: 1 via OPHTHALMIC

## 2015-09-26 MED ORDER — FENTANYL CITRATE (PF) 100 MCG/2ML IJ SOLN
INTRAMUSCULAR | Status: DC | PRN
  Administered 2015-09-26 (×2): 50 ug via INTRAVENOUS

## 2015-09-26 MED ORDER — METFORMIN HCL 500 MG PO TABS
1000.0000 mg | ORAL_TABLET | Freq: Two times a day (BID) | ORAL | Status: DC
  Administered 2015-09-26 – 2015-09-27 (×2): 1000 mg via ORAL
  Filled 2015-09-26 (×2): qty 2

## 2015-09-26 MED ORDER — HEPARIN (PORCINE) IN NACL 2-0.9 UNIT/ML-% IJ SOLN
INTRAMUSCULAR | Status: AC
  Filled 2015-09-26: qty 500

## 2015-09-26 MED ORDER — ACETAMINOPHEN 325 MG PO TABS: 650.0000 mg | ORAL_TABLET | ORAL | Status: DC | PRN

## 2015-09-26 MED ORDER — CARVEDILOL 12.5 MG PO TABS
12.5000 mg | ORAL_TABLET | Freq: Two times a day (BID) | ORAL | Status: DC
  Administered 2015-09-26 – 2015-09-27 (×2): 12.5 mg via ORAL
  Filled 2015-09-26 (×2): qty 1

## 2015-09-26 MED ORDER — MIDAZOLAM HCL 5 MG/5ML IJ SOLN
INTRAMUSCULAR | Status: DC | PRN
  Administered 2015-09-26: 2 mg via INTRAVENOUS

## 2015-09-26 MED ORDER — SODIUM CHLORIDE 0.9% FLUSH: 3.0000 mL | INTRAVENOUS | Status: DC | PRN

## 2015-09-26 MED ORDER — RIVAROXABAN 20 MG PO TABS
20.0000 mg | ORAL_TABLET | Freq: Every day | ORAL | Status: DC
  Administered 2015-09-26: 20 mg via ORAL
  Filled 2015-09-26: qty 1

## 2015-09-26 MED ORDER — POTASSIUM CHLORIDE CRYS ER 20 MEQ PO TBCR
20.0000 meq | EXTENDED_RELEASE_TABLET | Freq: Every day | ORAL | Status: DC
  Administered 2015-09-26: 20 meq via ORAL
  Filled 2015-09-26 (×2): qty 1

## 2015-09-26 MED ORDER — LIDOCAINE HCL 4 % MT SOLN
OROMUCOSAL | Status: DC | PRN
  Administered 2015-09-26: 4 mL via TOPICAL

## 2015-09-26 MED ORDER — SODIUM CHLORIDE 0.9% FLUSH
3.0000 mL | Freq: Two times a day (BID) | INTRAVENOUS | Status: DC
  Administered 2015-09-26 – 2015-09-27 (×2): 3 mL via INTRAVENOUS

## 2015-09-26 MED ORDER — ONDANSETRON HCL 4 MG/2ML IJ SOLN: 4.0000 mg | Freq: Four times a day (QID) | INTRAMUSCULAR | Status: DC | PRN

## 2015-09-26 MED ORDER — HEPARIN (PORCINE) IN NACL 2-0.9 UNIT/ML-% IJ SOLN
INTRAMUSCULAR | Status: DC | PRN
  Administered 2015-09-26: 11:00:00

## 2015-09-26 MED ORDER — SUGAMMADEX SODIUM 200 MG/2ML IV SOLN
INTRAVENOUS | Status: DC | PRN
  Administered 2015-09-26: 200 mg via INTRAVENOUS

## 2015-09-26 MED ORDER — ROCURONIUM BROMIDE 100 MG/10ML IV SOLN
INTRAVENOUS | Status: DC | PRN
  Administered 2015-09-26: 50 mg via INTRAVENOUS

## 2015-09-26 MED ORDER — BUPIVACAINE HCL (PF) 0.25 % IJ SOLN
INTRAMUSCULAR | Status: AC
  Filled 2015-09-26: qty 60

## 2015-09-26 MED ORDER — LISINOPRIL 20 MG PO TABS
30.0000 mg | ORAL_TABLET | Freq: Every day | ORAL | Status: DC
  Administered 2015-09-26 – 2015-09-27 (×2): 30 mg via ORAL
  Filled 2015-09-26 (×2): qty 1

## 2015-09-26 MED ORDER — DEXTROSE 5 % IV SOLN
INTRAVENOUS | Status: DC | PRN
  Administered 2015-09-26: 25 ug/min via INTRAVENOUS

## 2015-09-26 MED ORDER — PROPOFOL 10 MG/ML IV BOLUS
INTRAVENOUS | Status: DC | PRN
  Administered 2015-09-26: 150 mg via INTRAVENOUS

## 2015-09-26 MED ORDER — BUPIVACAINE HCL (PF) 0.25 % IJ SOLN
INTRAMUSCULAR | Status: DC | PRN
  Administered 2015-09-26: 20 mL

## 2015-09-26 MED ORDER — LIDOCAINE HCL (CARDIAC) 20 MG/ML IV SOLN
INTRAVENOUS | Status: DC | PRN
  Administered 2015-09-26: 90 mg via INTRAVENOUS

## 2015-09-26 SURGICAL SUPPLY — 13 items
BAG SNAP BAND KOVER 36X36 (MISCELLANEOUS) ×2 IMPLANT
CATH BLAZERPRIME XP LG CV 10MM (ABLATOR) ×2 IMPLANT
CATH DUODECA/ISMUS 7FR REPROC (CATHETERS) ×2 IMPLANT
CATH OCTAPOLOR 6F 125CM 2-5-2 (CATHETERS) ×2 IMPLANT
CATH QUAD COURNAND 5FR (CATHETERS) ×2 IMPLANT
PACK EP LATEX FREE (CUSTOM PROCEDURE TRAY) ×1
PACK EP LF (CUSTOM PROCEDURE TRAY) ×1 IMPLANT
PAD DEFIB LIFELINK (PAD) ×2 IMPLANT
SHEATH ATRIAL FLUTTER SAFL 8F (SHEATH) ×2 IMPLANT
SHEATH PINNACLE 6F 10CM (SHEATH) ×2 IMPLANT
SHEATH PINNACLE 7F 10CM (SHEATH) ×2 IMPLANT
SHEATH PINNACLE 8F 10CM (SHEATH) ×4 IMPLANT
SHIELD RADPAD SCOOP 12X17 (MISCELLANEOUS) ×2 IMPLANT

## 2015-09-26 NOTE — Interval H&P Note (Signed)
History and Physical Interval Note:  09/26/2015 9:21 AM  Jeffrey Ortiz  has presented today for surgery, with the diagnosis of aflutter  The various methods of treatment have been discussed with the patient and family. After consideration of risks, benefits and other options for treatment, the patient has consented to  Procedure(s): A-Flutter (N/A) as a surgical intervention .  The patient's history has been reviewed, patient examined, no change in status, stable for surgery.  I have reviewed the patient's chart and labs.  Questions were answered to the patient's satisfaction.     Virl Axe  Still in atrial flutter but left ventricular function has improved modestly 30-35%-- ->>>40-45%.

## 2015-09-26 NOTE — Anesthesia Preprocedure Evaluation (Addendum)
Anesthesia Evaluation  Patient identified by MRN, date of birth, ID band Patient awake    Reviewed: Allergy & Precautions, NPO status , Patient's Chart, lab work & pertinent test results, reviewed documented beta blocker date and time   Airway Mallampati: III       Dental no notable dental hx. (+) Teeth Intact, Caps   Pulmonary sleep apnea and Continuous Positive Airway Pressure Ventilation ,    Pulmonary exam normal breath sounds clear to auscultation       Cardiovascular hypertension, Pt. on medications and Pt. on home beta blockers +CHF  Normal cardiovascular exam+ dysrhythmias Atrial Fibrillation  Rhythm:Regular Rate:Normal  EKG- A. Fib, possible inferior infarct  Echo- 09/24/15 Left ventricle: The cavity size was normal. Wall thickness was   increased in a pattern of mild LVH. Systolic function was mildly   to moderately reduced. The estimated ejection fraction was in the   range of 40% to 45%. Diffuse hypokinesis. - Right ventricle: Systolic function was mildly reduced.    Neuro/Psych negative neurological ROS  negative psych ROS   GI/Hepatic Neg liver ROS, GERD  Medicated and Controlled,  Endo/Other  diabetes, Well Controlled, Type 2, Oral Hypoglycemic Agents  Renal/GU Renal InsufficiencyRenal disease  negative genitourinary   Musculoskeletal negative musculoskeletal ROS (+)   Abdominal   Peds  Hematology On xarelto for anticoagulation- last dose   Anesthesia Other Findings   Reproductive/Obstetrics                            Anesthesia Physical Anesthesia Plan  ASA: III  Anesthesia Plan: General   Post-op Pain Management:    Induction: Intravenous and Cricoid pressure planned  Airway Management Planned: Oral ETT  Additional Equipment:   Intra-op Plan:   Post-operative Plan: Extubation in OR  Informed Consent: I have reviewed the patients History and Physical, chart,  labs and discussed the procedure including the risks, benefits and alternatives for the proposed anesthesia with the patient or authorized representative who has indicated his/her understanding and acceptance.   Dental advisory given  Plan Discussed with: Anesthesiologist, CRNA and Surgeon  Anesthesia Plan Comments:         Anesthesia Quick Evaluation

## 2015-09-26 NOTE — Transfer of Care (Signed)
Immediate Anesthesia Transfer of Care Note  Patient: Jeffrey Ortiz  Procedure(s) Performed: Procedure(s): A-Flutter (N/A)  Patient Location: PACU  Anesthesia Type:General  Level of Consciousness: awake, oriented, sedated, patient cooperative and responds to stimulation  Airway & Oxygen Therapy: Patient Spontanous Breathing and Patient connected to nasal cannula oxygen  Post-op Assessment: Report given to RN, Post -op Vital signs reviewed and stable, Patient moving all extremities and Patient moving all extremities X 4  Post vital signs: Reviewed and stable  Last Vitals:  Vitals:   09/26/15 0720  BP: (!) 130/96  Pulse: (!) 117  Resp: 18  Temp: 36.8 C    Last Pain:  Vitals:   09/26/15 0720  TempSrc: Oral         Complications: No apparent anesthesia complications

## 2015-09-26 NOTE — H&P (View-Only) (Signed)
Patient Care Team: Coral Spikes, DO as PCP - General (Family Medicine) Alisa Graff, FNP as Nurse Practitioner (Family Medicine) Minna Merritts, MD as Consulting Physician (Cardiology) Flora Lipps, MD as Consulting Physician (Pulmonary Disease) Deboraha Sprang, MD as Consulting Physician (Cardiology)   HPI  Jeffrey Ortiz is a 60 y.o. male Seen following a hospital consultation for atrial flutter.  He had presented with acute congestive failure with rapid ventricular rates and his atrial flutter. He underwent TEE guided cardioversion that was complicated by reversion to atrial fibrillation and then back to atrial flutter. Ejection fraction at that time was 30%. Transthoracic echo demonstrated EF of 25-30% with mild LAE (44/1.8/30)  He was started on amiodarone.  Was back in and out of atrial flutter over the ensuing hours because of significant improvement in rate control he was discharged  Records and Results Reviewed hosp records  Past Medical History  Diagnosis Date  . Essential hypertension   . Diabetes mellitus without complication ([place])   . GERD (gastroesophageal reflux disease)   . Overweight   . Hyperlipidemia   . Arrhythmia     atrial fibrillation  . CHF (congestive heart failure) [facility])     Past Surgical History  Procedure Laterality Date  . Tee without cardioversion N/A 08/13/2015    Procedure: TRANSESOPHAGEAL ECHOCARDIOGRAM (TEE) and cardioversion;  Surgeon: Minna Merritts, MD;  Location: [facility] ORS;  Service: Cardiovascular;  Laterality: N/A;    Current Outpatient Prescriptions  Medication Sig Dispense Refill  . amiodarone (PACERONE) 200 MG tablet Take 1 tablet (200 mg total) by mouth 2 (two) times daily. 60 tablet 3  . aspirin EC 81 MG tablet Take 81 mg by mouth daily.    . carvedilol (COREG) 12.5 MG tablet Take 1 tablet (12.5 mg total) by mouth 2 (two) times daily with a meal. 60 tablet 1  . digoxin (LANOXIN) 0.25 MG tablet Take 1 tablet (0.25 mg  total) by mouth daily. 30 tablet 1  . diltiazem (CARDIZEM SR) 120 MG 12 hr capsule Take 120 mg by mouth daily.    . furosemide (LASIX) 40 MG tablet Take 1 tablet (40 mg total) by mouth daily. (Patient taking differently: Take 40 mg by mouth every other day. ) 30 tablet 1  . lisinopril (PRINIVIL,ZESTRIL) 10 MG tablet Take 3 tablets (30 mg total) by mouth daily. 30 tablet 1  . metFORMIN (GLUCOPHAGE) 500 MG tablet Take by mouth 2 (two) times daily with a meal. May increase to 1000mg  two times a day as needed    . potassium chloride SA (K-DUR,KLOR-CON) 20 MEQ tablet Take 1 tablet (20 mEq total) by mouth daily as needed. (Patient taking differently: Take 20 mEq by mouth daily. ) 30 tablet 6  . rivaroxaban (XARELTO) 20 MG TABS tablet Take 1 tablet (20 mg total) by mouth daily with supper. 30 tablet 2   No current facility-administered medications for this visit.    No Known Allergies    Review of Systems negative except from HPI and PMH  Physical Exam BP 114/78 mmHg  Pulse 57  Ht 6' (1.829 m)  Wt 217 lb 8 oz (98.657 kg)  BMI 29.49 kg/m2 Well developed and well nourished in no acute distress HENT normal E scleral and icterus clear Neck Supple JVP flat; carotids brisk and full Clear to ausculation  Regular rate and rhythm, no murmurs gallops or rub Soft with active bowel sounds Left testicular tenderness without ecchymosis or hematoma No clubbing  cyanosis  Edema Alert and oriented, grossly normal motor and sensory function Skin Warm and Dry  ECG demonstrates atrial flutter at 57 intervals-/10/46  Assessment and  Plan Atrial flutter-recurrent-typical  Cardiomyopathy question rate related  Left testicular tenderness  Anger management issues  The patient has persistent atrial flutter. We have discussed treatment options including medical therapy versus catheter ablation, the latter in the context of the likelihood of atrial fibrillation developing subsequently in the range of  25-75%. We will plan to pursue catheter ablation the risks and benefits of which we have discussed. 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  Procedure will be done on his apixoban as well as under general anesthesia.  I've advised him to follow-up with his primary care physician go to urgent care regarding left testicular tenderness.  We have also discussed extensively the issues related to anger management and its association in a man with depression

## 2015-09-26 NOTE — Anesthesia Postprocedure Evaluation (Signed)
Anesthesia Post Note  Patient: [facility]  Procedure(s) Performed: Procedure(s) (LRB): A-Flutter (N/A)  Patient location during evaluation: Cath Lab Anesthesia Type: General Level of consciousness: awake and alert Pain management: pain level controlled Vital Signs Assessment: post-procedure vital signs reviewed and stable Respiratory status: spontaneous breathing, nonlabored ventilation, respiratory function stable and patient connected to nasal cannula oxygen Cardiovascular status: blood pressure returned to baseline and stable Postop Assessment: no signs of nausea or vomiting Anesthetic complications: no    Last Vitals:  Vitals:   09/26/15 1310 09/26/15 1315  BP: 124/72 121/67  Pulse: 67 68  Resp: 14 12  Temp:  37.1 C    Last Pain:  Vitals:   09/26/15 0720  TempSrc: Oral                 Emmerich Cryer A

## 2015-09-26 NOTE — Plan of Care (Signed)
Problem: Education: Goal: Knowledge of [facility] Education information/materials will improve Outcome: Completed/Met Date Met: 09/26/15 Pt educated throughout entire admission regarding tests, procedures, labs, medications, and available resources.   Problem: Safety: Goal: Ability to remain free from injury will improve Outcome: Completed/Met Date Met: 09/26/15 Pt has remained free from injury during this admission   Problem: Consults Goal: EP Study Patient Education (See Patient Education module for education specifics.)  Outcome: Completed/Met Date Met: 09/26/15 Pt educated prior to admission regarding a flutter ablation

## 2015-09-26 NOTE — Op Note (Signed)
[name], Jeffrey Ortiz NO.:  [id number]  MEDICAL RECORD NO.:  [id number]  LOCATION:  [facility]                         FACILITY:  [facility]  PHYSICIAN:  Deboraha Sprang, MD, FACCDATE OF BIRTH:  [birth year]  DATE OF PROCEDURE:  09/26/2015 DATE OF DISCHARGE:                              OPERATIVE REPORT   PREOPERATIVE DIAGNOSIS:  Atrial flutter.  POSTOPERATIVE DIAGNOSIS:  Atrial flutter.  PROCEDURE:  Invasive electrophysiological study - comprehensive with SVT ablation.  DESCRIPTION OF PROCEDURE:  Following obtaining informed consent, the patient was brought to the electrophysiology laboratory and placed on the fluoroscopic table in supine position.  He was submitted for general anesthesia.  After routine prep and drape with adjunctive local anesthesia, the patient underwent cardiac catheterization.  Tolerated the procedure, the catheters were removed.  The patient was transferred to the holding area for sheath removal in stable condition.  Following insertion of the catheters, the stimulation protocol included incremental atrial pacing. Incremental ventricular pacing. Entrainment mapping.  Surface leads 1, aVF and V1 were monitored continuously throughout the procedure.  BASELINE MEASUREMENTS AND INTRACARDIAC INTERVALS:  Rhythm:  Atrial flutter; RR interval 563 milliseconds; AA interval 280 milliseconds; QRS duration 74 milliseconds; QTc interval 350 milliseconds; AH:  N/A; HV: 45 milliseconds. Final:  Rhythm:  Sinus; RR interval 1112 milliseconds; PR interval 196 milliseconds; P-wave duration 126 milliseconds; QRS duration 80 milliseconds; QTc interval 456 milliseconds; AH:  N/M; HV:  N/M; although, it was stable throughout.  END-TIDAL AV NODAL FUNCTION:  AV Wenckebach was 650 milliseconds. VA conduction was dissociated at 600 milliseconds. No evidence of dual AV nodal physiology was identified. No evidence of an accessory pathway. Ventricular response to  programmed stimulation was normal for ventricular stimulation as described.  The patient presented to the lab in atrial flutter.  Entrainment mapping confirmed cavotricuspid isthmus conduction.  RF energy was delivered across the cavotricuspid isthmus interrupting atrial flutter and terminating it, subsequently developing bidirectional isthmus conduction block.  A total of 10 minutes of RF energy was applied across the cavotricuspid isthmus.  Fluoroscopy time; 12.9 minutes of fluoroscopy was utilized.  IMPRESSION: 1. Sinus bradycardia. 2. Abnormal atrial function manifested by sustained atrial flutter. 3. Abnormal atrioventricular nodal function manifested by prolonged AV     Wenckebach cycle length likely iatrogenic. 4. Normal His-Purkinje system function. 5. No accessory pathway. 6. Normal ventricular response to programmed stimulation.  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  The substrate was modified with ablation across the cavotricuspid isthmus.  Sensitivity of long-term bidirectional block is mitigated by the presence of amiodarone.  We will observe the patient overnight with maintained anticoagulation.  We will discontinue his amiodarone and anticipate maintaining anticoagulation for 2-3 months until we have relative confidence that there was no recurrence of his atrial arrhythmia.     Deboraha Sprang, MD, [facility]     SCK/MEDQ  D:  09/26/2015  T:  09/26/2015  Job:  [id number]

## 2015-09-26 NOTE — Progress Notes (Addendum)
Site area: rt groin fv sheaths and lt groin fv sheaths Site Prior to Removal:  Level 0 Pressure Applied For:  15 minutes each side Manual:   yes Patient Status During Pull:  stable Post Pull Site:  Level  0 Post Pull Instructions Given:  yes Post Pull Pulses Present: yes Dressing Applied:  Small tegaderm rt and lt groin Bedrest begins @ 1310 Comments:  IV saline locked

## 2015-09-26 NOTE — Anesthesia Procedure Notes (Signed)
Procedure Name: Intubation Date/Time: 09/26/2015 10:18 AM Performed by: Jacquiline Doe A Pre-anesthesia Checklist: Patient identified, Emergency Drugs available, Suction available and Patient being monitored Patient Re-evaluated:Patient Re-evaluated prior to inductionOxygen Delivery Method: Circle System Utilized Preoxygenation: Pre-oxygenation with 100% oxygen Intubation Type: IV induction and Cricoid Pressure applied Ventilation: Mask ventilation without difficulty, Two handed mask ventilation required and Oral airway inserted - appropriate to patient size Laryngoscope Size: Mac and 4 Grade View: Grade II Tube type: Oral Tube size: 7.5 mm Number of attempts: 1 Airway Equipment and Method: Stylet,  Oral airway and LTA kit utilized Placement Confirmation: ETT inserted through vocal cords under direct vision,  positive ETCO2 and breath sounds checked- equal and bilateral Secured at: 23 cm Tube secured with: Tape Dental Injury: Teeth and Oropharynx as per pre-operative assessment

## 2015-09-27 ENCOUNTER — Encounter (HOSPITAL_COMMUNITY): Payer: Self-pay | Admitting: Nurse Practitioner

## 2015-09-27 DIAGNOSIS — I4891 Unspecified atrial fibrillation: Secondary | ICD-10-CM | POA: Diagnosis not present

## 2015-09-27 DIAGNOSIS — I4892 Unspecified atrial flutter: Secondary | ICD-10-CM | POA: Diagnosis not present

## 2015-09-27 DIAGNOSIS — I5022 Chronic systolic (congestive) heart failure: Secondary | ICD-10-CM | POA: Diagnosis not present

## 2015-09-27 DIAGNOSIS — I471 Supraventricular tachycardia: Secondary | ICD-10-CM | POA: Diagnosis not present

## 2015-09-27 DIAGNOSIS — I429 Cardiomyopathy, unspecified: Secondary | ICD-10-CM

## 2015-09-27 DIAGNOSIS — I483 Typical atrial flutter: Secondary | ICD-10-CM

## 2015-09-27 NOTE — Discharge Instructions (Signed)
No driving for 1 week. No lifting over 5 lbs for 1 week. No vigorous or sexual activity for 1 week. You may return to work on 10/03/15. Keep procedure site clean & dry. If you notice increased pain, swelling, bleeding or pus, call/return!  You may shower, but no soaking baths/hot tubs/pools for 1 week.   **PLEASE REMEMBER TO BRING ALL OF YOUR MEDICATIONS TO EACH OF YOUR FOLLOW-UP OFFICE VISITS.

## 2015-09-27 NOTE — Progress Notes (Signed)
Pt discharged home. Discharge instructions have been gone over with the patient. IV's removed. Pt given unit number and told to call if they have any concerns regarding their discharge instructions. Delrico Minehart V, RN   

## 2015-09-27 NOTE — Discharge Summary (Signed)
Discharge Summary    Patient ID: Jeffrey Ortiz,  MRN: [id number], DOB/AGE: 05/08/1955 60 y.o.  Admit date: 09/26/2015 Discharge date: 09/27/2015  Primary Care Provider: Coral Ortiz Primary Cardiologist: Jeffrey Pia, MD [facility])  Discharge Diagnoses    Principal Problem:   Atrial flutter ([place])  **S/P successful catheter ablation this admission. Active Problems:   Chronic systolic heart failure (HCC)   HTN (hypertension)   Diabetes (HCC)   Obesity   Esophageal reflux   Sleep apnea   Hyperlipidemia   Allergies No Known Allergies  Diagnostic Studies/Procedures    Electrophysiology study with catheter ablation for atrial flutter 8.11.2017  IMPRESSION: 1. Sinus bradycardia. 2. Abnormal atrial function manifested by sustained atrial flutter. 3. Abnormal atrioventricular nodal function manifested by prolonged AV     Wenckebach cycle length likely iatrogenic. 4. Normal His-Purkinje system function. 5. No accessory pathway. 6. Normal ventricular response to programmed stimulation.   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  The substrate was modified with ablation across the cavotricuspid isthmus.  Sensitivity of long-term bidirectional block is mitigated by the presence of amiodarone.  We will observe the patient overnight with maintained anticoagulation.  We will discontinue his amiodarone and anticipate maintaining anticoagulation for 2-3 months until we have relative confidence that there was no recurrence of his atrial arrhythmia. _____________   History of Present Illness     60 year old male with a history of hypertension, diabetes, and relatively recently diagnosed atrial flutter, first noted at [facility] in late June. At that time, he presented with a seven-day history of palpitations and dyspnea. He was found to be in atrial flutter subsequently  placed on amiodarone and xarelto. He underwent TEE and cardioversion, however returned to atrial flutter several hours later. He was found to have LV dysfunction with an EF of 25-30% diffuse hypokinesis. Rate was reasonably controlled on amiodarone and beta blocker therapy and he was subsequently discharged and followed up with electrophysiology as an outpatient in late July, at which time he remained in atrial flutter. Decision was made to pursue electrophysiologic study with catheter ablation. Repeat echo was performed August 9 and did show some improvement of LV dysfunction, with an EF of 40-45%.  Hospital Course     Consultants: None   Patient presented to the [facility] catheterization laboratory and underwent electrophysiologic study with subsequent successful ablation for atrial flutter. Patient tolerated the procedure well and postprocedure, he has been ambulating without symptoms, limitations, or recurrent arrhythmia. We will discontinue amiodarone and he will be discharged home today in good condition. He remains on oral anticoagulation with xarelto. He will follow-up with Dr. Caryl Ort[facility] in [facility] with plan for follow-up echo to reassess LV function in the setting of sinus rhythm. _____________  Discharge Vitals Blood pressure 129/82, pulse 73, temperature 97.7 F (36.5 C), temperature source Oral, resp. rate 16, height 6' (1.829 m), weight 209 lb (94.8 kg), SpO2 99 %.  Filed Weights   09/26/15 0720 09/27/15 0550  Weight: 211 lb (95.7 kg) 209 lb (94.8 kg)    Labs & Radiologic Studies    No results found.   Disposition   Pt is being discharged home today in good condition.  Follow-up Plans & Appointments    Follow-up Information    Jeffrey Axe, MD Follow up on 10/23/2015.   Specialty:  Cardiology Why:  10:00AM Contact information: [facility] [id number] [phone]  Discharge Medications   Current Discharge Medication  List    CONTINUE these medications which have NOT CHANGED   Details  carvedilol (COREG) 12.5 MG tablet Take 1 tablet (12.5 mg total) by mouth 2 (two) times daily with a meal. Qty: 60 tablet, Refills: 1    empagliflozin (JARDIANCE) 10 MG TABS tablet Take 10 mg by mouth daily. Lot - [id number], Exp 12/18, [place] [id number] Qty: 28 tablet, Refills: 0    furosemide (LASIX) 40 MG tablet Take 1 tablet (40 mg total) by mouth daily. Qty: 30 tablet, Refills: 1    lisinopril (PRINIVIL,ZESTRIL) 10 MG tablet Take 3 tablets (30 mg total) by mouth daily. Qty: 30 tablet, Refills: 1    metFORMIN (GLUCOPHAGE) 500 MG tablet 1 tablet by mouth twice daily. Increase to 2 tablets BID after 1 week. Qty: 360 tablet, Refills: 0    potassium chloride SA (K-DUR,KLOR-CON) 20 MEQ tablet Take 1 tablet (20 mEq total) by mouth daily as needed. Qty: 30 tablet, Refills: 6    rivaroxaban (XARELTO) 20 MG TABS tablet Take 1 tablet (20 mg total) by mouth daily with supper. Qty: 90 tablet, Refills: 0      STOP taking these medications     amiodarone (PACERONE) 200 MG tablet      diltiazem (CARDIZEM SR) 60 MG 12 hr capsule           Outstanding Labs/Studies   None  Duration of Discharge Encounter   Greater than 30 minutes including physician time.  Signed, Murray Hodgkins NP 09/27/2015, 1:17 PM

## 2015-09-27 NOTE — Progress Notes (Signed)
Patient Care Team: Coral Spikes, DO as PCP - General (Family Medicine) Alisa Graff, FNP as Nurse Practitioner (Family Medicine) Minna Merritts, MD as Consulting Physician (Cardiology) Flora Lipps, MD as Consulting Physician (Pulmonary Disease) Deboraha Sprang, MD as Consulting Physician (Cardiology)   HPI  [facility] is a 60 y.o. male S/p Catheter ablation of Aflutter\ Feels much better this am      Past Medical History:  Diagnosis Date  . Arrhythmia    Atrial fib & Atrial Flutter.  . Atrial flutter ([place])   . CHF (congestive heart failure) ([place])   . Essential hypertension   . Hyperlipidemia   . Hypertension   . Overweight   . Sleep apnea    "did study 2 days ago; CPAP ordered" (09/26/2015)  . Type II diabetes mellitus ([place])     Past Surgical History:  Procedure Laterality Date  . ELECTROPHYSIOLOGIC STUDY N/A 09/26/2015   Procedure: A-Flutter;  Surgeon: Deboraha Sprang, MD;  Location: [facility];  Service: Cardiovascular;  Laterality: N/A;  . TEE WITHOUT CARDIOVERSION N/A 08/13/2015   Procedure: TRANSESOPHAGEAL ECHOCARDIOGRAM (TEE) and cardioversion;  Surgeon: Minna Merritts, MD;  Location: [facility];  Service: Cardiovascular;  Laterality: N/A;    Current Facility-Administered Medications  Medication Dose Route Frequency Provider Last Rate Last Dose  . 0.9 %  sodium chloride infusion  250 mL Intravenous PRN Deboraha Sprang, MD      . acetaminophen (TYLENOL) tablet 650 mg  650 mg Oral Q4H PRN Deboraha Sprang, MD      . canagliflozin [facility]) tablet 100 mg  100 mg Oral QAC breakfast Deboraha Sprang, MD   100 mg at 09/27/15 0754  . carvedilol (COREG) tablet 12.5 mg  12.5 mg Oral BID WC Deboraha Sprang, MD   12.5 mg at 09/27/15 0754  . furosemide (LASIX) tablet 40 mg  40 mg Oral QODAY Deboraha Sprang, MD      . lisinopril (PRINIVIL,ZESTRIL) tablet 30 mg  30 mg Oral Daily Deboraha Sprang, MD   30 mg at 09/27/15 0754  . metFORMIN (GLUCOPHAGE) tablet 1,000 mg   1,000 mg Oral BID WC Deboraha Sprang, MD   1,000 mg at 09/27/15 0754  . ondansetron (ZOFRAN) injection 4 mg  4 mg Intravenous Q6H PRN Deboraha Sprang, MD      . potassium chloride SA (K-DUR,KLOR-CON) CR tablet 20 mEq  20 mEq Oral Daily Deboraha Sprang, MD   20 mEq at 09/26/15 1456  . rivaroxaban (XARELTO) tablet 20 mg  20 mg Oral Q supper Deboraha Sprang, MD   20 mg at 09/26/15 1720  . sodium chloride flush (NS) 0.9 % injection 3 mL  3 mL Intravenous Q12H Deboraha Sprang, MD   3 mL at 09/27/15 1000  . sodium chloride flush (NS) 0.9 % injection 3 mL  3 mL Intravenous PRN Deboraha Sprang, MD        No Known Allergies    Review of Systems negative except from HPI and PMH  Physical Exam BP 129/82 (BP Location: Right Arm)   Pulse 73   Temp 97.7 F (36.5 C) (Oral)   Resp 16   Ht 6' (1.829 m)   Wt 209 lb (94.8 kg)   SpO2 99%   BMI 28.35 kg/m  Well developed and well nourished in no acute distress HENT normal E scleral and icterus clear Neck Supple JVP flat; carotids brisk  and full Clear to ausculation  Regular rate and rhythm, no murmurs gallops or rub Soft with active bowel sounds Groins ok No clubbing cyanosi Edema Alert and oriented, grossly normal motor and sensory function Skin Warm and Dry  Tele sinus  Assessment and  Plan Atrial Flutter  S/p ablation  Cardiomyopathy   Presumed tachy mediated  DM    D/c today  Continue BB and ACE Will see in [place] ( scheduled) and get an echo  Instructed to keep track of his pulse as amiodarone can decrease the specificity of the end point of the ablation procedure  Instructions given

## 2015-10-01 ENCOUNTER — Telehealth: Payer: Self-pay | Admitting: *Deleted

## 2015-10-01 DIAGNOSIS — G4733 Obstructive sleep apnea (adult) (pediatric): Secondary | ICD-10-CM

## 2015-10-01 NOTE — Telephone Encounter (Signed)
Pt informed of Sleep results and the need for a CPAP. Pt states he doesn't have any insurance at this time and is asking about options to get the CPAP. Order placed for CPAP.

## 2015-10-07 ENCOUNTER — Other Ambulatory Visit: Payer: Self-pay | Admitting: *Deleted

## 2015-10-07 DIAGNOSIS — I4892 Unspecified atrial flutter: Secondary | ICD-10-CM

## 2015-10-13 ENCOUNTER — Telehealth: Payer: Self-pay

## 2015-10-13 ENCOUNTER — Telehealth: Payer: Self-pay | Admitting: Cardiovascular Disease

## 2015-10-13 NOTE — Telephone Encounter (Signed)
Patient calling in regards to the cpap and sign the paperwork. Please call patient.

## 2015-10-13 NOTE — Telephone Encounter (Signed)
-----   Message from Audree Bane sent at 10/10/2015  4:12 PM EDT ----- Regarding: RE: please help I will be out there Wednesday morning, and could do him in the other room (the stress room) while I'm there.  I have to leave by 12:30 that day. ----- Message ----- From: Blain Pais Sent: 10/10/2015  11:38 AM To: [place] Subject: please help                                    This pt has an appt with Dr. Caryl Comes on 9/7. Dr. Caryl Comes is requesting he have an echo(for repeat limited LV function) before his appt on 9/7. Could you possibly come here to accommodate this appt, due to we have no openings available.

## 2015-10-13 NOTE — Telephone Encounter (Signed)
l mom to schedule echo for 9/6,. [place] will be able to perform this test before 11:30 in the stress echo room.

## 2015-10-13 NOTE — Telephone Encounter (Signed)
This should go to pulmonary.

## 2015-10-16 ENCOUNTER — Telehealth: Payer: Self-pay | Admitting: Family Medicine

## 2015-10-16 ENCOUNTER — Other Ambulatory Visit: Payer: Self-pay | Admitting: Family Medicine

## 2015-10-16 MED ORDER — EMPAGLIFLOZIN 10 MG PO TABS: 10.0000 mg | ORAL_TABLET | Freq: Every day | ORAL | 1 refills | Status: DC

## 2015-10-16 MED ORDER — CARVEDILOL 12.5 MG PO TABS: 12.5000 mg | ORAL_TABLET | Freq: Two times a day (BID) | ORAL | 1 refills | Status: DC

## 2015-10-16 MED ORDER — RIVAROXABAN 20 MG PO TABS: 20.0000 mg | ORAL_TABLET | Freq: Every day | ORAL | 0 refills | Status: DC

## 2015-10-16 MED ORDER — EMPAGLIFLOZIN 10 MG PO TABS: 10.0000 mg | ORAL_TABLET | Freq: Every day | ORAL | 0 refills | Status: DC

## 2015-10-16 NOTE — Telephone Encounter (Signed)
Refilled, thanks

## 2015-10-16 NOTE — Telephone Encounter (Signed)
Please advise on this, thanks

## 2015-10-16 NOTE — Telephone Encounter (Signed)
Pt lvm asking for update on his rx. Coreg and Jardiance

## 2015-10-16 NOTE — Telephone Encounter (Signed)
Refilled 09/17/15. Pt last seen 09/15/15. Please advise?

## 2015-10-17 ENCOUNTER — Other Ambulatory Visit: Payer: Self-pay | Admitting: Family Medicine

## 2015-10-17 ENCOUNTER — Other Ambulatory Visit: Payer: Self-pay

## 2015-10-17 MED ORDER — EMPAGLIFLOZIN 10 MG PO TABS: 10.0000 mg | ORAL_TABLET | Freq: Every day | ORAL | 0 refills | Status: DC

## 2015-10-17 MED ORDER — POTASSIUM CHLORIDE CRYS ER 20 MEQ PO TBCR: 20.0000 meq | EXTENDED_RELEASE_TABLET | Freq: Every day | ORAL | 1 refills | Status: DC | PRN

## 2015-10-17 MED ORDER — LISINOPRIL 10 MG PO TABS: 30.0000 mg | ORAL_TABLET | Freq: Every day | ORAL | 1 refills | Status: DC

## 2015-10-17 MED ORDER — CARVEDILOL 12.5 MG PO TABS: 12.5000 mg | ORAL_TABLET | Freq: Two times a day (BID) | ORAL | 1 refills | Status: DC

## 2015-10-17 MED ORDER — EMPAGLIFLOZIN 10 MG PO TABS: 10.0000 mg | ORAL_TABLET | Freq: Every day | ORAL | 1 refills | Status: DC

## 2015-10-17 MED ORDER — FUROSEMIDE 40 MG PO TABS: 40.0000 mg | ORAL_TABLET | Freq: Every day | ORAL | 1 refills | Status: DC

## 2015-10-17 NOTE — Addendum Note (Signed)
Addended by: Bevelyn Ngo on: 10/17/2015 10:18 AM   Modules accepted: Orders

## 2015-10-17 NOTE — Telephone Encounter (Signed)
Spoke with patient, have samples from PCP, thanks

## 2015-10-17 NOTE — Telephone Encounter (Signed)
Patient requested to have samples of the jardiance,due to it taking 6-8 weeks to arrive on site where he will have it refilled.  Pt requested a call from [place] .  Pt contact [phone]

## 2015-10-17 NOTE — Telephone Encounter (Signed)
Called and [facility] for patient on Wed 10/15/15 to call to  arrange a time to come to sign forms.   ATC patient again today to set up a time for patient to come in and call went directly to voice mail again. Advised patient to call me to arrange a time to come by and sign forms. Waiting on return call from patient. Rhonda J Cobb

## 2015-10-17 NOTE — Telephone Encounter (Signed)
Pt returned call and will come to office to sign paperwork on Tues 10/21/15 between 8:00-8:15. Nothing else needed. Rhonda J Cobb

## 2015-10-22 ENCOUNTER — Ambulatory Visit (INDEPENDENT_AMBULATORY_CARE_PROVIDER_SITE_OTHER): Payer: Self-pay

## 2015-10-22 ENCOUNTER — Other Ambulatory Visit: Payer: Self-pay

## 2015-10-22 DIAGNOSIS — I4892 Unspecified atrial flutter: Secondary | ICD-10-CM

## 2015-10-22 NOTE — Telephone Encounter (Signed)
Faxed

## 2015-10-23 ENCOUNTER — Encounter: Payer: Self-pay | Admitting: Internal Medicine

## 2015-10-23 ENCOUNTER — Ambulatory Visit (INDEPENDENT_AMBULATORY_CARE_PROVIDER_SITE_OTHER): Payer: Self-pay | Admitting: Internal Medicine

## 2015-10-23 VITALS — BP 140/82 | HR 54 | Ht 72.0 in | Wt 215.0 lb

## 2015-10-23 DIAGNOSIS — I10 Essential (primary) hypertension: Secondary | ICD-10-CM

## 2015-10-23 DIAGNOSIS — I4892 Unspecified atrial flutter: Secondary | ICD-10-CM

## 2015-10-23 DIAGNOSIS — R454 Irritability and anger: Secondary | ICD-10-CM

## 2015-10-23 DIAGNOSIS — I429 Cardiomyopathy, unspecified: Secondary | ICD-10-CM

## 2015-10-23 MED ORDER — LISINOPRIL-HYDROCHLOROTHIAZIDE 20-12.5 MG PO TABS: 1.0000 | ORAL_TABLET | Freq: Every day | ORAL | 3 refills | Status: DC

## 2015-10-23 NOTE — Patient Instructions (Addendum)
Medication Instructions: - Your physician has recommended you make the following change in your medication:  1) Stop lasix 2) Stop lisinopril 3) Stop potassium 4) Stop xarelto 5) Start lisinopril/hctz 20/12.5 mg one tablet by mouth once daily  Labwork: - Your physician recommends that you return for lab work in: 2 weeks- BMP  Procedures/Testing: - none  Follow-Up: - Dr. Caryl Comes will see you back on an as needed basis.  Any Additional Special Instructions Will Be Listed Below (If Applicable).  ** obtain a pulse oximeter **   If you need a refill on your cardiac medications before your next appointment, please call your pharmacy.

## 2015-10-23 NOTE — Progress Notes (Signed)
Patient Care Team: Coral Spikes, DO as PCP - General (Family Medicine) Alisa Graff, FNP as Nurse Practitioner (Family Medicine) Minna Merritts, MD as Consulting Physician (Cardiology) Flora Lipps, MD as Consulting Physician (Pulmonary Disease) Deboraha Sprang, MD as Consulting Physician (Cardiology)   HPI  Antoniodejesus Cantley is a 60 y.o. male Seen following  atrial flutterablation  He feels like a new man; The patient denies chest pain, shortness of breath, nocturnal dyspnea, orthopnea or peripheral edema.  There have been no palpitations, lightheadedness or syncope.   Repeat echo 9/17 EF 55-60<<25/30%  Working on anger issues    Records and Results Reviewed hosp records  Past Medical History:  Diagnosis Date  . Atrial flutter ([place])    a. 8.2017 s/p RFCA.  Mar[place] Kitchen Chronic systolic CHF (congestive heart failure) ([place])    a. 09/2015 Echo: EF 40-45%, diff HK.  . Essential hypertension   . Hyperlipidemia   . Hypertension   . Overweight   . Sleep apnea    "did study 2 days ago; CPAP ordered" (09/26/2015)  . Type II diabetes mellitus ([place])     Past Surgical History:  Procedure Laterality Date  . ELECTROPHYSIOLOGIC STUDY N/A 09/26/2015   Procedure: A-Flutter;  Surgeon: Deboraha Sprang, MD;  Location: [facility];  Service: Cardiovascular;  Laterality: N/A;  . TEE WITHOUT CARDIOVERSION N/A 08/13/2015   Procedure: TRANSESOPHAGEAL ECHOCARDIOGRAM (TEE) and cardioversion;  Surgeon: Minna Merritts, MD;  Location: [facility];  Service: Cardiovascular;  Laterality: N/A;    Current Outpatient Prescriptions  Medication Sig Dispense Refill  . carvedilol (COREG) 12.5 MG tablet Take 1 tablet (12.5 mg total) by mouth 2 (two) times daily with a meal. 180 tablet 1  . empagliflozin (JARDIANCE) 10 MG TABS tablet Take 10 mg by mouth daily. [place] [id number] 0152 07 Exp 4/20 Lot - [id number] 35 tablet 0  . furosemide (LASIX) 40 MG tablet Take 1 tablet (40 mg total) by mouth daily. 90 tablet 1  .  lisinopril (PRINIVIL,ZESTRIL) 10 MG tablet Take 3 tablets (30 mg total) by mouth daily. 90 tablet 1  . metFORMIN (GLUCOPHAGE) 500 MG tablet 1 tablet by mouth twice daily. Increase to 2 tablets BID after 1 week. (Patient taking differently: Take 1,000 mg by mouth 2 (two) times daily with a meal. ) 360 tablet 0  . potassium chloride SA (K-DUR,KLOR-CON) 20 MEQ tablet Take 1 tablet (20 mEq total) by mouth daily as needed. 90 tablet 1  . rivaroxaban (XARELTO) 20 MG TABS tablet Take 1 tablet (20 mg total) by mouth daily with supper. 90 tablet 0   No current facility-administered medications for this visit.     No Known Allergies    Review of Systems negative except from HPI and PMH  Physical Exam BP 140/82 (BP Location: Left Arm, Patient Position: Sitting, Cuff Size: Normal)   Pulse (!) 54   Ht 6' (1.829 m)   Wt 215 lb (97.5 kg)   BMI 29.16 kg/m  Well developed and well nourished in no acute distress HENT normal E scleral and icterus clear Neck Supple JVP flat; carotids brisk and full Clear to ausculation  Regular rate and rhythm, no murmurs gallops or rub Soft with active bowel sounds Left testicular tenderness without ecchymosis or hematoma No clubbing cyanosis  Edema Alert and oriented, grossly normal motor and sensory function Skin Warm and Dry  ECG demonstrates sinus 57/17/08/44  Assessment and  Plan Atrial flutter-recurrent-typical  S/p ablation  Cardiomyopathy rate related  recovered  Anger management issues  Hypertension  OSA  As he had no symptoms with his Aflutter, have reviewed the importance of rhythm monitoring and have instructed in the use of taking pulse and Pulse oximeter.  He should do this daily, and if he notes change in rhythm, he should contact us or his PCP for ECG  Stop Rivaroxaban, stop lasix and potassium  Will  Change lisinopril 30/d>> Lisinopril/Hct 20/12.5   Will check Bmet in two weeks   Continue CPAP   He says he is working on his anger  issues

## 2015-11-06 ENCOUNTER — Institutional Professional Consult (permissible substitution): Payer: Self-pay | Admitting: Internal Medicine

## 2015-11-06 ENCOUNTER — Other Ambulatory Visit (INDEPENDENT_AMBULATORY_CARE_PROVIDER_SITE_OTHER): Payer: Self-pay

## 2015-11-06 DIAGNOSIS — I4892 Unspecified atrial flutter: Secondary | ICD-10-CM

## 2015-11-06 DIAGNOSIS — I429 Cardiomyopathy, unspecified: Secondary | ICD-10-CM

## 2015-11-07 LAB — BASIC METABOLIC PANEL
BUN/Creatinine Ratio: 18 (ref 9–20)
BUN: 20 mg/dL (ref 6–24)
CALCIUM: 8.7 mg/dL (ref 8.7–10.2)
CO2: 23 mmol/L (ref 18–29)
CREATININE: 1.11 mg/dL (ref 0.76–1.27)
Chloride: 103 mmol/L (ref 96–106)
GFR calc Af Amer: 84 mL/min/{1.73_m2} (ref >59)
GFR calc non Af Amer: 73 mL/min/{1.73_m2} (ref >59)
GLUCOSE: 123 mg/dL — AB (ref 65–99)
Potassium: 4.4 mmol/L (ref 3.5–5.2)
SODIUM: 140 mmol/L (ref 134–144)

## 2015-11-12 ENCOUNTER — Telehealth: Payer: Self-pay

## 2015-11-12 ENCOUNTER — Telehealth: Payer: Self-pay | Admitting: Internal Medicine

## 2015-11-12 NOTE — Telephone Encounter (Signed)
Pt is aware of lab results.

## 2015-11-12 NOTE — Telephone Encounter (Signed)
Patient notified the Xarelto has been approved by the [employer] patient assistance program.

## 2015-11-12 NOTE — Telephone Encounter (Signed)
New Message  Pt stating he received a call from the office. No telephone message listed. Please call back to discuss if needed

## 2015-12-02 ENCOUNTER — Telehealth: Payer: Self-pay | Admitting: *Deleted

## 2015-12-02 NOTE — Telephone Encounter (Signed)
You can have the patient come by to pick up.

## 2015-12-02 NOTE — Telephone Encounter (Signed)
Pt requested to know if the office has samples for jardiance  Pt contact [phone]

## 2015-12-02 NOTE — Telephone Encounter (Signed)
Patient was called, to come by for pick up

## 2015-12-04 ENCOUNTER — Other Ambulatory Visit: Payer: Self-pay | Admitting: Family Medicine

## 2015-12-04 MED ORDER — EMPAGLIFLOZIN 10 MG PO TABS: 10.0000 mg | ORAL_TABLET | Freq: Every day | ORAL | 0 refills | Status: DC

## 2016-02-18 ENCOUNTER — Ambulatory Visit: Payer: Self-pay | Admitting: Family

## 2016-03-08 ENCOUNTER — Telehealth: Payer: Self-pay | Admitting: *Deleted

## 2016-03-08 MED ORDER — EMPAGLIFLOZIN 10 MG PO TABS: ORAL_TABLET | ORAL | 0 refills | Status: DC

## 2016-03-08 MED ORDER — METFORMIN HCL 500 MG PO TABS: ORAL_TABLET | ORAL | 0 refills | Status: DC

## 2016-03-08 NOTE — Telephone Encounter (Signed)
Medications refilled and pt called told a his rx's were ready. Pt is scheduled 03/22/16 for follow up on diabetes.

## 2016-03-08 NOTE — Telephone Encounter (Signed)
Pt requested to a medication refill metformin and jardiance. 90 day supply. Pt requested a paper copy  Pharmacy [facility] Pt contact [phone]

## 2016-03-15 ENCOUNTER — Telehealth: Payer: Self-pay | Admitting: Family Medicine

## 2016-03-15 NOTE — Telephone Encounter (Signed)
Pt was calling to see if we take health choice because of his twin boys needing a PCP. He was directed to [facility] office.

## 2016-03-15 NOTE — Telephone Encounter (Signed)
Pt called and stated that he had a couple of questions to find out if Dr. Lacinda Axon takes a specific insurance. Advised pt that he needs to call the insurance company. Pt stated that he wants to speak with you and that you would know more information. Please advise, thank you!  Call pt @ [phone]

## 2016-03-16 ENCOUNTER — Ambulatory Visit: Payer: BLUE CROSS/BLUE SHIELD | Attending: Family | Admitting: Family

## 2016-03-16 ENCOUNTER — Encounter: Payer: Self-pay | Admitting: Family

## 2016-03-16 VITALS — BP 138/69 | HR 64 | Resp 18 | Ht 72.0 in | Wt 204.1 lb

## 2016-03-16 DIAGNOSIS — Z79899 Other long term (current) drug therapy: Secondary | ICD-10-CM | POA: Diagnosis not present

## 2016-03-16 DIAGNOSIS — G4730 Sleep apnea, unspecified: Secondary | ICD-10-CM | POA: Diagnosis not present

## 2016-03-16 DIAGNOSIS — I110 Hypertensive heart disease with heart failure: Secondary | ICD-10-CM | POA: Diagnosis not present

## 2016-03-16 DIAGNOSIS — E119 Type 2 diabetes mellitus without complications: Secondary | ICD-10-CM | POA: Insufficient documentation

## 2016-03-16 DIAGNOSIS — Z7984 Long term (current) use of oral hypoglycemic drugs: Secondary | ICD-10-CM | POA: Diagnosis not present

## 2016-03-16 DIAGNOSIS — I4891 Unspecified atrial fibrillation: Secondary | ICD-10-CM | POA: Insufficient documentation

## 2016-03-16 DIAGNOSIS — Z833 Family history of diabetes mellitus: Secondary | ICD-10-CM | POA: Insufficient documentation

## 2016-03-16 DIAGNOSIS — I10 Essential (primary) hypertension: Secondary | ICD-10-CM

## 2016-03-16 DIAGNOSIS — E785 Hyperlipidemia, unspecified: Secondary | ICD-10-CM | POA: Diagnosis not present

## 2016-03-16 DIAGNOSIS — I5032 Chronic diastolic (congestive) heart failure: Secondary | ICD-10-CM | POA: Diagnosis present

## 2016-03-16 DIAGNOSIS — Z8249 Family history of ischemic heart disease and other diseases of the circulatory system: Secondary | ICD-10-CM | POA: Diagnosis not present

## 2016-03-16 DIAGNOSIS — I480 Paroxysmal atrial fibrillation: Secondary | ICD-10-CM

## 2016-03-16 NOTE — Progress Notes (Signed)
Patient ID: Jeffrey Ortiz, male    DOB: 1955-06-02, 61 y.o.   MRN: [id number]  HPI Jeffrey Ortiz is a 61 y/o male with a history of DM, sleep apnea, HTN, hyperlipidemia, atrial flutter and chronic heart failure.  Last echo was done 10/22/15 and showed an EF of 55-60% along with trivial Jeffrey. EF has improved from 40-45% August 2017 and greatly improved from 25-30% June 2017.  Was admitted on 09/26/15 for an ablation due to atrial flutter. Previously admitted 08/11/15 due to afib/flutter and exacerbation of HF. Was given IV amiodarone and cardiology consult was obtained.   He presents today for a follow-up visit without any fatigue or shortness of breath. Denies any swelling in his legs or abdomen. Weighs himself daily and has lost some weight as he's increased his time at the gym. Had a successful ablation and says that he's felt "great" ever since it was done.   Past Medical History:  Diagnosis Date  . Atrial flutter ([place])    a. 8.2017 s/p RFCA.  Marland Kitchen Chronic systolic CHF (congestive heart failure) ([place])    a. 09/2015 Echo: EF 40-45%, diff HK.  . Essential hypertension   . Hyperlipidemia   . Hypertension   . Overweight   . Sleep apnea    "did study 2 days ago; CPAP ordered" (09/26/2015)  . Type II diabetes mellitus ([place])    Past Surgical History:  Procedure Laterality Date  . ELECTROPHYSIOLOGIC STUDY N/A 09/26/2015   Procedure: A-Flutter;  Surgeon: Deboraha Sprang, MD;  Location: [facility];  Service: Cardiovascular;  Laterality: N/A;  . TEE WITHOUT CARDIOVERSION N/A 08/13/2015   Procedure: TRANSESOPHAGEAL ECHOCARDIOGRAM (TEE) and cardioversion;  Surgeon: Minna Merritts, MD;  Location: [facility];  Service: Cardiovascular;  Laterality: N/A;   Family History  Problem Relation Age of Onset  . Heart attack Father     died in his 57's w/ cancer but dx CAD in his 47's.  . Cancer Father   . Other Mother     alive @ 24  . Heart attack Brother     died in early 58's.  . Diabetes Brother     Social History  Substance Use Topics  . Smoking status: Never Smoker  . Smokeless tobacco: Never Used  . Alcohol use No   No Known Allergies  Prior to Admission medications   Medication Sig Start Date End Date Taking? Authorizing Provider  carvedilol (COREG) 12.5 MG tablet Take 1 tablet (12.5 mg total) by mouth 2 (two) times daily with a meal. 10/17/15  Yes Jayce G Cook, DO  empagliflozin (JARDIANCE) 10 MG TABS tablet Take one tablet by mouth daily. 03/08/16  Yes Coral Spikes, DO  lisinopril-hydrochlorothiazide (PRINZIDE,ZESTORETIC) 20-12.5 MG tablet Take 1 tablet by mouth daily. 10/23/15  Yes Deboraha Sprang, MD  metFORMIN (GLUCOPHAGE) 500 MG tablet Pt will need an appt with labs before getting anymore refills.  Pt is to take 2 tablets (1,000 mg) by mouth twice a day. 03/08/16  Yes Coral Spikes, DO   Review of Systems  Constitutional: Negative for appetite change and fatigue.  HENT: Negative for congestion, rhinorrhea and sore throat.   Eyes: Negative.   Respiratory: Negative for cough, chest tightness and shortness of breath.   Cardiovascular: Negative for chest pain, palpitations and leg swelling.  Gastrointestinal: Negative for abdominal distention and abdominal pain.  Endocrine: Negative.   Genitourinary: Negative.   Musculoskeletal: Negative for back pain and neck pain.  Skin: Negative.  Allergic/Immunologic: Negative.   Neurological: Negative for dizziness and light-headedness.  Hematological: Negative for adenopathy. Does not bruise/bleed easily.  Psychiatric/Behavioral: Negative for dysphoric mood, sleep disturbance (sleeping on 2 pillows) and suicidal ideas. The patient is not nervous/anxious.    Vitals:   03/16/16 1027  BP: 138/69  Pulse: 64  Resp: 18  SpO2: 100%  Weight: 204 lb 2 oz (92.6 kg)  Height: 6' (1.829 m)   Wt Readings from Last 3 Encounters:  03/16/16 204 lb 2 oz (92.6 kg)  10/23/15 215 lb (97.5 kg)  09/27/15 209 lb (94.8 kg)   Lab Results   Component Value Date   CREATININE 1.11 11/06/2015   CREATININE 1.28 (H) 09/24/2015   CREATININE 1.25 (H) 08/15/2015   Physical Exam  Constitutional: He is oriented to person, place, and time. He appears well-developed and well-nourished.  HENT:  Head: Normocephalic and atraumatic.  Eyes: Conjunctivae are normal. Pupils are equal, round, and reactive to light.  Neck: Normal range of motion. Neck supple. No JVD present.  Cardiovascular: Normal rate and regular rhythm.   Pulmonary/Chest: Effort normal. He has no wheezes. He has no rales.  Abdominal: Soft. He exhibits no distension. There is no tenderness.  Musculoskeletal: He exhibits no edema or tenderness.  Neurological: He is alert and oriented to person, place, and time.  Skin: Skin is warm and dry.  Psychiatric: He has a normal mood and affect. His behavior is normal. Thought content normal.  Nursing note and vitals reviewed.  Assessment & Plan:  1: Chronic heart failure with preserved ejection fraction- - NYHA class I - euvolemic - continue weighing daily and call for an overnight weight gain of >2 pounds or a weekly weight gain of >5 pounds. Has lost 9 pounds since he was last here. - going to the gym 3 days a week and lifts weights and walks on the treadmill 20-30 minutes each time - not adding salt and is closely reading food labels  2: HTN- - BP looks good today - continue medications at this time  3: Atrial fibrillation- - had an ablation done August 2017 and has felt "great" ever since  Patient would like to return just if he needs to. Instructed him that he could call at anytime to return.

## 2016-03-16 NOTE — Patient Instructions (Signed)
Continue weighing daily and call for an overnight weight gain of > 2 pounds or a weekly weight gain of >5 pounds. 

## 2016-03-22 ENCOUNTER — Ambulatory Visit: Payer: Medicaid Other | Admitting: Family Medicine

## 2016-03-22 ENCOUNTER — Telehealth: Payer: Self-pay | Admitting: Family Medicine

## 2016-03-22 NOTE — Telephone Encounter (Signed)
FYI - Pt called and cancelled appt. His mom is really sick and taking her to the emergency room.

## 2016-03-22 NOTE — Telephone Encounter (Signed)
PCP aware

## 2016-04-12 ENCOUNTER — Ambulatory Visit (INDEPENDENT_AMBULATORY_CARE_PROVIDER_SITE_OTHER): Payer: BLUE CROSS/BLUE SHIELD | Admitting: Family Medicine

## 2016-04-12 VITALS — BP 143/88 | HR 56 | Temp 97.5°F | Wt 210.4 lb

## 2016-04-12 DIAGNOSIS — E785 Hyperlipidemia, unspecified: Secondary | ICD-10-CM

## 2016-04-12 DIAGNOSIS — N509 Disorder of male genital organs, unspecified: Secondary | ICD-10-CM | POA: Diagnosis not present

## 2016-04-12 DIAGNOSIS — I4892 Unspecified atrial flutter: Secondary | ICD-10-CM

## 2016-04-12 DIAGNOSIS — I10 Essential (primary) hypertension: Secondary | ICD-10-CM | POA: Diagnosis not present

## 2016-04-12 DIAGNOSIS — E119 Type 2 diabetes mellitus without complications: Secondary | ICD-10-CM | POA: Diagnosis not present

## 2016-04-12 DIAGNOSIS — I5022 Chronic systolic (congestive) heart failure: Secondary | ICD-10-CM | POA: Insufficient documentation

## 2016-04-12 DIAGNOSIS — N5089 Other specified disorders of the male genital organs: Secondary | ICD-10-CM

## 2016-04-12 DIAGNOSIS — I480 Paroxysmal atrial fibrillation: Secondary | ICD-10-CM

## 2016-04-12 DIAGNOSIS — I5030 Unspecified diastolic (congestive) heart failure: Secondary | ICD-10-CM

## 2016-04-12 DIAGNOSIS — Z130 Encounter for screening for diseases of the blood and blood-forming organs and certain disorders involving the immune mechanism: Secondary | ICD-10-CM

## 2016-04-12 LAB — COMPREHENSIVE METABOLIC PANEL
ALT: 16 U/L (ref 0–53)
AST: 14 U/L (ref 0–37)
Albumin: 4.3 g/dL (ref 3.5–5.2)
Alkaline Phosphatase: 63 U/L (ref 39–117)
BUN: 15 mg/dL (ref 6–23)
CO2: 25 meq/L (ref 19–32)
Calcium: 9.2 mg/dL (ref 8.4–10.5)
Chloride: 103 mEq/L (ref 96–112)
Creatinine, Ser: 1 mg/dL (ref 0.40–1.50)
GFR: 81.18 mL/min (ref >60.00)
GLUCOSE: 120 mg/dL — AB (ref 70–99)
POTASSIUM: 4.2 meq/L (ref 3.5–5.1)
SODIUM: 137 meq/L (ref 135–145)
Total Bilirubin: 0.4 mg/dL (ref 0.2–1.2)
Total Protein: 6.8 g/dL (ref 6.0–8.3)

## 2016-04-12 LAB — CBC
HCT: 48.4 % (ref 39.0–52.0)
Hemoglobin: 16.6 g/dL (ref 13.0–17.0)
MCHC: 34.3 g/dL (ref 30.0–36.0)
MCV: 96.2 fl (ref 78.0–100.0)
Platelets: 236 10*3/uL (ref 150.0–400.0)
RBC: 5.03 Mil/uL (ref 4.22–5.81)
RDW: 13 % (ref 11.5–15.5)
WBC: 10.2 10*3/uL (ref 4.0–10.5)

## 2016-04-12 LAB — LIPID PANEL
CHOL/HDL RATIO: 6
Cholesterol: 228 mg/dL — ABNORMAL HIGH (ref 0–200)
HDL: 36 mg/dL — ABNORMAL LOW (ref >39.00)
LDL CALC: 156 mg/dL — AB (ref 0–99)
NONHDL: 192.29
Triglycerides: 182 mg/dL — ABNORMAL HIGH (ref 0.0–149.0)
VLDL: 36.4 mg/dL (ref 0.0–40.0)

## 2016-04-12 LAB — HEMOGLOBIN A1C: HEMOGLOBIN A1C: 6.7 % — AB (ref 4.6–6.5)

## 2016-04-12 NOTE — Assessment & Plan Note (Signed)
A1C today. Well controlled per report. We'll continue Jardiance and metformin while awaiting A1c.

## 2016-04-12 NOTE — Progress Notes (Signed)
Pre visit review using our clinic review tool, if applicable. No additional management support is needed unless otherwise documented below in the visit note. 

## 2016-04-12 NOTE — Assessment & Plan Note (Signed)
Doing well s/p ablation. Continue Coreg.

## 2016-04-12 NOTE — Progress Notes (Signed)
Subjective:  Patient ID: Jeffrey Ortiz, male    DOB: December 25, 1955  Age: 61 y.o. MRN: [id number]  CC: Follow up   HPI:  61 year old male with hypertension, hyperlipidemia, DM 2, proximal A. fib/A flutter status post ablation, heart failure with preserved ejection fraction presents for follow-up.  DM-2  Blood sugars ranging from the 115 to 1:30.  He endorses compliance with Jardiance and metformin.  No current side effects.  Hypertension  BP stable on lisinopril, HCTZ, and Coreg.  Hyperlipidemia  Has been untreated. He did not receive treatment prior secondary to lack of insurance.  These lipid panel today. Will discuss treatment options.  Paroxysmal A. fib/A flutter, HFpEF  Doing well status post ablation.  His ejection fraction is now normal.  He endorses compliance with Coreg.  Testicular mass  Long-standing history.  This was examined previously and he elected to wait on further intervention given lack of insurance.  He now has insurance.  It is not currently bothering him.  Located on the left testicle.  He has had this for many years.  We'll discuss and reexamined today.  Social Hx   Social History   Social History  . Marital status: Married    Spouse name: N/A  . Number of children: N/A  . Years of education: N/A   Social History Main Topics  . Smoking status: Never Smoker  . Smokeless tobacco: Never Used  . Alcohol use No  . Drug use: No     Comment: Used marijuana a few times in college.  Marland Kitchen Sexual activity: Not Currently   Other Topics Concern  . Not on file   Social History Narrative   Lives locally with wife.  Does not routinely exercise.      Review of Systems  Respiratory: Negative.   Cardiovascular: Negative.   Genitourinary:       Testicular mass.   Objective:  BP (!) 143/88 (BP Location: Left Arm, Cuff Size: Large)   Pulse (!) 56   Temp 97.5 F (36.4 C) (Oral)   Wt 210 lb 6.4 oz (95.4 kg)   SpO2 98%   BMI 28.54  kg/m   BP/Weight 04/12/2016 [id number]  Systolic BP [id number]  Diastolic BP 88 69 82  Wt. (Lbs) 210.4 204.13 215  BMI 28.54 27.68 29.16   Physical Exam  Constitutional: He is oriented to person, place, and time. He appears well-developed. No distress.  Cardiovascular: Normal rate and regular rhythm.   Pulmonary/Chest: Effort normal and breath sounds normal. He has no wheezes. He has no rales.  Genitourinary:  Genitourinary Comments: Left testicle with a large, tender mass. No palpable hernia noted on exam.  Neurological: He is alert and oriented to person, place, and time.  Psychiatric: He has a normal mood and affect.  Vitals reviewed.  Lab Results  Component Value Date   WBC 9.3 09/24/2015   HGB 14.1 08/12/2015   HCT 47.5 09/24/2015   PLT 235 09/24/2015   GLUCOSE 123 (H) 11/06/2015   CHOL 152 08/12/2015   TRIG 100 08/12/2015   HDL 27 (L) 08/12/2015   LDLCALC 105 (H) 08/12/2015   NA 140 11/06/2015   K 4.4 11/06/2015   CL 103 11/06/2015   CREATININE 1.11 11/06/2015   BUN 20 11/06/2015   CO2 23 11/06/2015   TSH 0.979 08/11/2015   HGBA1C 7.8 (H) 09/15/2015    Assessment & Plan:   Problem List Items Addressed This Visit    Testicular mass  Referring to urology.      Relevant Orders   Ambulatory referral to Urology   Paroxysmal atrial fibrillation (HCC)   Relevant Medications   lisinopril (PRINIVIL,ZESTRIL) 20 MG tablet   hydrochlorothiazide (HYDRODIURIL) 25 MG tablet   Hyperlipidemia    Lipid panel today. Discussed starting statin. Will await lipid panel before starting.      Relevant Medications   lisinopril (PRINIVIL,ZESTRIL) 20 MG tablet   hydrochlorothiazide (HYDRODIURIL) 25 MG tablet   Other Relevant Orders   Lipid panel   Heart failure with preserved ejection fraction (HCC)    Euvolemic. Stable. Continue current medications.      Relevant Medications   lisinopril (PRINIVIL,ZESTRIL) 20 MG tablet   hydrochlorothiazide (HYDRODIURIL)  25 MG tablet   Essential hypertension (Chronic)    Stable. Continue Lisinopril, HCTZ, Coreg.      Relevant Medications   lisinopril (PRINIVIL,ZESTRIL) 20 MG tablet   hydrochlorothiazide (HYDRODIURIL) 25 MG tablet   Other Relevant Orders   Comprehensive metabolic panel   DM (diabetes mellitus), type 2 ([place]) - Primary    A1C today. Well controlled per report. We'll continue Jardiance and metformin while awaiting A1c.      Relevant Medications   lisinopril (PRINIVIL,ZESTRIL) 20 MG tablet   Other Relevant Orders   Hemoglobin A1c   Atrial flutter ([place])    Doing well s/p ablation. Continue Coreg.      Relevant Medications   lisinopril (PRINIVIL,ZESTRIL) 20 MG tablet   hydrochlorothiazide (HYDRODIURIL) 25 MG tablet    Other Visit Diagnoses    Screening for deficiency anemia       Relevant Orders   CBC     Meds ordered this encounter  Medications  . lisinopril (PRINIVIL,ZESTRIL) 20 MG tablet    Sig: Take 20 mg by mouth daily.  . hydrochlorothiazide (HYDRODIURIL) 25 MG tablet    Sig: Take 25 mg by mouth daily.    Follow-up: 3-6 months  [place] DO [facility]

## 2016-04-12 NOTE — Assessment & Plan Note (Signed)
Lipid panel today. Discussed starting statin. Will await lipid panel before starting.

## 2016-04-12 NOTE — Assessment & Plan Note (Signed)
Stable. Continue Lisinopril, HCTZ, Coreg.

## 2016-04-12 NOTE — Assessment & Plan Note (Signed)
Referring to urology.

## 2016-04-12 NOTE — Patient Instructions (Signed)
Continue your medications.  We will call with the referral.  Follow-up in 3-6 months.  Take care  Dr. Lacinda Axon

## 2016-04-12 NOTE — Assessment & Plan Note (Signed)
Euvolemic. Stable. Continue current medications.

## 2016-04-14 ENCOUNTER — Other Ambulatory Visit: Payer: Self-pay | Admitting: Family Medicine

## 2016-04-14 MED ORDER — EZETIMIBE 10 MG PO TABS: 10.0000 mg | ORAL_TABLET | Freq: Every day | ORAL | 3 refills | Status: DC

## 2016-04-14 NOTE — Progress Notes (Signed)
e

## 2016-04-14 NOTE — Addendum Note (Signed)
Addended by: Carmin Muskrat on: 04/14/2016 10:41 AM   Modules accepted: Orders

## 2016-04-19 ENCOUNTER — Telehealth: Payer: Self-pay | Admitting: *Deleted

## 2016-04-19 MED ORDER — HYDROCHLOROTHIAZIDE 25 MG PO TABS: 25.0000 mg | ORAL_TABLET | Freq: Every day | ORAL | 3 refills | Status: DC

## 2016-04-19 MED ORDER — LISINOPRIL 20 MG PO TABS: 20.0000 mg | ORAL_TABLET | Freq: Every day | ORAL | 3 refills | Status: DC

## 2016-04-19 NOTE — Telephone Encounter (Signed)
Pt will need a medication refill for lisinopril and hydrochlorothiazide  [place] Pt contact [phone]

## 2016-06-01 ENCOUNTER — Telehealth: Payer: Self-pay | Admitting: *Deleted

## 2016-06-01 MED ORDER — EMPAGLIFLOZIN 10 MG PO TABS: ORAL_TABLET | ORAL | 1 refills | Status: DC

## 2016-06-01 MED ORDER — METFORMIN HCL 500 MG PO TABS: ORAL_TABLET | ORAL | 1 refills | Status: DC

## 2016-06-01 NOTE — Telephone Encounter (Signed)
Patient requested a medication refill for metformin and [place]

## 2016-06-01 NOTE — Telephone Encounter (Signed)
Medication refilled and sent to pharmacy.

## 2016-06-11 ENCOUNTER — Telehealth: Payer: Self-pay | Admitting: Family Medicine

## 2016-06-11 MED ORDER — CARVEDILOL 12.5 MG PO TABS: 12.5000 mg | ORAL_TABLET | Freq: Two times a day (BID) | ORAL | 3 refills | Status: DC

## 2016-06-11 NOTE — Telephone Encounter (Signed)
Pt called about needing a refill for carvedilol (COREG) 12.5 MG tablet.   Pharmacy is [facility] [address], [place]  Call pt @ [phone]. Thank you!

## 2016-06-11 NOTE — Telephone Encounter (Signed)
refill sent to [facility]

## 2016-06-11 NOTE — Addendum Note (Signed)
Addended by: Carmin Muskrat on: 06/11/2016 10:51 AM   Modules accepted: Orders

## 2016-07-01 ENCOUNTER — Telehealth: Payer: Self-pay | Admitting: Family Medicine

## 2016-09-02 IMAGING — CT CT ANGIO CHEST
2 of 6 series · 18 of 46 positions shown · IV contrast (APPLIED)
Comparison: None.

CLINICAL DATA: Tachycardia and bilateral lower extremity swelling.

EXAM:
CT ANGIOGRAPHY CHEST WITH CONTRAST
TECHNIQUE: Multidetector CT imaging of the chest was performed using the
standard protocol during bolus administration of intravenous
contrast. Multiplanar CT image reconstructions and MIPs were
obtained to evaluate the vascular anatomy.
CONTRAST:  75 cc Isovue 370 intravenous

[Series 5: pe thins 1.5 · axial · 0.68mm/px · z∈[-763,-503]mm · 15 of 243 slices shown]
[im 13/243  lung]
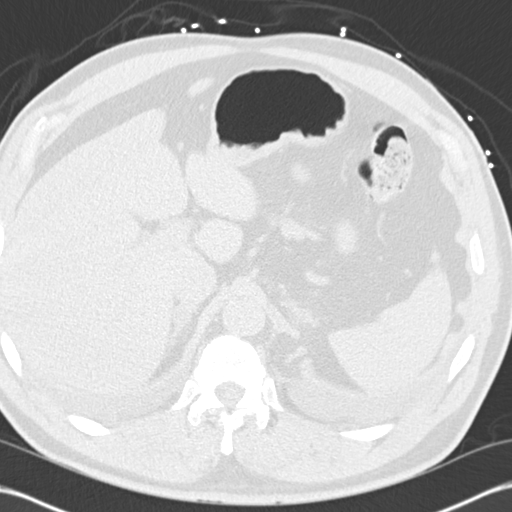
[im 26/243  soft-tissue]
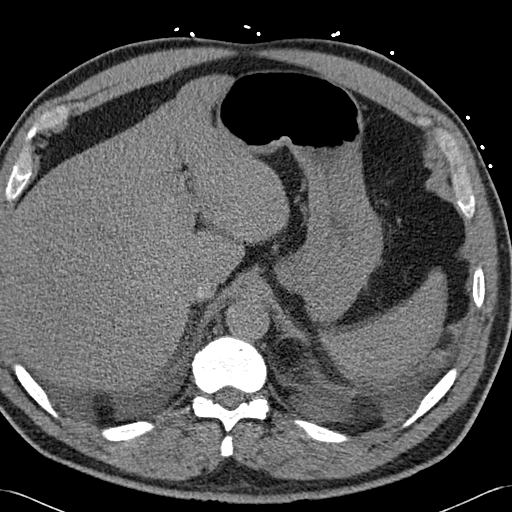
[im 51/243  lung]
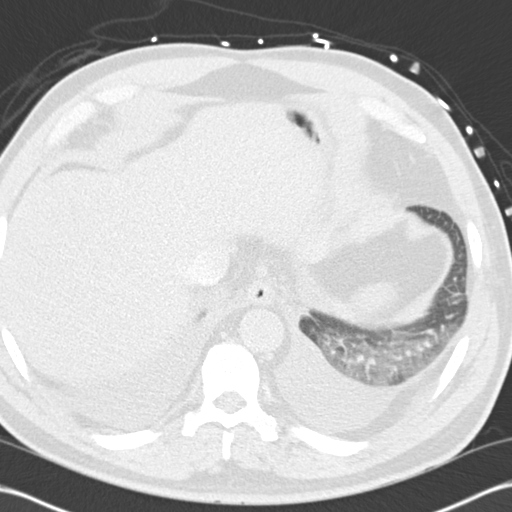
[im 64/243  soft-tissue]
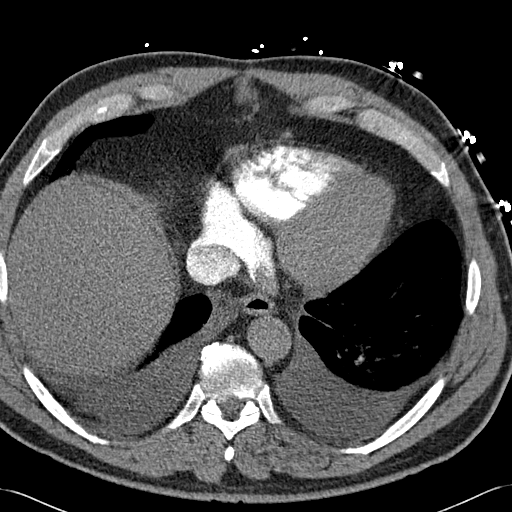
[im 77/243  lung]
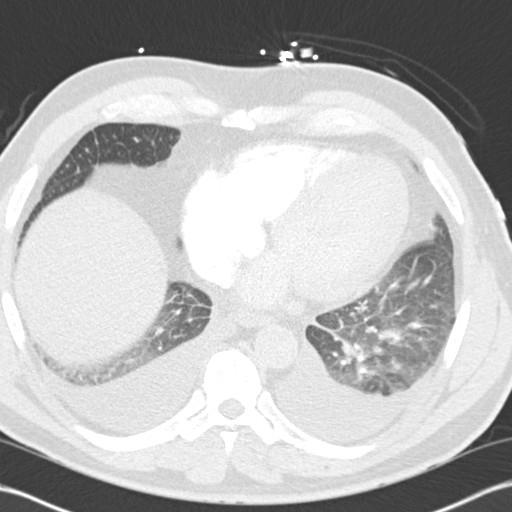
[im 90/243  soft-tissue]
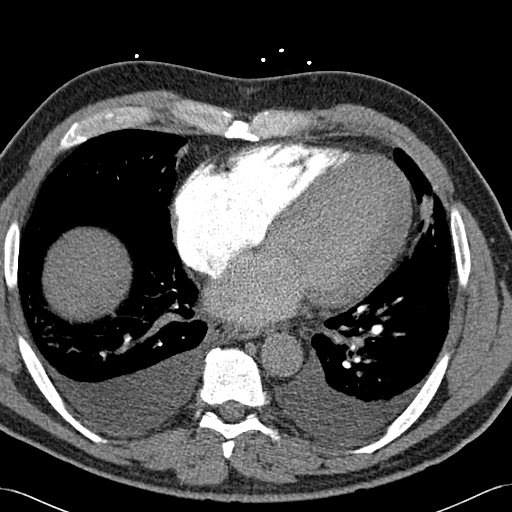
[im 102/243  lung]
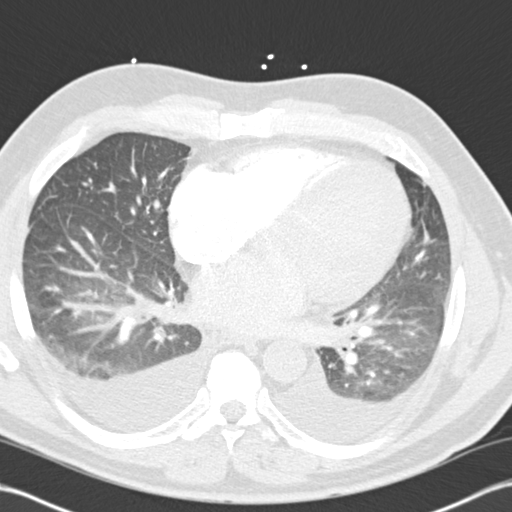
[im 128/243  soft-tissue]
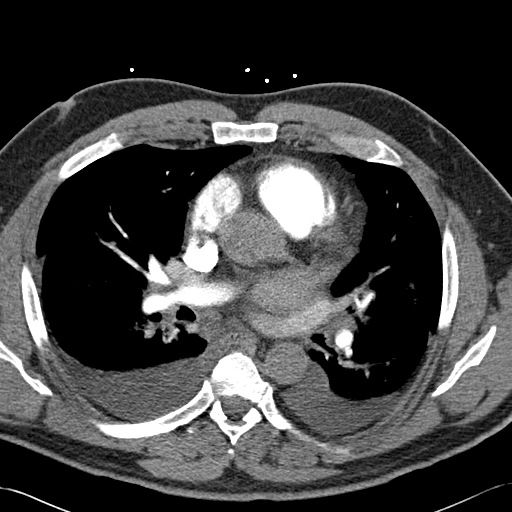
[im 141/243  lung]
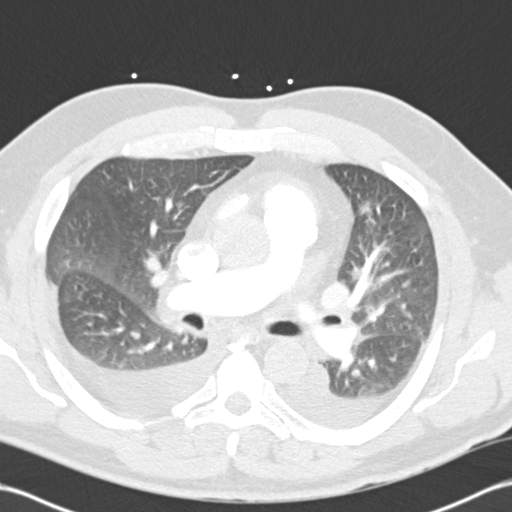
[im 153/243  soft-tissue]
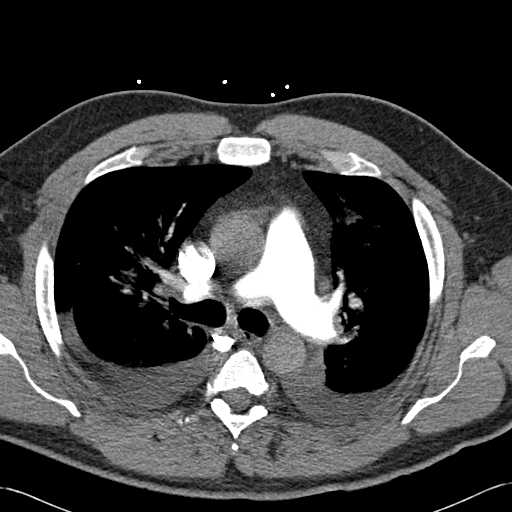
[im 166/243  lung]
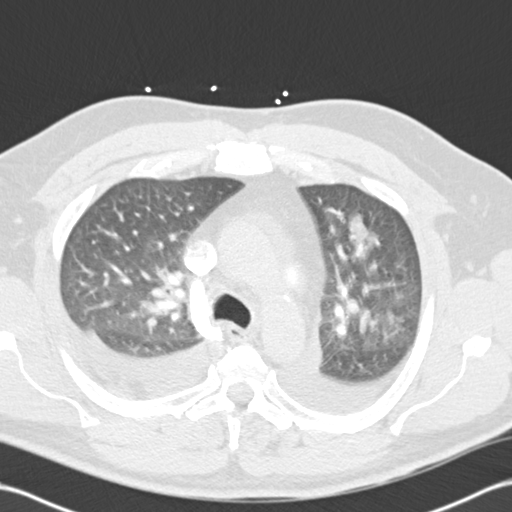
[im 179/243  soft-tissue]
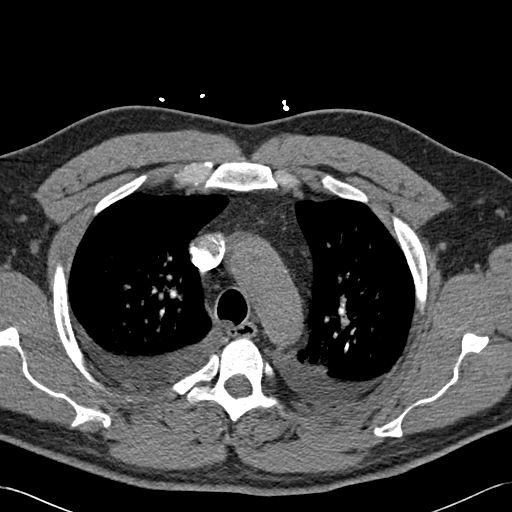
[im 204/243  lung]
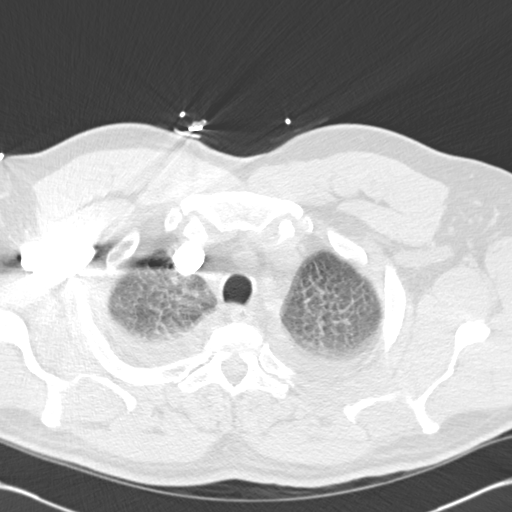
[im 217/243  soft-tissue]
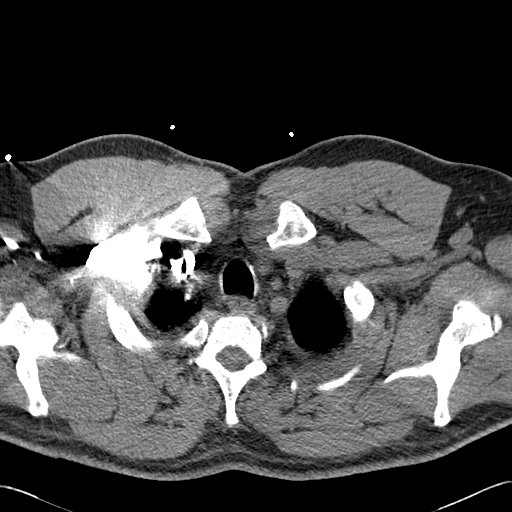
[im 230/243  lung]
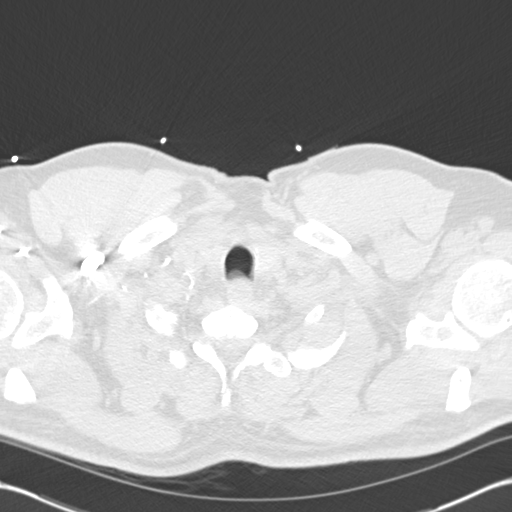

[Series 7: cor mpr 2.0 · coronal · 0.58mm/px · 3 of 140 slices shown]
[im 35/140  soft-tissue]
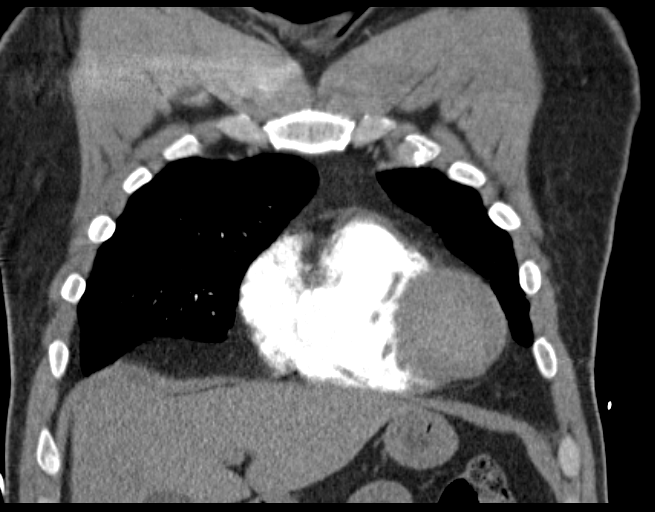
[im 70/140  soft-tissue]
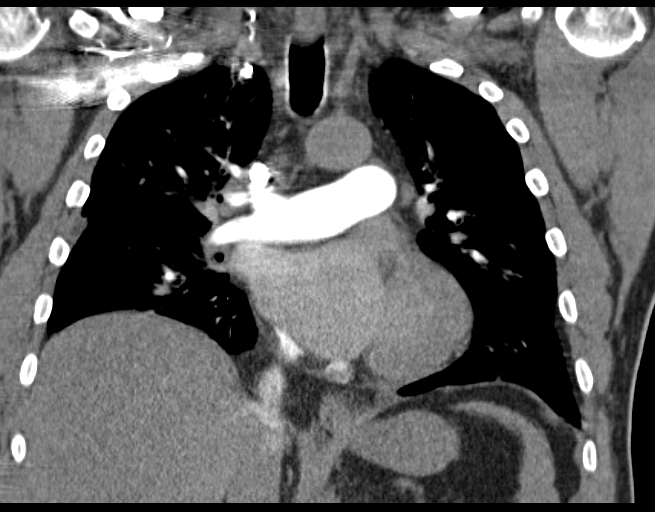
[im 105/140  soft-tissue]
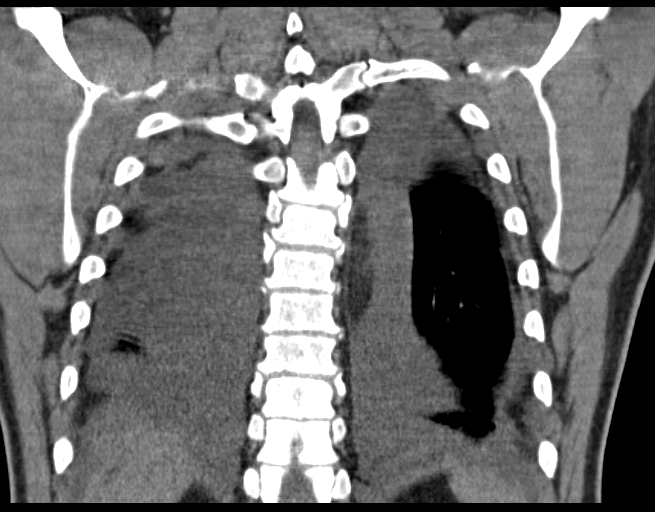

[18 of 46 positions shown; findings below may reference images not displayed]

FINDINGS: Mediastinum/Lymph Nodes: Mild cardiomegaly. No pericardial effusion.
Non-opacified aorta without acute finding. Motion degraded study,
best obtainable in this patient with shortness of breath. There is
no indication of pulmonary embolism. Small fatty Bochdalek's hernia
on the left.

Lungs/Pleura: Diffuse septal thickening with small borderline
moderate bilateral layering pleural effusion. Patchy ground-glass
opacities in the bilateral lungs is consistent with alveolar edema
in this clinical setting. Diffuse airway thickening is considered
congestive.

Upper abdomen: No acute findings.

Musculoskeletal: No chest wall mass or suspicious bone lesions
identified.

Review of the MIP images confirms the above findings.
IMPRESSION: 1. CHF.
2. Motion degraded CTA without evidence of pulmonary embolism.

## 2016-12-26 ENCOUNTER — Other Ambulatory Visit: Payer: Self-pay | Admitting: Family Medicine

## 2016-12-27 ENCOUNTER — Other Ambulatory Visit: Payer: Self-pay | Admitting: Internal Medicine

## 2016-12-27 ENCOUNTER — Telehealth: Payer: Self-pay | Admitting: Family

## 2016-12-27 NOTE — Telephone Encounter (Signed)
[place]      Pt  Already  Has  Refills  Left  On   His   hctz       Message  Left on patients   Voicemail

## 2016-12-27 NOTE — Telephone Encounter (Unsigned)
Copied from [place] #[id number]. >> Dec 27, 2016  4:35 PM Neva Seat wrote:  Needs order for Hydrochlorothiazide tablet 25mg   Rx #[id number]  [facility] [address] [facility] said they were sending request.  Please call pt to let him know it was sent to [facility]

## 2016-12-28 ENCOUNTER — Encounter: Payer: Self-pay | Admitting: *Deleted

## 2017-01-03 NOTE — Telephone Encounter (Signed)
Unread mychart message mailed to patient 

## 2017-01-25 ENCOUNTER — Ambulatory Visit: Payer: BLUE CROSS/BLUE SHIELD | Admitting: Internal Medicine

## 2017-04-04 ENCOUNTER — Other Ambulatory Visit: Payer: Self-pay | Admitting: Family Medicine

## 2017-04-06 NOTE — Telephone Encounter (Signed)
Okay to refill? Pt last seen in Feb 2018. Has appt on 04/13/17 with Arnett. Pt cancelled appt in December with Dr. Terese Door.

## 2017-04-13 ENCOUNTER — Ambulatory Visit: Payer: BLUE CROSS/BLUE SHIELD | Admitting: Family

## 2017-04-29 ENCOUNTER — Ambulatory Visit: Payer: BLUE CROSS/BLUE SHIELD | Admitting: Family

## 2017-05-09 ENCOUNTER — Other Ambulatory Visit: Payer: Self-pay | Admitting: Family

## 2017-05-09 NOTE — Telephone Encounter (Signed)
Copied from [place]. Topic: Quick Communication - Rx Refill/Question >> May 09, 2017  3:31 PM Boyd Kerbs wrote:  Pt. Has appt on 4/3 with Dr. Vidal Schwalbe but does not have enough medicine to last until then  Medication:  lisinopril (PRINIVIL,ZESTRIL) 20 MG tablet metFORMIN (GLUCOPHAGE) 500 MG tablet hydrochlorothiazide (HYDRODIURIL) 25 MG tablet  Has the patient contacted their pharmacy? No. (Agent: If no, request that the patient contact the pharmacy for the refill.) Preferred Pharmacy (with phone number or street name):   [facility] [address] [place] [address] Phone: [phone] Fax: [phone]   Agent: Please be advised that RX refills may take up to 3 business days. We ask that you follow-up with your pharmacy.

## 2017-05-10 NOTE — Telephone Encounter (Signed)
Pt requesting refills for : Lisinopril with historical provider, and  hydrochlorothiazide with historical provider .  Refill for Metformin. Last refilled was 12/28/16 for 360 tablets. Last HA1C was 6.7 on 04/12/16  LOV 04/12/16 NOV  05/18/17  Pharmacy: [facility] [address]  Please review.

## 2017-05-11 NOTE — Telephone Encounter (Signed)
Please refill meds he needs, one month supply with 1 refill  Emphasize to pt that he must be seen on 4/3 for his appt

## 2017-05-13 ENCOUNTER — Telehealth: Payer: Self-pay | Admitting: Family

## 2017-05-13 ENCOUNTER — Telehealth: Payer: Self-pay

## 2017-05-13 MED ORDER — METFORMIN HCL 500 MG PO TABS: ORAL_TABLET | ORAL | 1 refills | Status: DC

## 2017-05-13 MED ORDER — HYDROCHLOROTHIAZIDE 25 MG PO TABS: 25.0000 mg | ORAL_TABLET | Freq: Every day | ORAL | 1 refills | Status: DC

## 2017-05-13 MED ORDER — LISINOPRIL 20 MG PO TABS: 20.0000 mg | ORAL_TABLET | Freq: Every day | ORAL | 1 refills | Status: DC

## 2017-05-13 NOTE — Telephone Encounter (Signed)
Patient advised of below and verbalized understanding.   Sent in scripts as directed for lisinopril , metformin , hctz.  Requesting other script as order, coreg ,zetia and jardiance.   Patient states he was former Dr Lacinda Axon patient and job has been holding him back from coming in for appointment, again told patient would need to come in for appointment.

## 2017-05-13 NOTE — Telephone Encounter (Signed)
Called and notified patient that refill have been sent to pharmacy. Per Dr. Vidal Schwalbe 30 day supply with 1 refill.

## 2017-05-13 NOTE — Telephone Encounter (Signed)
Medications have been refilled per Dr. Vidal Schwalbe and patient has been notified.

## 2017-05-13 NOTE — Telephone Encounter (Signed)
Pt calling and states that he is almost out of his meds and the pharmacy has still not received the medications. Please call pt.

## 2017-05-13 NOTE — Telephone Encounter (Signed)
Copied from [place]. Topic: Quick Communication - Rx Refill/Question >> May 09, 2017  3:31 PM Boyd Kerbs wrote:  Pt. Has appt on 4/3 with Dr. Vidal Schwalbe but does not have enough medicine to last until then  Medication:  lisinopril (PRINIVIL,ZESTRIL) 20 MG tablet metFORMIN (GLUCOPHAGE) 500 MG tablet hydrochlorothiazide (HYDRODIURIL) 25 MG tablet  Has the patient contacted their pharmacy? No. (Agent: If no, request that the patient contact the pharmacy for the refill.) Preferred Pharmacy (with phone number or street name):   [facility] [address] [place] - [place] [address] Phone: [phone] Fax: [phone]   Agent: Please be advised that RX refills may take up to 3 business days. We ask that you follow-up with your pharmacy. >> May 09, 2017  3:37 PM Boyd Kerbs wrote: Pt. Is ok until Wed. - he will be out then on his prescriptions >> May 13, 2017  3:11 PM Cleaster Corin, [place] wrote: Pt. Calling back stating that med. Was sent to the wrong pharmacy. Med. Refill was supposed to be sent [facility] [address].  [place], [place] - [place]  [place] [address]  Phone: [phone] Fax: [phone]    lisinopril (PRINIVIL,ZESTRIL) 20 MG tablet lisinopril (PRINIVIL,ZESTRIL) 20 MG tablet metFORMIN (GLUCOPHAGE) 500 MG tablet metFORMIN (GLUCOPHAGE) 500 MG tablet hydrochlorothiazide (HYDRODIURIL) 25 MG tablet

## 2017-05-14 ENCOUNTER — Other Ambulatory Visit: Payer: Self-pay | Admitting: Family Medicine

## 2017-05-16 ENCOUNTER — Telehealth: Payer: Self-pay | Admitting: Family Medicine

## 2017-05-16 NOTE — Telephone Encounter (Signed)
Copied from [place]. Topic: Quick Communication - Rx Refill/Question >> May 16, 2017 10:00 AM Scherrie Gerlach wrote: Medication: ezetimibe (ZETIA) 10 MG tablet Has the patient contacted their pharmacy? Yes Pt has appt for Friday but is out of his [facility] [place], [place] - [place] [phone] (Phone) [phone] (Fax)  Can you call in a 30 day to get him through?

## 2017-05-16 NOTE — Telephone Encounter (Signed)
I refilled for courtesy on 05/13/17  He has appt with me on 4/5, I can refill then.   Does he not have enough?  May give him a weeks worth as afraid he may not come in due to cancelations.   I cannot prescribe medications if I have not met him and does not keep f/u appts.

## 2017-05-16 NOTE — Telephone Encounter (Signed)
Can patient get refill on Zetia

## 2017-05-16 NOTE — Telephone Encounter (Signed)
Req. Zetia refill Last office visit 04/12/16 PCP : was Thersa Salt; has appt. to get established with M. Arnett on 05/20/17 Pharmacy: [facility] on [address] in [place]  Medication has been pended previously.

## 2017-05-16 NOTE — Telephone Encounter (Signed)
Okay to refill? See appt history below. Scheduled to see Arnett on 05/20/17.  04/29/2017 3:00 PM Can [name], FNP [facility] OV Transfer care from Dr. Lupe Carney refills lm on vm with appt reminder  04/13/2017 3:00 PM Can [name], FNP [facility] OV Transfer care from Dr. Lupe Carney refills  01/25/2017 4:00 PM Can McLean-Scocuzza, Nino Glow, MD [facility] med refills   04/12/2016 9:15 AM Comp Coral Spikes, DO [facility] [id number] follow up - am pt confirmed appt 02/23

## 2017-05-17 NOTE — Telephone Encounter (Signed)
See Arnett comment from 05/16/17 @ 1:19pm

## 2017-05-18 ENCOUNTER — Ambulatory Visit: Payer: BLUE CROSS/BLUE SHIELD | Admitting: Family

## 2017-05-18 MED ORDER — EZETIMIBE 10 MG PO TABS: 10.0000 mg | ORAL_TABLET | Freq: Every day | ORAL | 3 refills | Status: DC

## 2017-05-18 MED ORDER — EMPAGLIFLOZIN 10 MG PO TABS: 10.0000 mg | ORAL_TABLET | Freq: Every day | ORAL | 0 refills | Status: DC

## 2017-05-18 NOTE — Telephone Encounter (Signed)
Please give ONE More month supply of all meds. This is my LAST refill   If patient no shows for next appointment, I am sorry but I cannot continue to refill without an OV. Please ensure he understands and document in your note.

## 2017-05-18 NOTE — Telephone Encounter (Signed)
Per Cyril Mourning, patient has all medications  Will disregard this request

## 2017-05-18 NOTE — Telephone Encounter (Signed)
See refill request , medication refilled , patient aware

## 2017-05-18 NOTE — Telephone Encounter (Signed)
rx sent.  Advised patient scripts must keep appointment or medications would not be refilled again.

## 2017-05-20 ENCOUNTER — Ambulatory Visit (INDEPENDENT_AMBULATORY_CARE_PROVIDER_SITE_OTHER): Payer: BLUE CROSS/BLUE SHIELD | Admitting: Family

## 2017-05-20 ENCOUNTER — Encounter: Payer: Self-pay | Admitting: Family

## 2017-05-20 VITALS — BP 150/90 | HR 59 | Temp 98.2°F | Wt 204.1 lb

## 2017-05-20 DIAGNOSIS — I5030 Unspecified diastolic (congestive) heart failure: Secondary | ICD-10-CM

## 2017-05-20 DIAGNOSIS — Z23 Encounter for immunization: Secondary | ICD-10-CM | POA: Diagnosis not present

## 2017-05-20 DIAGNOSIS — E119 Type 2 diabetes mellitus without complications: Secondary | ICD-10-CM

## 2017-05-20 DIAGNOSIS — G4730 Sleep apnea, unspecified: Secondary | ICD-10-CM

## 2017-05-20 DIAGNOSIS — I10 Essential (primary) hypertension: Secondary | ICD-10-CM | POA: Diagnosis not present

## 2017-05-20 MED ORDER — HYDROCHLOROTHIAZIDE 25 MG PO TABS: 25.0000 mg | ORAL_TABLET | Freq: Every day | ORAL | 1 refills | Status: DC

## 2017-05-20 MED ORDER — METFORMIN HCL 500 MG PO TABS
ORAL_TABLET | ORAL | 3 refills | Status: DC
Start: 2017-05-20 — End: 2017-11-08

## 2017-05-20 MED ORDER — EMPAGLIFLOZIN 10 MG PO TABS: 10.0000 mg | ORAL_TABLET | Freq: Every day | ORAL | 1 refills | Status: DC

## 2017-05-20 MED ORDER — LISINOPRIL 20 MG PO TABS: 20.0000 mg | ORAL_TABLET | Freq: Every day | ORAL | 1 refills | Status: DC

## 2017-05-20 MED ORDER — EZETIMIBE 10 MG PO TABS: 10.0000 mg | ORAL_TABLET | Freq: Every day | ORAL | 1 refills | Status: DC

## 2017-05-20 MED ORDER — CARVEDILOL 12.5 MG PO TABS: 12.5000 mg | ORAL_TABLET | Freq: Two times a day (BID) | ORAL | 3 refills | Status: DC

## 2017-05-20 NOTE — Assessment & Plan Note (Signed)
No signs today of fluid volume overload.   Will follow

## 2017-05-20 NOTE — Assessment & Plan Note (Signed)
Pending A1c. Will follow. 

## 2017-05-20 NOTE — Patient Instructions (Addendum)
Monitor blood pressure,  Goal is less than 130/80; if persistently higher, please make sooner follow up appointment so we can recheck you blood pressure and manage medications.   Tetanus vaccine at local pharmacy.  Fasting  Lab appointment next week or following  Today we discussed referrals, orders. COLONOSCOPY   I have placed these orders in the system for you.  Please be sure to give [place] a call if you have not heard from our office regarding scheduling a test or regarding referral in a timely manner.  It is very important that you let me know as soon as possible.

## 2017-05-20 NOTE — Progress Notes (Signed)
Subjective:    Patient ID: Jeffrey Ortiz, male    DOB: [birth year], 62 y.o.   MRN: [id number]  CC: Jeffrey Ortiz is a 62 y.o. male who presents today for follow up.   HPI: HTN- hasn't taken medications yet today. Doesn't check blood pressure at home.   DM- FBG around 110s. Metformin 500mg  BID.   HF- No leg swelling, changes in weight, orthopnea. Denies exertional chest pain or pressure, numbness or tingling radiating to left arm or jaw, palpitations, dizziness, frequent headaches, changes in vision, or shortness of breath.   OSA- not wearing cipap. Declines changing mask, re evaluation.      Last cardiology appointment 02/2016- h/o CHF, atrial flutter s/p ablation august 2017 2017 echo EF 55-60%   HISTORY:  Past Medical History:  Diagnosis Date  . Atrial flutter ([place])    a. 8.2017 s/p RFCA.  Marland Kitchen Chronic systolic CHF (congestive heart failure) ([place])    a. 09/2015 Echo: EF 40-45%, diff HK.  . Essential hypertension   . Hyperlipidemia   . Hypertension   . Overweight   . Sleep apnea    "did study 2 days ago; CPAP ordered" (09/26/2015)  . Type II diabetes mellitus ([place])    Past Surgical History:  Procedure Laterality Date  . ELECTROPHYSIOLOGIC STUDY N/A 09/26/2015   Procedure: A-Flutter;  Surgeon: Deboraha Sprang, MD;  Location: [facility];  Service: Cardiovascular;  Laterality: N/A;  . TEE WITHOUT CARDIOVERSION N/A 08/13/2015   Procedure: TRANSESOPHAGEAL ECHOCARDIOGRAM (TEE) and cardioversion;  Surgeon: Minna Merritts, MD;  Location: [facility];  Service: Cardiovascular;  Laterality: N/A;   Family History  Problem Relation Age of Onset  . Heart attack Father        died in his 36's w/ cancer but dx CAD in his 64's.  . Cancer Father   . Other Mother        alive @ 5  . Heart attack Brother        died in early 79's.  . Diabetes Brother     Allergies: Patient has no known allergies. Current Outpatient Medications on File Prior to Visit  Medication Sig Dispense  Refill  . carvedilol (COREG) 12.5 MG tablet Take 1 tablet (12.5 mg total) by mouth 2 (two) times daily with a meal. 180 tablet 3  . empagliflozin (JARDIANCE) 10 MG TABS tablet Take 10 mg by mouth daily. 7 tablet 0  . ezetimibe (ZETIA) 10 MG tablet Take 1 tablet (10 mg total) by mouth daily. 7 tablet 3  . hydrochlorothiazide (HYDRODIURIL) 25 MG tablet Take 1 tablet (25 mg total) by mouth daily. 30 tablet 1  . lisinopril (PRINIVIL,ZESTRIL) 20 MG tablet Take 1 tablet (20 mg total) by mouth daily. 30 tablet 1  . metFORMIN (GLUCOPHAGE) 500 MG tablet TAKE 2 TABLETS BY MOUTH TWICE DAILY 60 tablet 1   No current facility-administered medications on file prior to visit.     Social History   Tobacco Use  . Smoking status: Never Smoker  . Smokeless tobacco: Never Used  Substance Use Topics  . Alcohol use: No    Alcohol/week: 0.0 oz  . Drug use: No    Comment: Used marijuana a few times in college.    Review of Systems  Constitutional: Negative for chills and fever.  Respiratory: Negative for cough, shortness of breath and wheezing.   Cardiovascular: Negative for chest pain, palpitations and leg swelling.  Gastrointestinal: Negative for diarrhea, nausea and vomiting.  Genitourinary: Negative for  dysuria.  Musculoskeletal: Negative for myalgias.  Skin: Negative for rash.  Neurological: Negative for headaches.      Objective:    BP (!) 150/90   Pulse (!) 59   Temp 98.2 F (36.8 C) (Oral)   Wt 204 lb 2 oz (92.6 kg)   SpO2 96%   BMI 27.68 kg/m  BP Readings from Last 3 Encounters:  05/20/17 (!) 150/90  04/12/16 (!) 143/88  03/16/16 138/69   Wt Readings from Last 3 Encounters:  05/20/17 204 lb 2 oz (92.6 kg)  04/12/16 210 lb 6.4 oz (95.4 kg)  03/16/16 204 lb 2 oz (92.6 kg)    Physical Exam  Constitutional: He appears well-developed and well-nourished.  Cardiovascular: Regular rhythm and normal heart sounds.  Pulmonary/Chest: Effort normal and breath sounds normal. No  respiratory distress. He has no wheezes. He has no rhonchi. He has no rales.  Lymphadenopathy:       Head (left side): No submandibular and no preauricular adenopathy present.  Neurological: He is alert.  Skin: Skin is warm and dry.  Psychiatric: He has a normal mood and affect. His speech is normal and behavior is normal.  Vitals reviewed.      Assessment & Plan:   Problem List Items Addressed This Visit      Cardiovascular and Mediastinum   Essential hypertension - Primary (Chronic)    Slightly elevated above goal however patient has not taken medications today.  He will monitor at home and let me know so we can titrate from there.      Relevant Orders   CBC with Differential/Platelet   Comprehensive metabolic panel   Hemoglobin A1c   Lipid panel   TSH   VITAMIN D 25 Hydroxy (Vit-D Deficiency, Fractures)   Microalbumin / creatinine urine ratio   Ambulatory referral to Gastroenterology   PSA   HIV antibody   Heart failure with preserved ejection fraction (HCC)    No signs today of fluid volume overload.   Will follow        Respiratory   Sleep apnea    Currently not wearing CPAP machine.  Offered to see if we can find a new mask which may be more comfortable for him. Patient politely declines at this time.  Discussed cardiovascular risk stroke, heart attack of untreated sleep apnea.          Endocrine   DM (diabetes mellitus), type 2 ([place])    Pending A1c.  Will follow       Other Visit Diagnoses    Need for pneumococcal vaccination       Relevant Orders   Pneumococcal polysaccharide vaccine 23-valent greater than or equal to 2yo subcutaneous/IM       I am having Jeffrey Ortiz maintain his carvedilol, lisinopril, hydrochlorothiazide, metFORMIN, ezetimibe, and empagliflozin.   No orders of the defined types were placed in this encounter.   Return precautions given.   Risks, benefits, and alternatives of the medications and treatment plan prescribed  today were discussed, and patient expressed understanding.   Education regarding symptom management and diagnosis given to patient on AVS.  Continue to follow with [name], FNP for routine health maintenance.   Jeffrey Ortiz and I agreed with plan.   [name], FNP

## 2017-05-20 NOTE — Assessment & Plan Note (Addendum)
Currently not wearing CPAP machine.  Offered to see if we can find a new mask which may be more comfortable for him. Patient politely declines at this time.  Discussed cardiovascular risk stroke, heart attack of untreated sleep apnea.

## 2017-05-20 NOTE — Assessment & Plan Note (Signed)
Slightly elevated above goal however patient has not taken medications today.  He will monitor at home and let me know so we can titrate from there.

## 2017-06-20 ENCOUNTER — Encounter: Payer: Self-pay | Admitting: *Deleted

## 2017-06-29 ENCOUNTER — Telehealth: Payer: Self-pay

## 2017-06-29 NOTE — Telephone Encounter (Signed)
Returned call back to patient to schedule colonoscopy-LVM.

## 2017-07-27 ENCOUNTER — Encounter: Payer: Self-pay | Admitting: Family

## 2017-09-12 ENCOUNTER — Other Ambulatory Visit (INDEPENDENT_AMBULATORY_CARE_PROVIDER_SITE_OTHER): Payer: BLUE CROSS/BLUE SHIELD

## 2017-09-12 DIAGNOSIS — I10 Essential (primary) hypertension: Secondary | ICD-10-CM

## 2017-09-12 LAB — COMPREHENSIVE METABOLIC PANEL
ALBUMIN: 3.9 g/dL (ref 3.5–5.2)
ALT: 18 U/L (ref 0–53)
AST: 18 U/L (ref 0–37)
Alkaline Phosphatase: 61 U/L (ref 39–117)
BILIRUBIN TOTAL: 0.4 mg/dL (ref 0.2–1.2)
BUN: 15 mg/dL (ref 6–23)
CHLORIDE: 108 meq/L (ref 96–112)
CO2: 21 mEq/L (ref 19–32)
CREATININE: 1.02 mg/dL (ref 0.40–1.50)
Calcium: 8.7 mg/dL (ref 8.4–10.5)
GFR: 78.97 mL/min (ref >60.00)
Glucose, Bld: 132 mg/dL — ABNORMAL HIGH (ref 70–99)
Potassium: 3.9 mEq/L (ref 3.5–5.1)
SODIUM: 140 meq/L (ref 135–145)
TOTAL PROTEIN: 6.3 g/dL (ref 6.0–8.3)

## 2017-09-12 LAB — CBC WITH DIFFERENTIAL/PLATELET
BASOS PCT: 0.4 % (ref 0.0–3.0)
Basophils Absolute: 0 10*3/uL (ref 0.0–0.1)
EOS ABS: 0.2 10*3/uL (ref 0.0–0.7)
Eosinophils Relative: 2.7 % (ref 0.0–5.0)
HCT: 44.5 % (ref 39.0–52.0)
HEMOGLOBIN: 15.2 g/dL (ref 13.0–17.0)
Lymphocytes Relative: 21.1 % (ref 12.0–46.0)
Lymphs Abs: 1.2 10*3/uL (ref 0.7–4.0)
MCHC: 34 g/dL (ref 30.0–36.0)
MCV: 96.1 fl (ref 78.0–100.0)
MONO ABS: 0.5 10*3/uL (ref 0.1–1.0)
Monocytes Relative: 9.2 % (ref 3.0–12.0)
Neutro Abs: 3.9 10*3/uL (ref 1.4–7.7)
Neutrophils Relative %: 66.6 % (ref 43.0–77.0)
PLATELETS: 204 10*3/uL (ref 150.0–400.0)
RBC: 4.64 Mil/uL (ref 4.22–5.81)
RDW: 12.8 % (ref 11.5–15.5)
WBC: 5.9 10*3/uL (ref 4.0–10.5)

## 2017-09-12 LAB — TSH: TSH: 0.64 u[IU]/mL (ref 0.35–4.50)

## 2017-09-12 LAB — MICROALBUMIN / CREATININE URINE RATIO
Creatinine,U: 221.9 mg/dL
MICROALB/CREAT RATIO: 0.6 mg/g (ref 0.0–30.0)
Microalb, Ur: 1.3 mg/dL (ref 0.0–1.9)

## 2017-09-12 LAB — VITAMIN D 25 HYDROXY (VIT D DEFICIENCY, FRACTURES): VITD: 16.68 ng/mL — AB (ref 30.00–100.00)

## 2017-09-12 LAB — LIPID PANEL
CHOLESTEROL: 140 mg/dL (ref 0–200)
HDL: 38.5 mg/dL — ABNORMAL LOW (ref >39.00)
LDL Cholesterol: 85 mg/dL (ref 0–99)
NonHDL: 101.44
Total CHOL/HDL Ratio: 4
Triglycerides: 80 mg/dL (ref 0.0–149.0)
VLDL: 16 mg/dL (ref 0.0–40.0)

## 2017-09-12 LAB — HEMOGLOBIN A1C: Hgb A1c MFr Bld: 7 % — ABNORMAL HIGH (ref 4.6–6.5)

## 2017-09-13 LAB — HIV ANTIBODY (ROUTINE TESTING W REFLEX): HIV: NONREACTIVE

## 2017-10-28 ENCOUNTER — Telehealth: Payer: Self-pay | Admitting: Family

## 2017-10-28 DIAGNOSIS — E785 Hyperlipidemia, unspecified: Secondary | ICD-10-CM

## 2017-10-28 NOTE — Telephone Encounter (Signed)
Call patient I was reviewing his chart and realized that I never heard back in July regarding starting a statin medication such as Crestor for his elevated cardiovascular risk. Is he okay with crestor? Who started the zetia as we could potentially take him off of that as crestor is first line and more potent  Please also ensure he has f/u appt with for DM- due for a1c 12/13/17

## 2017-11-03 NOTE — Telephone Encounter (Signed)
Spoke with patient he is will to try crestor . However he just refilled his zetia for 90 day supply. He wants to finish this 1st , then start . Appointment scheduled in November for follow up.

## 2017-11-08 ENCOUNTER — Telehealth: Payer: Self-pay | Admitting: Family

## 2017-11-08 ENCOUNTER — Other Ambulatory Visit: Payer: Self-pay | Admitting: Family

## 2017-11-08 DIAGNOSIS — E119 Type 2 diabetes mellitus without complications: Secondary | ICD-10-CM

## 2017-11-08 DIAGNOSIS — I10 Essential (primary) hypertension: Secondary | ICD-10-CM

## 2017-11-08 MED ORDER — EZETIMIBE 10 MG PO TABS: 10.0000 mg | ORAL_TABLET | Freq: Every day | ORAL | 0 refills | Status: DC

## 2017-11-08 MED ORDER — LISINOPRIL 20 MG PO TABS: 20.0000 mg | ORAL_TABLET | Freq: Every day | ORAL | 0 refills | Status: DC

## 2017-11-08 NOTE — Telephone Encounter (Signed)
Copied from [place] [id number]. Topic: Quick Communication - Rx Refill/Question >> Nov 08, 2017 10:17 AM Celedonio Savage L wrote: Medication: metFORMIN (GLUCOPHAGE) 500 MG tablet  carvedilol (COREG) 12.5 MG tablet empagliflozin (JARDIANCE) 10 MG TABS tablet  ezetimibe (ZETIA) 10 MG hydrochlorothiazide (HYDRODIURIL) 25 MG tablet lisinopril (PRINIVIL,ZESTRIL) 20 MG tablet  Pt has an appt in November    Has the patient contacted their pharmacy? Yes.   (Agent: If no, request that the patient contact the pharmacy for the refill.) (Agent: If yes, when and what did the pharmacy advise?)  Preferred Pharmacy (with phone number or street name):     [facility] [address] [place] - [place] [phone] (Phone) [phone] (Fax)    Agent: Please be advised that RX refills may take up to 3 business days. We ask that you follow-up with your pharmacy.

## 2017-11-08 NOTE — Telephone Encounter (Signed)
rx request 

## 2017-11-08 NOTE — Telephone Encounter (Signed)
Spoke to [place] patient will need refill on Lisinopril and Zetia. I will send refills in

## 2017-11-08 NOTE — Telephone Encounter (Signed)
Refill request for medications:  Lisinopril  20 mg LR 05/20/17  For 90 and 1 refill    Hydrochlorothiazide 25 mg  LR 05/20/17  For 90 and 1 refill  Ezetimibe 10 mg  LR  05/20/17  For 90 and 1 refill Last lipids 09/12/17  Empagliflozin  10 mg   LR  05/30/17  For 90 and 1 refill   Metformin 500 mg  LR 05/20/17  For 120 and 3 refills (takes 2 bid) Last HA1C was 09/12/17  Carvedilol 12.5 mg  LR 05/20/17  For 180 and 3 refills (takes 1 bid) Should not need refilling until April 2020  PCP  [name], NP LOV  05/20/17 NOV  01/04/18 Pharmacy  [facility] [phone]

## 2017-11-08 NOTE — Telephone Encounter (Addendum)
Scripts sent in for lisinopril and zetia, patient advised

## 2017-11-09 MED ORDER — ROSUVASTATIN CALCIUM 10 MG PO TABS: 10.0000 mg | ORAL_TABLET | Freq: Every day | ORAL | 3 refills | Status: DC

## 2017-11-09 NOTE — Telephone Encounter (Signed)
Call pt He may finish zetia , then stop. Can you d/c the zetia from his list? ( for some reason I cannot)  He may start crestor which is a statin medication to lower his overall CVD risk  Likely crestor will be enough however some folks due require zetia in addition too. We will decide that later on

## 2017-11-09 NOTE — Addendum Note (Signed)
Addended by: Johna Sheriff on: 11/09/2017 03:44 PM   Modules accepted: Orders

## 2017-11-09 NOTE — Telephone Encounter (Signed)
Spoke with patient and advised of below and he will pick up Crestor and begin medication once he finishes Zetia.

## 2018-01-04 ENCOUNTER — Ambulatory Visit: Payer: BLUE CROSS/BLUE SHIELD | Admitting: Family

## 2018-01-11 ENCOUNTER — Ambulatory Visit: Payer: BLUE CROSS/BLUE SHIELD | Admitting: Family

## 2018-01-25 ENCOUNTER — Ambulatory Visit: Payer: BLUE CROSS/BLUE SHIELD | Admitting: Family

## 2018-07-05 ENCOUNTER — Telehealth: Payer: Self-pay | Admitting: Family

## 2018-07-05 ENCOUNTER — Other Ambulatory Visit: Payer: Self-pay

## 2018-07-05 DIAGNOSIS — E119 Type 2 diabetes mellitus without complications: Secondary | ICD-10-CM

## 2018-07-05 DIAGNOSIS — I10 Essential (primary) hypertension: Secondary | ICD-10-CM

## 2018-07-05 DIAGNOSIS — E785 Hyperlipidemia, unspecified: Secondary | ICD-10-CM

## 2018-07-05 MED ORDER — EMPAGLIFLOZIN 10 MG PO TABS: 10.0000 mg | ORAL_TABLET | Freq: Every day | ORAL | 1 refills | Status: DC

## 2018-07-05 MED ORDER — METFORMIN HCL 500 MG PO TABS: 1000.0000 mg | ORAL_TABLET | Freq: Two times a day (BID) | ORAL | 1 refills | Status: DC

## 2018-07-05 MED ORDER — HYDROCHLOROTHIAZIDE 25 MG PO TABS: 25.0000 mg | ORAL_TABLET | Freq: Every day | ORAL | 1 refills | Status: DC

## 2018-07-05 MED ORDER — EZETIMIBE 10 MG PO TABS: 10.0000 mg | ORAL_TABLET | Freq: Every day | ORAL | 0 refills | Status: DC

## 2018-07-05 MED ORDER — ROSUVASTATIN CALCIUM 10 MG PO TABS: 10.0000 mg | ORAL_TABLET | Freq: Every day | ORAL | 3 refills | Status: DC

## 2018-07-05 NOTE — Telephone Encounter (Signed)
Copied from [place] [id number]. Topic: Quick Communication - Rx Refill/Question >> Jul 05, 2018 11:51 AM [name] A wrote: Medication: empagliflozin (JARDIANCE) 10 MG TABS tablet, ezetimibe (ZETIA) 10 MG tablet, hydrochlorothiazide (HYDRODIURIL) 25 MG tablet, metFORMIN (GLUCOPHAGE) 500 MG tablet , rosuvastatin (CRESTOR) 10 MG tablet   Has the patient contacted their pharmacy? No. (Agent: If no, request that the patient contact the pharmacy for the refill.) (Agent: If yes, when and what did the pharmacy advise?)  Preferred Pharmacy (with phone number or street name): [facility], [place] - [address] [place] [address] Phone: [phone] Fax: [phone] Not a 24 hour pharmacy; exact hours not known.    Agent: Please be advised that RX refills may take up to 3 business days. We ask that you follow-up with your pharmacy.

## 2018-07-05 NOTE — Telephone Encounter (Signed)
I have spoken with patient & refilled requested prescriptions. He requested refill of carvedilol, but this is not on current med list. I see that he had not been seen since April 2019, so I scheduled f/u. I see no reason why carvedilol was d/c.

## 2018-07-05 NOTE — Telephone Encounter (Signed)
Copied from [place] [id number]. Topic: General - Other >> Jul 05, 2018  2:30 PM Keene Breath wrote: Reason for CRM: Patient called to request that the doctor or nurse give him a call regarding his current medications.  Patient feels that one of his medications was changed and he did not know about it.  Please advise and call patient back at [phone]

## 2018-07-07 ENCOUNTER — Ambulatory Visit: Payer: BLUE CROSS/BLUE SHIELD | Admitting: Family

## 2018-07-14 ENCOUNTER — Encounter: Payer: Self-pay | Admitting: Family

## 2018-07-14 ENCOUNTER — Other Ambulatory Visit: Payer: Self-pay

## 2018-07-14 ENCOUNTER — Ambulatory Visit (INDEPENDENT_AMBULATORY_CARE_PROVIDER_SITE_OTHER): Payer: BLUE CROSS/BLUE SHIELD | Admitting: Family

## 2018-07-14 DIAGNOSIS — E785 Hyperlipidemia, unspecified: Secondary | ICD-10-CM | POA: Diagnosis not present

## 2018-07-14 DIAGNOSIS — I10 Essential (primary) hypertension: Secondary | ICD-10-CM | POA: Diagnosis not present

## 2018-07-14 DIAGNOSIS — R3911 Hesitancy of micturition: Secondary | ICD-10-CM | POA: Diagnosis not present

## 2018-07-14 DIAGNOSIS — I4892 Unspecified atrial flutter: Secondary | ICD-10-CM

## 2018-07-14 DIAGNOSIS — E119 Type 2 diabetes mellitus without complications: Secondary | ICD-10-CM | POA: Diagnosis not present

## 2018-07-14 DIAGNOSIS — Z1211 Encounter for screening for malignant neoplasm of colon: Secondary | ICD-10-CM

## 2018-07-14 NOTE — Assessment & Plan Note (Signed)
ordered

## 2018-07-14 NOTE — Assessment & Plan Note (Signed)
Appears resolved after ablation. Symptomatic.  history of heart failure, though also appears recovered.   At this point based on his heart rate (58), advised patient to stay off the carvedilol until we consult cardiology.  He will send me blood pressure, heart rate readings as well.

## 2018-07-14 NOTE — Assessment & Plan Note (Signed)
Pending PSA and urology evaluation.  Will follow

## 2018-07-14 NOTE — Patient Instructions (Signed)
As discussed, please send blood pressure and heart rate readings from home.  This will help make a sound decisikn in regards to carvedilol.  Please also call to schedule your fasting lab appointment.  Today we discussed referrals, orders.  Cardiology, gastroenterology ( colonoscopy), and neurology   I have placed these orders in the system for you.  Please be sure to give [place] a call if you have not heard from our office regarding this. We should hear from [place] within ONE week with information regarding your appointment. If not, please let me know immediately.    Nice to see you!

## 2018-07-14 NOTE — Assessment & Plan Note (Signed)
Pending lipid panel 

## 2018-07-14 NOTE — Progress Notes (Signed)
This visit type was conducted due to national recommendations for restrictions regarding the COVID-19 pandemic (e.g. social distancing).  This format is felt to be most appropriate for this patient at this time.  All issues noted in this document were discussed and addressed.  No physical exam was performed (except for noted visual exam findings with Video Visits).  Virtual Visit via Video Note  I connected with@  on 07/14/18 at  4:00 PM EDT by a video enabled telemedicine application and verified that I am speaking with the correct person using two identifiers.  Location patient: home Location provider:work Persons participating in the virtual visit: patient, provider  I discussed the limitations of evaluation and management by telemedicine and the availability of in person appointments. The patient expressed understanding and agreed to proceed.   HPI:    HTN - not on carvedilol for couple of weeks , not sure he is supposed to be on medication. Last bp 145/70. Wears fit bit, and HR 58.   Denies exertional chest pain or pressure, numbness or tingling radiating to left arm or jaw, palpitations, dizziness, frequent headaches, changes in vision, leg swelling, or shortness of breath.    Afib and A flutter- had ablation with Dr Caryl Comes. No afib last ekg 08/2015. Last appt 10/2015.   DM- on metformin, jardiance  HLD- on zetia, crestor  Noted urine stream is slower than other times. Better recently. Has had times where needed to stand over commode for several minutes. No blood, dysuria, bowel movement pain, testicular.    Echo 2017- EF 55-60%  Saw Darylene Price 02/2016  ROS: See pertinent positives and negatives per HPI.  Past Medical History:  Diagnosis Date  . Atrial flutter ([place])    a. 8.2017 s/p RFCA.  Marland Kitchen Chronic systolic CHF (congestive heart failure) ([place])    a. 09/2015 Echo: EF 40-45%, diff HK.  . Essential hypertension   . Hyperlipidemia   . Hypertension   . Overweight   .  Sleep apnea    "did study 2 days ago; CPAP ordered" (09/26/2015)  . Type II diabetes mellitus ([place])     Past Surgical History:  Procedure Laterality Date  . ELECTROPHYSIOLOGIC STUDY N/A 09/26/2015   Procedure: A-Flutter;  Surgeon: Deboraha Sprang, MD;  Location: [facility];  Service: Cardiovascular;  Laterality: N/A;  . TEE WITHOUT CARDIOVERSION N/A 08/13/2015   Procedure: TRANSESOPHAGEAL ECHOCARDIOGRAM (TEE) and cardioversion;  Surgeon: Minna Merritts, MD;  Location: [facility];  Service: Cardiovascular;  Laterality: N/A;    Family History  Problem Relation Age of Onset  . Heart attack Father        died in his 2's w/ cancer but dx CAD in his 84's.  . Cancer Father   . Other Mother        alive @ 30  . Heart attack Brother        died in early 9's.  . Diabetes Brother     SOCIAL HX: non smoker   Current Outpatient Medications:  .  empagliflozin (JARDIANCE) 10 MG TABS tablet, Take 10 mg by mouth daily., Disp: 90 tablet, Rfl: 1 .  ezetimibe (ZETIA) 10 MG tablet, Take 1 tablet (10 mg total) by mouth daily., Disp: 90 tablet, Rfl: 0 .  hydrochlorothiazide (HYDRODIURIL) 25 MG tablet, Take 1 tablet (25 mg total) by mouth daily., Disp: 90 tablet, Rfl: 1 .  lisinopril (PRINIVIL,ZESTRIL) 20 MG tablet, Take 1 tablet (20 mg total) by mouth daily., Disp: 90 tablet, Rfl: 0 .  metFORMIN (GLUCOPHAGE) 500 MG tablet, Take 2 tablets (1,000 mg total) by mouth 2 (two) times daily., Disp: 360 tablet, Rfl: 1 .  rosuvastatin (CRESTOR) 10 MG tablet, Take 1 tablet (10 mg total) by mouth daily., Disp: 90 tablet, Rfl: 3  EXAM:  VITALS per patient if applicable:  GENERAL: alert, oriented, appears well and in no acute distress  HEENT: atraumatic, conjunttiva clear, no obvious abnormalities on inspection of external nose and ears  NECK: normal movements of the head and neck  LUNGS: on inspection no signs of respiratory distress, breathing rate appears normal, no obvious gross SOB, gasping or  wheezing  CV: no obvious cyanosis  MS: moves all visible extremities without noticeable abnormality  PSYCH/NEURO: pleasant and cooperative, no obvious depression or anxiety, speech and thought processing grossly intact  ASSESSMENT AND PLAN:  Discussed the following assessment and plan:  Essential hypertension - Plan: Ambulatory referral to Cardiology  Hyperlipidemia, unspecified hyperlipidemia type  Type 2 diabetes mellitus without complication, without long-term current use of insulin (HCC) - Plan: CBC with Differential/Platelet, Comprehensive metabolic panel, Hemoglobin A1c, Lipid panel, TSH, VITAMIN D 25 Hydroxy (Vit-D Deficiency, Fractures)  Urinary hesitancy - Plan: PSA, Ambulatory referral to Urology  Screen for colon cancer - Plan: Ambulatory referral to Gastroenterology  Atrial flutter, unspecified type ([place])  Problem List Items Addressed This Visit      Cardiovascular and Mediastinum   Essential hypertension - Primary (Chronic)    Slightly elevated.  Heart rate 58.  Advised patient to send me a log of blood pressure readings and heart rate so we can make changes as needed.       Relevant Orders   Ambulatory referral to Cardiology   Atrial flutter ([place])    Appears resolved after ablation. Symptomatic.  history of heart failure, though also appears recovered.   At this point based on his heart rate (58), advised patient to stay off the carvedilol until we consult cardiology.  He will send me blood pressure, heart rate readings as well.         Endocrine   DM (diabetes mellitus), type 2 ([place])    Pending a1c      Relevant Orders   CBC with Differential/Platelet   Comprehensive metabolic panel   Hemoglobin A1c   Lipid panel   TSH   VITAMIN D 25 Hydroxy (Vit-D Deficiency, Fractures)     Other   Hyperlipidemia    Pending lipid panel      Urinary hesitancy    Pending PSA and urology evaluation.  Will follow      Relevant Orders   PSA   Ambulatory  referral to Urology   Screen for colon cancer    ordered      Relevant Orders   Ambulatory referral to Gastroenterology        I discussed the assessment and treatment plan with the patient. The patient was provided an opportunity to ask questions and all were answered. The patient agreed with the plan and demonstrated an understanding of the instructions.   The patient was advised to call back or seek an in-person evaluation if the symptoms worsen or if the condition fails to improve as anticipated.   [name], FNP

## 2018-07-14 NOTE — Assessment & Plan Note (Signed)
Slightly elevated.  Heart rate 58.  Advised patient to send me a log of blood pressure readings and heart rate so we can make changes as needed.

## 2018-07-14 NOTE — Assessment & Plan Note (Signed)
Pending a1c. 

## 2018-07-20 ENCOUNTER — Other Ambulatory Visit: Payer: Self-pay

## 2018-07-20 ENCOUNTER — Telehealth: Payer: Self-pay | Admitting: Gastroenterology

## 2018-07-20 ENCOUNTER — Telehealth: Payer: Self-pay

## 2018-07-20 DIAGNOSIS — Z1211 Encounter for screening for malignant neoplasm of colon: Secondary | ICD-10-CM

## 2018-07-20 MED ORDER — NA SULFATE-K SULFATE-MG SULF 17.5-3.13-1.6 GM/177ML PO SOLN: 1.0000 | Freq: Once | ORAL | 0 refills | Status: AC

## 2018-07-20 NOTE — Telephone Encounter (Signed)
Gastroenterology Pre-Procedure Review  Request Date: 08/10/18 Requesting Physician: Dr. Bonna Gains  PATIENT REVIEW QUESTIONS: The patient responded to the following health history questions as indicated:    1. Are you having any GI issues? no 2. Do you have a personal history of Polyps? no 3. Do you have a family history of Colon Cancer or Polyps? yes (sister polyps) 4. Diabetes Mellitus? yes (type 2) 5. Joint replacements in the past 12 months?no 6. Major health problems in the past 3 months?no 7. Any artificial heart valves, MVP, or defibrillator?AFIB    MEDICATIONS & ALLERGIES:    Patient reports the following regarding taking any anticoagulation/antiplatelet therapy:   Plavix, Coumadin, Eliquis, Xarelto, Lovenox, Pradaxa, Brilinta, or Effient? no Aspirin? no  Patient confirms/reports the following medications:  Current Outpatient Medications  Medication Sig Dispense Refill  . empagliflozin (JARDIANCE) 10 MG TABS tablet Take 10 mg by mouth daily. 90 tablet 1  . ezetimibe (ZETIA) 10 MG tablet Take 1 tablet (10 mg total) by mouth daily. 90 tablet 0  . hydrochlorothiazide (HYDRODIURIL) 25 MG tablet Take 1 tablet (25 mg total) by mouth daily. 90 tablet 1  . lisinopril (PRINIVIL,ZESTRIL) 20 MG tablet Take 1 tablet (20 mg total) by mouth daily. 90 tablet 0  . metFORMIN (GLUCOPHAGE) 500 MG tablet Take 2 tablets (1,000 mg total) by mouth 2 (two) times daily. 360 tablet 1  . rosuvastatin (CRESTOR) 10 MG tablet Take 1 tablet (10 mg total) by mouth daily. 90 tablet 3   No current facility-administered medications for this visit.     Patient confirms/reports the following allergies:  No Known Allergies  No orders of the defined types were placed in this encounter.   AUTHORIZATION INFORMATION Primary Insurance: 1D#: Group #:  Secondary Insurance: 1D#: Group #:  SCHEDULE INFORMATION: Date: 08/10/18 Time: Location:[facility]

## 2018-07-20 NOTE — Telephone Encounter (Signed)
Patient called & l/m on v/m for Sharyn Lull to call him back to r/s the colonoscopy scheduled for 08-10-2018 with Dr T.

## 2018-07-24 NOTE — Telephone Encounter (Signed)
Returned patients call.  He wants me to call him back tomorrow morning to change his procedure date.  Thanks [employer]

## 2018-07-26 ENCOUNTER — Telehealth: Payer: Self-pay | Admitting: Cardiovascular Disease

## 2018-07-26 NOTE — Telephone Encounter (Signed)
Pending

## 2018-07-31 ENCOUNTER — Telehealth: Payer: Self-pay | Admitting: Gastroenterology

## 2018-07-31 NOTE — Telephone Encounter (Signed)
Pt left vm he has a procedure 08/10/18 and needs to reschedule please call pt

## 2018-08-01 NOTE — Telephone Encounter (Signed)
Returned patients call to reschedule his colonoscopy.  He said that he does not need to reschedule now and can keep his colonoscopy as scheduled.  Reminded patient to have COVID test on 08/07/18.  Thanks [employer]

## 2018-08-07 ENCOUNTER — Other Ambulatory Visit
Admission: RE | Admit: 2018-08-07 | Discharge: 2018-08-07 | Disposition: A | Discharge status: Home / Self Care | Payer: BLUE CROSS/BLUE SHIELD | Source: Ambulatory Visit | Attending: Gastroenterology | Admitting: Gastroenterology

## 2018-08-07 DIAGNOSIS — Z1159 Encounter for screening for other viral diseases: Secondary | ICD-10-CM | POA: Insufficient documentation

## 2018-08-08 LAB — NOVEL CORONAVIRUS, NAA (HOSP ORDER, SEND-OUT TO REF LAB; TAT 18-24 HRS): SARS-CoV-2, NAA: NOT DETECTED

## 2018-08-09 ENCOUNTER — Encounter: Payer: Self-pay | Admitting: *Deleted

## 2018-08-10 ENCOUNTER — Encounter
Admission: RE | Disposition: A | Discharge status: Other Acute Inpatient Hospital | Payer: Self-pay | Source: Home / Self Care | Attending: Gastroenterology

## 2018-08-10 ENCOUNTER — Encounter: Payer: Self-pay | Admitting: *Deleted

## 2018-08-10 ENCOUNTER — Ambulatory Visit
Admission: RE | Admit: 2018-08-10 | Discharge: 2018-08-10 | Disposition: A | Discharge status: Home / Self Care | Payer: BLUE CROSS/BLUE SHIELD | Attending: Gastroenterology | Admitting: Gastroenterology

## 2018-08-10 ENCOUNTER — Emergency Department
Admission: EM | Admit: 2018-08-10 | Discharge: 2018-08-10 | Disposition: A | Discharge status: Home / Self Care | Payer: BLUE CROSS/BLUE SHIELD | Source: Home / Self Care

## 2018-08-10 ENCOUNTER — Other Ambulatory Visit: Payer: Self-pay

## 2018-08-10 ENCOUNTER — Ambulatory Visit: Payer: BLUE CROSS/BLUE SHIELD | Admitting: Anesthesiology

## 2018-08-10 DIAGNOSIS — G4730 Sleep apnea, unspecified: Secondary | ICD-10-CM | POA: Insufficient documentation

## 2018-08-10 DIAGNOSIS — E119 Type 2 diabetes mellitus without complications: Secondary | ICD-10-CM | POA: Insufficient documentation

## 2018-08-10 DIAGNOSIS — Z79899 Other long term (current) drug therapy: Secondary | ICD-10-CM | POA: Diagnosis not present

## 2018-08-10 DIAGNOSIS — Z5309 Procedure and treatment not carried out because of other contraindication: Secondary | ICD-10-CM | POA: Insufficient documentation

## 2018-08-10 DIAGNOSIS — Z1211 Encounter for screening for malignant neoplasm of colon: Secondary | ICD-10-CM | POA: Insufficient documentation

## 2018-08-10 DIAGNOSIS — I4892 Unspecified atrial flutter: Secondary | ICD-10-CM | POA: Insufficient documentation

## 2018-08-10 DIAGNOSIS — I5022 Chronic systolic (congestive) heart failure: Secondary | ICD-10-CM | POA: Insufficient documentation

## 2018-08-10 DIAGNOSIS — I110 Hypertensive heart disease with heart failure: Secondary | ICD-10-CM | POA: Diagnosis not present

## 2018-08-10 DIAGNOSIS — I4891 Unspecified atrial fibrillation: Secondary | ICD-10-CM

## 2018-08-10 DIAGNOSIS — Z6826 Body mass index (BMI) 26.0-26.9, adult: Secondary | ICD-10-CM | POA: Insufficient documentation

## 2018-08-10 DIAGNOSIS — E785 Hyperlipidemia, unspecified: Secondary | ICD-10-CM | POA: Diagnosis not present

## 2018-08-10 DIAGNOSIS — Z7984 Long term (current) use of oral hypoglycemic drugs: Secondary | ICD-10-CM | POA: Insufficient documentation

## 2018-08-10 DIAGNOSIS — E663 Overweight: Secondary | ICD-10-CM | POA: Diagnosis not present

## 2018-08-10 HISTORY — PX: COLONOSCOPY WITH PROPOFOL: SHX5780

## 2018-08-10 LAB — CBC WITH DIFFERENTIAL/PLATELET
Abs Immature Granulocytes: 0.02 10*3/uL (ref 0.00–0.07)
Basophils Absolute: 0.1 10*3/uL (ref 0.0–0.1)
Basophils Relative: 1 %
Eosinophils Absolute: 0.1 10*3/uL (ref 0.0–0.5)
Eosinophils Relative: 2 %
HCT: 49.9 % (ref 39.0–52.0)
Hemoglobin: 17.2 g/dL — ABNORMAL HIGH (ref 13.0–17.0)
Immature Granulocytes: 0 %
Lymphocytes Relative: 23 %
Lymphs Abs: 2 10*3/uL (ref 0.7–4.0)
MCH: 32.3 pg (ref 26.0–34.0)
MCHC: 34.5 g/dL (ref 30.0–36.0)
MCV: 93.8 fL (ref 80.0–100.0)
Monocytes Absolute: 0.7 10*3/uL (ref 0.1–1.0)
Monocytes Relative: 8 %
Neutro Abs: 5.8 10*3/uL (ref 1.7–7.7)
Neutrophils Relative %: 66 %
Platelets: 262 10*3/uL (ref 150–400)
RBC: 5.32 MIL/uL (ref 4.22–5.81)
RDW: 12.1 % (ref 11.5–15.5)
WBC: 8.6 10*3/uL (ref 4.0–10.5)
nRBC: 0 % (ref 0.0–0.2)

## 2018-08-10 LAB — BASIC METABOLIC PANEL
Anion gap: 10 (ref 5–15)
BUN: 10 mg/dL (ref 8–23)
CO2: 20 mmol/L — ABNORMAL LOW (ref 22–32)
Calcium: 8.5 mg/dL — ABNORMAL LOW (ref 8.9–10.3)
Chloride: 110 mmol/L (ref 98–111)
Creatinine, Ser: 0.88 mg/dL (ref 0.61–1.24)
GFR calc Af Amer: >60 mL/min (ref >60)
GFR calc non Af Amer: >60 mL/min (ref >60)
Glucose, Bld: 122 mg/dL — ABNORMAL HIGH (ref 70–99)
Potassium: 3.6 mmol/L (ref 3.5–5.1)
Sodium: 140 mmol/L (ref 135–145)

## 2018-08-10 LAB — GLUCOSE, CAPILLARY: Glucose-Capillary: 105 mg/dL — ABNORMAL HIGH (ref 70–99)

## 2018-08-10 SURGERY — COLONOSCOPY WITH PROPOFOL
Anesthesia: General

## 2018-08-10 MED ORDER — LABETALOL HCL 5 MG/ML IV SOLN
INTRAVENOUS | Status: DC | PRN
  Administered 2018-08-10: 10 mg via INTRAVENOUS

## 2018-08-10 MED ORDER — METOPROLOL TARTRATE 25 MG PO TABS
12.5000 mg | ORAL_TABLET | Freq: Once | ORAL | Status: AC
  Administered 2018-08-10: 12.5 mg via ORAL
  Filled 2018-08-10: qty 1

## 2018-08-10 MED ORDER — METOPROLOL TARTRATE 5 MG/5ML IV SOLN
INTRAVENOUS | Status: DC | PRN
  Administered 2018-08-10 (×2): 5 mg via INTRAVENOUS

## 2018-08-10 MED ORDER — METOPROLOL TARTRATE 25 MG PO TABS: 12.5000 mg | ORAL_TABLET | Freq: Two times a day (BID) | ORAL | 0 refills | Status: DC

## 2018-08-10 MED ORDER — SODIUM CHLORIDE 0.9 % IV SOLN
INTRAVENOUS | Status: DC
  Administered 2018-08-10 (×2): via INTRAVENOUS

## 2018-08-10 MED ORDER — ASPIRIN EC 81 MG PO TBEC
81.0000 mg | DELAYED_RELEASE_TABLET | Freq: Once | ORAL | Status: AC
  Administered 2018-08-10: 81 mg via ORAL
  Filled 2018-08-10: qty 1

## 2018-08-10 NOTE — Discharge Instructions (Addendum)
Return to the emergency department immediately if you develop any chest pain, chest pressure, or shortness of breath.  Take an 81mg  Aspirin daily.  Take the metoprolol 2 times per day.  If you begin to feel overly tired, please call the cardiologist office.  If your heart rate stays elevated above 130, please take an extra tablet of metoprolol.

## 2018-08-10 NOTE — ED Provider Notes (Signed)
[facility] Emergency Department Provider Note  ____________________________________________   None    (approximate)  I have reviewed the triage vital signs and the nursing notes.   HISTORY  Chief Complaint Atrial Flutter   HPI Jeffrey Ortiz is a 63 y.o. male who presents to the emergency department for treatment and evaluation of onset of atrial fibrillation/atrial flutter prior to having his colonoscopy.  He has a history of atrial fibrillation/atrial flutter but had AN ablation in 2017 and has since not had any issues.  He is not currently on any anticoagulants or medication for rate control.  He is asymptomatic.  He has no chest pain, shortness of breath, or dizziness.  Anesthesia had not administered any medications before rhythm change.     Past Medical History:  Diagnosis Date  . Atrial flutter ([place])    a. 8.2017 s/p RFCA.  Marland Kitchen Chronic systolic CHF (congestive heart failure) ([place])    a. 09/2015 Echo: EF 40-45%, diff HK.  . Essential hypertension   . Hyperlipidemia   . Hypertension   . Overweight   . Sleep apnea    "did study 2 days ago; CPAP ordered" (09/26/2015)  . Type II diabetes mellitus [facility])     Patient Active Problem List   Diagnosis Date Noted  . Urinary hesitancy 07/14/2018  . Screen for colon cancer 07/14/2018  . Heart failure with preserved ejection fraction ([place]) 04/12/2016  . Atrial flutter ([place]) 09/15/2015  . Hyperlipidemia 09/15/2015  . Testicular mass 09/15/2015  . Essential hypertension 08/25/2015  . DM (diabetes mellitus), type 2 ([place]) 08/25/2015  . Sleep apnea 08/22/2015  . Paroxysmal atrial fibrillation ([facility])   . Obesity   . Esophageal reflux     Past Surgical History:  Procedure Laterality Date  . COLONOSCOPY WITH PROPOFOL N/A 08/10/2018   Procedure: COLONOSCOPY WITH PROPOFOL;  Surgeon: Virgel Manifold, MD;  Location: [facility] ENDOSCOPY;  Service: Endoscopy;  Laterality: N/A;  . ELECTROPHYSIOLOGIC STUDY N/A  09/26/2015   Procedure: A-Flutter;  Surgeon: Deboraha Sprang, MD;  Location: [place] CV LAB;  Service: Cardiovascular;  Laterality: N/A;  . TEE WITHOUT CARDIOVERSION N/A 08/13/2015   Procedure: TRANSESOPHAGEAL ECHOCARDIOGRAM (TEE) and cardioversion;  Surgeon: Minna Merritts, MD;  Location: [facility] ORS;  Service: Cardiovascular;  Laterality: N/A;    Prior to Admission medications   Medication Sig Start Date End Date Taking? Authorizing Provider  empagliflozin (JARDIANCE) 10 MG TABS tablet Take 10 mg by mouth daily. 07/05/18   [name], FNP  ezetimibe (ZETIA) 10 MG tablet Take 1 tablet (10 mg total) by mouth daily. 07/05/18   [name], FNP  hydrochlorothiazide (HYDRODIURIL) 25 MG tablet Take 1 tablet (25 mg total) by mouth daily. 07/05/18   [name], FNP  lisinopril (PRINIVIL,ZESTRIL) 20 MG tablet Take 1 tablet (20 mg total) by mouth daily. 11/08/17   [name], FNP  metFORMIN (GLUCOPHAGE) 500 MG tablet Take 2 tablets (1,000 mg total) by mouth 2 (two) times daily. 07/05/18   [name], FNP  metoprolol tartrate (LOPRESSOR) 25 MG tablet Take 0.5 tablets (12.5 mg total) by mouth 2 (two) times daily. 08/10/18   Briahna Pescador, Johnette Abraham B, FNP  rosuvastatin (CRESTOR) 10 MG tablet Take 1 tablet (10 mg total) by mouth daily. 07/05/18   [name], FNP    Allergies Patient has no known allergies.  Family History  Problem Relation Age of Onset  . Heart attack Father  died in his 49's w/ cancer but dx CAD in his 33's.  . Cancer Father   . Other Mother        alive @ 72  . Heart attack Brother        died in early 36's.  . Diabetes Brother     Social History Social History   Tobacco Use  . Smoking status: Never Smoker  . Smokeless tobacco: Never Used  Substance Use Topics  . Alcohol use: No    Alcohol/week: 0.0 standard drinks  . Drug use: No    Comment: Used marijuana a few times in college.    Review of Systems  Constitutional: No  fever/chills Eyes: No visual changes. ENT: No sore throat. Cardiovascular: Denies chest pain. Respiratory: Denies shortness of breath. Gastrointestinal: No abdominal pain.  No nausea, no vomiting.  No diarrhea.  No constipation. Genitourinary: Negative for dysuria. Musculoskeletal: Negative for back pain. Skin: Negative for rash. Neurological: Negative for headaches, focal weakness or numbness. ____________________________________________   PHYSICAL EXAM:  VITAL SIGNS: ED Triage Vitals  Enc Vitals Group     BP 08/10/18 1711 (!) 179/106     Pulse Rate 08/10/18 1717 (!) 116     Resp 08/10/18 1717 16     Temp 08/10/18 1717 98.2 F (36.8 C)     Temp Source 08/10/18 1717 Oral     SpO2 08/10/18 1717 100 %     Weight 08/10/18 1718 199 lb (90.3 kg)     Height 08/10/18 1718 6' (1.829 m)     Head Circumference --      Peak Flow --      Pain Score 08/10/18 1717 0     Pain Loc --      Pain Edu? --      Excl. in [place]? --     Constitutional: Alert and oriented. Well appearing and in no acute distress. Eyes: Conjunctivae are normal. PERRL. EOMI. Head: Atraumatic. Nose: No congestion/rhinnorhea. Mouth/Throat: Mucous membranes are moist.  Oropharynx non-erythematous. Neck: No stridor.   Cardiovascular: Normal rate, regular rhythm. Grossly normal heart sounds.  Good peripheral circulation. Respiratory: Normal respiratory effort.  No retractions. Lungs CTAB. Gastrointestinal: Soft and nontender. No distention. No abdominal bruits. No CVA tenderness. Musculoskeletal: No lower extremity tenderness nor edema.  No joint effusions. Neurologic:  Normal speech and language. No gross focal neurologic deficits are appreciated. No gait instability. Skin:  Skin is warm, dry and intact. No rash noted. Psychiatric: Mood and affect are normal. Speech and behavior are normal.  ____________________________________________   LABS (all labs ordered are listed, but only abnormal results are  displayed)  Labs Reviewed  CBC WITH DIFFERENTIAL/PLATELET - Abnormal; Notable for the following components:      Result Value   Hemoglobin 17.2 (*)    All other components within normal limits  BASIC METABOLIC PANEL - Abnormal; Notable for the following components:   CO2 20 (*)    Glucose, Bld 122 (*)    Calcium 8.5 (*)    All other components within normal limits  GLUCOSE, CAPILLARY - Abnormal; Notable for the following components:   Glucose-Capillary 122 (*)    All other components within normal limits   ____________________________________________  EKG  ED ECG REPORT I, Sojourner Behringer, FNP-BC personally viewed and interpreted this ECG.   Date: 08/10/2018  EKG Time: 1714  Rate: 143  Rhythm:atrial fibrillation  Axis: normal  Intervals:none  ST&T Change: no ST elevation  ____________________________________________  RADIOLOGY  ED MD interpretation:  Not indicated.  Official radiology report(s): No results found.  ____________________________________________   PROCEDURES  Procedure(s) performed: None  Procedures  Critical Care performed: No  ____________________________________________   INITIAL IMPRESSION / ASSESSMENT AND PLAN / ED COURSE    63 year old male presenting to the emergency department from the endoscopy suite.  He was scheduled to have a colonoscopy this afternoon and when he went in for the procedure he was found to be in atrial fibrillation/atrial flutter.  He is asymptomatic at this time.  Patient feels that this is due to not being able to take his medications, however he is not on anything for rate control and is not currently anticoagulated because he had an ablation in 2017 that was successful in bringing him back into a sinus rhythm.  Patient states that he leads a fairly active lifestyle and that he monitors his heart rate and blood pressure quite frequently.  He states that he wears a Fitbit that keeps track of his heart rate and denies  having had any rapid rate recently.  Patient is in and out of atrial fibrillation.  He is as high as the 150s, but then within a few minutes he will be a sinus rhythm in the 60s.  Will monitor him for some time to see if he maintains this pattern or if he returns to sinus rhythm.  Because he does convert on his own, antiarrhythmics held at this point.  Case was discussed with cardiology on-call, Dr. Harrell Gave.  Per her recommendation, I will prescribe him metoprolol 12.5 mg to be taken twice per day.  He will also be encouraged to take an 81 mg aspirin.  We discussed additional anticoagulation, however at this time she does not recommend anything further.  He will also be advised to call the office if he begins to feel tired and sluggish secondary to new medication.  He has a scheduled follow-up with Dr. Rockey Situ on August 15, 2018.  They were strongly advised to keep that scheduled appointment.  He was also advised that he should return to the emergency department immediately if he begins to have any chest pain or pressure or shortness of breath.  He was advised to keep track of his heart rate and blood pressure as well.  Patient is in agreement with all of the above recommendations and agrees to return with any symptoms of concern.  Clinical Course as of Aug 10 2129  Fri Aug 11, 2018  2124 Neutrophils: 101 [CT]    Clinical Course User Index [CT] Girtie Wiersma B, FNP     ____________________________________________   FINAL CLINICAL IMPRESSION(S) / ED DIAGNOSES  Final diagnoses:  Atrial fibrillation, unspecified type [facility])     ED Discharge Orders         Ordered    metoprolol tartrate (LOPRESSOR) 25 MG tablet  2 times daily     08/10/18 1858           Note:  This document was prepared using Dragon voice recognition software and may include unintentional dictation errors.    Victorino Dike, FNP 08/11/18 2131    Schuyler Amor, MD 08/17/18 [phone]

## 2018-08-10 NOTE — ED Notes (Signed)
BG 122.

## 2018-08-10 NOTE — ED Notes (Signed)
Pt converted to NSR sustained at 75. EKG completed.

## 2018-08-10 NOTE — ED Triage Notes (Addendum)
Pt in from Endo d/t inc HR ranging from 80-160s. Currently sustaining in 140s A Flutter. Pt here to have colonoscopy today but not yet completed d/t sudden alteration in HR. EKG completed in Endo and 2nd EKG completed on arrival to ER. PT denies CP, palpitations. Pt on 2L O2 [place] on arrival; sat 100% on 2L. Pt A&Ox4, calm. Labetalol and metoprolol given by Endo staff. BG 105 at 1330 per Endo staff. History of ablation.

## 2018-08-10 NOTE — Interval H&P Note (Signed)
Prior to starting the procedure or giving any meds, patient's HR was noted to be elevated. Pt remained asymptomatic without any chest pain, shortness or breath, headache. Dr. Kayleen Memos and other anesthesia staff in to evaluate the patient and do not recommend proceeding with colonoscopy today. Pt reports history of ablation for Afib 2 years ago. Not on any chronic meds for it. Anesthesia administering meds for decreasing HR. Pt is being sent to the ED for further evaluation. Pt agreeable with plan. Family informed as well. Colonsocopy to be rescheduled after cardiac workup and clearance at a later time electively.    Jeffrey Ortiz

## 2018-08-10 NOTE — ED Notes (Signed)
Pt urinated 475cc.

## 2018-08-10 NOTE — ED Notes (Signed)
Pt given food tray and drink verbal okay from provider C. Triplett.

## 2018-08-10 NOTE — Anesthesia Post-op Follow-up Note (Signed)
Anesthesia QCDR form completed.        

## 2018-08-10 NOTE — ED Notes (Signed)
R fa 22g IV placed by Endo staff. Will send bloodwork to lab now.

## 2018-08-10 NOTE — ED Notes (Signed)
Pat pt's sister and Manuela Schwartz pt's wife updated by pt over the phone. This RN at bedside to answer any questions pt/family has.

## 2018-08-10 NOTE — Anesthesia Postprocedure Evaluation (Signed)
Anesthesia Post Note  Patient: Jeffrey Ortiz  Procedure(s) Performed: COLONOSCOPY WITH PROPOFOL (N/A )  Patient location during evaluation: Endoscopy Level of consciousness: awake and alert and oriented Respiratory status: spontaneous breathing Comments: Case was not started/cancelled and the patient was sent down to the ER for Fib/flutter with an increase rate.  Meds given prior to send off to ER to slow the rate down.     Last Vitals:  Vitals:   08/10/18 1343  BP: (!) 174/97  Pulse: 75  Resp: 18  Temp: (!) 36 C  SpO2: 99%    Last Pain:  Vitals:   08/10/18 1343  TempSrc: Tympanic                 Tyese Finken

## 2018-08-10 NOTE — H&P (Signed)
Vonda Antigua, MD [address], [place], [place], [place], [address] [address], [place], [place], [place], [address] Phone: [phone]  Fax: [phone]  Primary Care Physician:  [name], FNP   Pre-Procedure History & Physical: HPI:  Jeffrey Ortiz is a 63 y.o. male is here for a colonoscopy.   Past Medical History:  Diagnosis Date  . Atrial flutter ([place])    a. 8.2017 s/p RFCA.  Marland Kitchen Chronic systolic CHF (congestive heart failure) ([place])    a. 09/2015 Echo: EF 40-45%, diff HK.  . Essential hypertension   . Hyperlipidemia   . Hypertension   . Overweight   . Sleep apnea    "did study 2 days ago; CPAP ordered" (09/26/2015)  . Type II diabetes mellitus ([place])     Past Surgical History:  Procedure Laterality Date  . ELECTROPHYSIOLOGIC STUDY N/A 09/26/2015   Procedure: A-Flutter;  Surgeon: Deboraha Sprang, MD;  Location: [place] CV LAB;  Service: Cardiovascular;  Laterality: N/A;  . TEE WITHOUT CARDIOVERSION N/A 08/13/2015   Procedure: TRANSESOPHAGEAL ECHOCARDIOGRAM (TEE) and cardioversion;  Surgeon: Minna Merritts, MD;  Location: [facility] ORS;  Service: Cardiovascular;  Laterality: N/A;    Prior to Admission medications   Medication Sig Start Date End Date Taking? Authorizing Provider  empagliflozin (JARDIANCE) 10 MG TABS tablet Take 10 mg by mouth daily. 07/05/18  Yes [name], FNP  ezetimibe (ZETIA) 10 MG tablet Take 1 tablet (10 mg total) by mouth daily. 07/05/18  Yes [name], FNP  hydrochlorothiazide (HYDRODIURIL) 25 MG tablet Take 1 tablet (25 mg total) by mouth daily. 07/05/18  Yes Arnett, Yvetta Coder, FNP  lisinopril (PRINIVIL,ZESTRIL) 20 MG tablet Take 1 tablet (20 mg total) by mouth daily. 11/08/17  Yes [name], FNP  metFORMIN (GLUCOPHAGE) 500 MG tablet Take 2 tablets (1,000 mg total) by mouth 2 (two) times daily. 07/05/18  Yes [name], FNP  rosuvastatin (CRESTOR) 10 MG tablet Take 1 tablet (10 mg total) by mouth  daily. 07/05/18  Yes [name], FNP    Allergies as of 07/20/2018  . (No Known Allergies)    Family History  Problem Relation Age of Onset  . Heart attack Father        died in his 32's w/ cancer but dx CAD in his 44's.  . Cancer Father   . Other Mother        alive @ 89  . Heart attack Brother        died in early 71's.  . Diabetes Brother     Social History   Socioeconomic History  . Marital status: Married    Spouse name: Not on file  . Number of children: Not on file  . Years of education: Not on file  . Highest education level: Not on file  Occupational History  . Not on file  Social Needs  . Financial resource strain: Not on file  . Food insecurity    Worry: Not on file    Inability: Not on file  . Transportation needs    Medical: Not on file    Non-medical: Not on file  Tobacco Use  . Smoking status: Never Smoker  . Smokeless tobacco: Never Used  Substance and Sexual Activity  . Alcohol use: No    Alcohol/week: 0.0 standard drinks  . Drug use: No    Comment: Used marijuana a few times in college.  Marland Kitchen Sexual activity: Not Currently  Lifestyle  .  Physical activity    Days per week: Not on file    Minutes per session: Not on file  . Stress: Not on file  Relationships  . Social Herbalist on phone: Not on file    Gets together: Not on file    Attends religious service: Not on file    Active member of club or organization: Not on file    Attends meetings of clubs or organizations: Not on file    Relationship status: Not on file  . Intimate partner violence    Fear of current or ex partner: Not on file    Emotionally abused: Not on file    Physically abused: Not on file    Forced sexual activity: Not on file  Other Topics Concern  . Not on file  Social History Narrative   Lives locally with wife.  Does not routinely exercise.      Review of Systems: See HPI, otherwise negative ROS  Physical Exam: BP (!) 174/97 Comment: bp  meds not takin for two days  Pulse 75   Temp (!) 96.8 F (36 C) (Tympanic)   Resp 18   Ht 6' (1.829 m)   Wt 90.3 kg   SpO2 99%   BMI 26.99 kg/m  General:   Alert,  pleasant and cooperative in NAD Head:  Normocephalic and atraumatic. Neck:  Supple; no masses or thyromegaly. Lungs:  Clear throughout to auscultation, normal respiratory effort.    Heart:  +S1, +S2, Regular rate and rhythm, No edema. Abdomen:  Soft, nontender and nondistended. Normal bowel sounds, without guarding, and without rebound.   Neurologic:  Alert and  oriented x4;  grossly normal neurologically.  Impression/Plan: Shuayb Schepers is here for a colonoscopy to be performed for average risk screening.  Risks, benefits, limitations, and alternatives regarding  colonoscopy have been reviewed with the patient.  Questions have been answered.  All parties agreeable.   Virgel Manifold, MD  08/10/2018, 1:55 PM

## 2018-08-10 NOTE — ED Notes (Signed)
Provider C. Triplett at bedside.

## 2018-08-10 NOTE — OR Nursing (Signed)
[place] . HEART RATE UP TO 160S. PT ASYMPTOMATIC . DR Kayleen Memos NOTIFIED BY CRNA [place]. . DR Kayleen Memos EVALUATED PT AND ADM 10MG  LABETALOL AND 10MG  OF METOPROLOL WITH NO RESPONSE. BP ALSO ELEVATED. DR Donalynn Furlong  IN ROOM . PROCEDURE CANCELLED. EMERGENCY DEPARTMENT NOTIFIED . PT TRANSPORTED TO ED DEPARTMENT VIA CLOSE MONITORING . TRANSFERRED TO ED 13. REPORT GIVEN TO STAFFF. WIFE ALSO NOTIFIED OF TRANSFER TO ED

## 2018-08-10 NOTE — ED Notes (Addendum)
This RN reviewed home meds from [place] with pt to verify that system has updated info for what pt regularly takes per mid-level C. Triplett request.

## 2018-08-10 NOTE — Anesthesia Preprocedure Evaluation (Signed)
Anesthesia Evaluation  Patient identified by MRN, date of birth, ID band Patient awake    Reviewed: Allergy & Precautions, NPO status , Patient's Chart, lab work & pertinent test results, reviewed documented beta blocker date and time   Airway Mallampati: III  TM Distance: >3 FB     Dental  (+) Chipped   Pulmonary sleep apnea ,           Cardiovascular hypertension, Pt. on medications +CHF  + dysrhythmias Atrial Fibrillation      Neuro/Psych    GI/Hepatic GERD  ,  Endo/Other  diabetes, Type 2  Renal/GU      Musculoskeletal   Abdominal   Peds  Hematology   Anesthesia Other Findings Obese. EF 3 yr ago 40-45.  Reproductive/Obstetrics                             Anesthesia Physical Anesthesia Plan  ASA: III  Anesthesia Plan: General   Post-op Pain Management:    Induction: Intravenous  PONV Risk Score and Plan:   Airway Management Planned:   Additional Equipment:   Intra-op Plan:   Post-operative Plan:   Informed Consent: I have reviewed the patients History and Physical, chart, labs and discussed the procedure including the risks, benefits and alternatives for the proposed anesthesia with the patient or authorized representative who has indicated his/her understanding and acceptance.       Plan Discussed with: CRNA  Anesthesia Plan Comments:         Anesthesia Quick Evaluation

## 2018-08-10 NOTE — Transfer of Care (Signed)
Immediate Anesthesia Transfer of Care Note  Patient: Jeffrey Ortiz  Procedure(s) Performed: COLONOSCOPY WITH PROPOFOL (N/A )  Patient Location: PACU  Anesthesia Type:MAC  Level of Consciousness: awake, alert  and oriented  Airway & Oxygen Therapy: Patient Spontanous Breathing and Patient connected to nasal cannula oxygen  Post-op Assessment: Report given to RN and Post -op Vital signs reviewed and stable  Post vital signs: Reviewed and stable  Last Vitals:  Vitals Value Taken Time  BP    Temp    Pulse    Resp    SpO2      Last Pain:  Vitals:   08/10/18 1343  TempSrc: Tympanic         Complications: No apparent anesthesia complications

## 2018-08-10 NOTE — ED Notes (Signed)
Pt urinated 400cc.

## 2018-08-10 NOTE — ED Notes (Signed)
Pt continues to flip between NSR 70s and Afib max 130s.

## 2018-08-11 ENCOUNTER — Encounter: Payer: Self-pay | Admitting: Gastroenterology

## 2018-08-11 LAB — GLUCOSE, CAPILLARY: Glucose-Capillary: 122 mg/dL — ABNORMAL HIGH (ref 70–99)

## 2018-08-14 ENCOUNTER — Telehealth: Payer: Self-pay | Admitting: Cardiovascular Disease

## 2018-08-14 NOTE — Progress Notes (Deleted)
Patient ID: Jeffrey Ortiz, male   DOB: 07/29/1955, 63 y.o.   MRN: [id number] Cardiology Office Note  Date:  08/14/2018   ID:  Jeffrey Ortiz, DOB 1955-03-31, MRN [id number]  PCP:  [name], FNP   No chief complaint on file.   HPI:  Jeffrey Ortiz is a 63 year old gentleman with a history of  GERD,  Diabetes, hypertension,  hyperlipidemia,  hospital 08/11/2015 with worsening shortness of breath, orthopnea, PND started after severe GERD, vomiting, aspiration into his sinuses noted to be in atrial flutter with RVR underwent TEE with cardioversion, initially successful, converted to atrial fibrillation Later went back into atrial flutter Ejection fraction 25 to 30% up to 55 to 60% and normal rhythm He presents today for follow-up of his atrial flutter and atrial fibrillation, persistent  Last seen by cardiology in 2017 CHF clinic in 02/2017  Xarelto held September 2017 Lasix potassium also held at that time  colonoscopy scheduled and upon arrival was told he had too much water and procedure would be delayed .  He was rescheduled or moved from 930 am to 530 pm procedure and waited all day while fasting and without meds.  Patient was discovered at time of procedure to be in aflutter by anesthesiologist and was taken to ED for an evaluation as HR was 160's   Patient procedure cancelled   Patient started on metoprolol 12.5 mg po BID in ED   Previously taking amiodarone 400 mg twice a day, Coreg 12.5 mg twice a day, diltiazem SR 60  mg twice a day, lisinopril 10 mg daily  Xarelto 20 mg daily   EKG on today's visit shows atrial flutter with ventricular rate 72 bpm, nonspecific ST and T wave abnormality   PMH:   has a past medical history of Atrial flutter ([place]), Chronic systolic CHF (congestive heart failure) ([place]), Essential hypertension, Hyperlipidemia, Hypertension, Overweight, Sleep apnea, and Type II diabetes mellitus ([place]).  PSH:    Past Surgical History:  Procedure  Laterality Date  . COLONOSCOPY WITH PROPOFOL N/A 08/10/2018   Procedure: COLONOSCOPY WITH PROPOFOL;  Surgeon: Virgel Manifold, MD;  Location: [facility] ENDOSCOPY;  Service: Endoscopy;  Laterality: N/A;  . ELECTROPHYSIOLOGIC STUDY N/A 09/26/2015   Procedure: A-Flutter;  Surgeon: Deboraha Sprang, MD;  Location: [place] [facility];  Service: Cardiovascular;  Laterality: N/A;  . TEE WITHOUT CARDIOVERSION N/A 08/13/2015   Procedure: TRANSESOPHAGEAL ECHOCARDIOGRAM (TEE) and cardioversion;  Surgeon: Minna Merritts, MD;  Location: [facility] ORS;  Service: Cardiovascular;  Laterality: N/A;    Current Outpatient Medications  Medication Sig Dispense Refill  . empagliflozin (JARDIANCE) 10 MG TABS tablet Take 10 mg by mouth daily. 90 tablet 1  . ezetimibe (ZETIA) 10 MG tablet Take 1 tablet (10 mg total) by mouth daily. 90 tablet 0  . hydrochlorothiazide (HYDRODIURIL) 25 MG tablet Take 1 tablet (25 mg total) by mouth daily. 90 tablet 1  . lisinopril (PRINIVIL,ZESTRIL) 20 MG tablet Take 1 tablet (20 mg total) by mouth daily. 90 tablet 0  . metFORMIN (GLUCOPHAGE) 500 MG tablet Take 2 tablets (1,000 mg total) by mouth 2 (two) times daily. 360 tablet 1  . metoprolol tartrate (LOPRESSOR) 25 MG tablet Take 0.5 tablets (12.5 mg total) by mouth 2 (two) times daily. 15 tablet 0  . rosuvastatin (CRESTOR) 10 MG tablet Take 1 tablet (10 mg total) by mouth daily. 90 tablet 3   No current facility-administered medications for this visit.      Allergies:   Patient has no  known allergies.   Social History:  The patient  reports that he has never smoked. He has never used smokeless tobacco. He reports that he does not drink alcohol or use drugs.   Family History:   family history includes Cancer in his father; Diabetes in his brother; Heart attack in his brother and father; Other in his mother.    Review of Systems: Review of Systems  Constitutional: Positive for malaise/fatigue.  Respiratory: Positive for shortness of  breath.   Cardiovascular: Negative.   Gastrointestinal: Negative.   Musculoskeletal: Negative.   Neurological: Negative.   Psychiatric/Behavioral: Negative.   All other systems reviewed and are negative.    PHYSICAL EXAM: VS:  There were no vitals taken for this visit. , BMI There is no height or weight on file to calculate BMI. GEN: Well nourished, well developed, in no acute distress HEENT: normal Neck: no JVD, carotid bruits, or masses Cardiac: RRR; no murmurs, rubs, or gallops,no edema  Respiratory:  clear to auscultation bilaterally, normal work of breathing GI: soft, nontender, nondistended, + BS MS: no deformity or atrophy Skin: warm and dry, no rash Neuro:  Strength and sensation are intact Psych: euthymic mood, full affect    Recent Labs: 09/12/2017: ALT 18; TSH 0.64 08/10/2018: BUN 10; Creatinine, Ser 0.88; Hemoglobin 17.2; Platelets 262; Potassium 3.6; Sodium 140    Lipid Panel Lab Results  Component Value Date   CHOL 140 09/12/2017   HDL 38.50 (L) 09/12/2017   LDLCALC 85 09/12/2017   TRIG 80.0 09/12/2017      Wt Readings from Last 3 Encounters:  08/10/18 199 lb (90.3 kg)  08/10/18 199 lb (90.3 kg)  05/20/17 204 lb 2 oz (92.6 kg)       ASSESSMENT AND PLAN:  Atrial flutter with rapid ventricular response ([place]) - Plan: EKG 12-Lead Ventricular rate dramatically improved on current medications We will decrease the amiodarone down to 200 mg twice a day Recommended he closely monitor his heart rate at home and if heart rate increases more than 110 bpm, suggested he go back on high-dose amiodarone 400 mg twice a day until seen by Dr. Caryl Comes. I suspect he will need flutter ablation after his sleep study in August given the difficulty controlling his ventricular rate while in flutter.  Paroxysmal atrial fibrillation ([facility]) - Plan: EKG 12-Lead He did have atrial fibrillation following cardioversion This may be less of an issue on amiodarone, He did convert from  atrial fibrillation to atrial flutter in the hospital Flutter seems to be his predominant rhythm causing most of his symptoms with difficult to control rate Atrial fibrillation rate in the hospital 70 to 80  bpm  Acute systolic CHF (congestive heart failure) ([place]) There was a climb in his creatinine close to the time of discharge. Fluid is now gone, he feels better Recommended he decrease Lasix down to every other day with potassium 20 mEq  Shortness of breath Shortness of breath has resolved, significant diuresis since his hospital discharge Decreased Lasix as above  Sleep apnea Discussed pending sleep study with him. Scheduled for early August   Total encounter time more than 25 minutes  Greater than 50% was spent in counseling and coordination of care with the patient   Disposition:   F/U  6 months   No orders of the defined types were placed in this encounter.    Signed, Esmond Plants, M.D., Ph.D. 08/14/2018  [place], [place]

## 2018-08-14 NOTE — Telephone Encounter (Signed)
Patient wants Dr. Rockey Situ to be aware for tomorrows appt:  He had a colonoscopy scheduled and upon arrival was told he had too much water and procedure would be delayed .  He was rescheduled or moved from 930 am to 530 pm procedure and waited all day while fasting and without meds.  Patient was discovered at time of procedure to be in aflutter by anesthesiologist and was taken to ED for an evaluation as HR was 160's   Patient procedure cancelled   Patient started on metoprolol 12.5 mg po BID in ED   Patient states HR not is in 80's and he will discuss further with Dr. Rockey Situ at [facility].

## 2018-08-14 NOTE — Telephone Encounter (Signed)
Routing to Dr Rockey Situ as Juluis Rainier for tomorrow's scheduled appointment with him.

## 2018-08-14 NOTE — Telephone Encounter (Signed)

## 2018-08-15 ENCOUNTER — Encounter: Payer: Self-pay | Admitting: *Deleted

## 2018-08-15 ENCOUNTER — Other Ambulatory Visit: Payer: Self-pay

## 2018-08-15 ENCOUNTER — Ambulatory Visit: Payer: BLUE CROSS/BLUE SHIELD | Admitting: Cardiovascular Disease

## 2018-08-15 NOTE — Telephone Encounter (Signed)
Anderson Malta from front office noticed earlier today that patient had [facility] which is out of network for our office Anderson Malta called and left patient a detailed message regarding his insurance issue Patient came to appointment unaware - says he called back but whoever answered was unaware of the situation  Patient retrieved his card and demanded that Dr Rockey Situ call him and left

## 2018-08-15 NOTE — Telephone Encounter (Signed)
This encounter was created in error - please disregard.

## 2018-08-16 NOTE — Telephone Encounter (Signed)
Spoke with patient and he states that he was very upset he is out of network now. He had recent colonoscopy and after a huge delay they found that he was in aflutter 169-170's. They then canceled his procedure and took him to ED for evaluation and treatment. Once discharged he was scheduled to see [place] but did not realize that we are now out of network with his insurance. So he showed up for appointment and did not know that. Expressed apologies to him for this inconvenience and he wanted me to make sure that Dr. Rockey Situ is aware that he wishes he could see him but can't afford out of network expenses. He did say that you could call him and that he really hates that he has to change all of his providers. He was very appreciative for my call back and states that we have all been so kind to him and our office was the best.

## 2018-08-17 ENCOUNTER — Ambulatory Visit (INDEPENDENT_AMBULATORY_CARE_PROVIDER_SITE_OTHER): Payer: BLUE CROSS/BLUE SHIELD | Admitting: Internal Medicine

## 2018-08-17 ENCOUNTER — Telehealth: Payer: Self-pay | Admitting: Family

## 2018-08-17 ENCOUNTER — Other Ambulatory Visit: Payer: BLUE CROSS/BLUE SHIELD

## 2018-08-17 ENCOUNTER — Other Ambulatory Visit: Payer: Self-pay

## 2018-08-17 ENCOUNTER — Encounter: Payer: Self-pay | Admitting: Internal Medicine

## 2018-08-17 DIAGNOSIS — M545 Low back pain, unspecified: Secondary | ICD-10-CM

## 2018-08-17 DIAGNOSIS — I480 Paroxysmal atrial fibrillation: Secondary | ICD-10-CM

## 2018-08-17 DIAGNOSIS — N3000 Acute cystitis without hematuria: Secondary | ICD-10-CM

## 2018-08-17 DIAGNOSIS — E785 Hyperlipidemia, unspecified: Secondary | ICD-10-CM

## 2018-08-17 DIAGNOSIS — I10 Essential (primary) hypertension: Secondary | ICD-10-CM

## 2018-08-17 DIAGNOSIS — I4892 Unspecified atrial flutter: Secondary | ICD-10-CM | POA: Diagnosis not present

## 2018-08-17 DIAGNOSIS — G4730 Sleep apnea, unspecified: Secondary | ICD-10-CM

## 2018-08-17 DIAGNOSIS — R300 Dysuria: Secondary | ICD-10-CM

## 2018-08-17 DIAGNOSIS — I5022 Chronic systolic (congestive) heart failure: Secondary | ICD-10-CM

## 2018-08-17 MED ORDER — CIPROFLOXACIN HCL 500 MG PO TABS: 500.0000 mg | ORAL_TABLET | Freq: Two times a day (BID) | ORAL | 0 refills | Status: DC

## 2018-08-17 MED ORDER — METOPROLOL TARTRATE 25 MG PO TABS: 25.0000 mg | ORAL_TABLET | Freq: Two times a day (BID) | ORAL | 2 refills | Status: DC

## 2018-08-17 NOTE — Telephone Encounter (Signed)
Called and spoke to patient.  Patient said that he got a colonoscopy done on last Thursday (June 25).  Patient said the procedure was cancelled because his heart was in flutter.  Patient said that he had a beta blocker done and was sent to ED.  Patient said that he was without his meds and food for 2 days.  Patient said he was released from the ED later that night.  Patient said that he is now taking metoprolol 25 mg twice a day 1/2 of tab every 12 hours prescribed in ER.    Patient denies having any symptoms today and said that his heart rate is 127 while talking on phone.  No shortness of breath, no chest pain/ chest tightness, no nausea, no sweating. Patient says that his heart rate spikes up and then goes back down.  Patient said he had a heart ablation about 2 years ago.   Patient is requesting a referral to a cardiologist at [facility] since his current cardiologist is out-of-network.    Patient said that he is also having a little burning with urination that started about 2 or 3 days ago.    Patient informed that PCP [name] is not in the office today.  Patient scheduled a virtual visit with Dr. Aundra Dubin today at 4:00 pm and lab appt at 3:15 to give a urine sample.  Williams with Dr. Aundra Dubin to put on her schedule.  Patient advised to go to ED if symptoms worsen.

## 2018-08-17 NOTE — Progress Notes (Addendum)
Virtual Visit via Video Note  I connected with Jeffrey Ortiz   on 08/17/18 at  4:00 PM EDT by a video enabled telemedicine application and verified that I am speaking with the correct person using two identifiers.  Location patient: home Location provider:work  Persons participating in the virtual visit: patient, provider  I discussed the limitations of evaluation and management by telemedicine and the availability of in person appointments. The patient expressed understanding and agreed to proceed.   HPI: 1. 08/10/18 he was scheduled for colonoscopy but during the time of anesthesia and during the wait of multiple hours and multiple IV sticks x 6 he had Atrial flutter which he has a history of. He reports he spent 2 days prepping for colonoscopy and had reduced oral intake. When atrial flutter happened around 3:30 or 4 pm they sent him to the ED his heart rate has been fluctuating in the 130s-189. Today it is 15. His glucuse in the Ed was 122 -Unfortunately his cardiologist Dr. Rockey Ortiz and Prior EP Dr. Caryl Ortiz are not in his network and he needs a referral to a cardiologist in the Jeffrey Ortiz home network which is Jeffrey Ortiz.  -he is s/p ablation 2.5 years ago   He denies chest pain but heart rate is still fluctuating on his fit bit and on metoprolol 12.5 mg bid   2. Burning with urination on Tuesday 08/15/2018 he is on jardiance and we reviewed risk of UTI on this medication. He also complains of right back pain which is new over the last 1 week. No fever no chills and he has increased his water intake.    Of note for now he will have to change his Ortiz to a Jeffrey Ortiz as Jeffrey Ortiz does not take Jeffrey Ortiz home plan   ROS: See pertinent positives and negatives per HPI.  Past Medical History:  Diagnosis Date  . Atrial flutter ([place])    a. 8.2017 s/p RFCA.  Marland Kitchen Chronic systolic CHF (congestive heart failure) ([place])    a. 09/2015 Echo: EF 40-45%, diff HK.  . Essential hypertension   . Hyperlipidemia   .  Hypertension   . Overweight   . Sleep apnea    "did study 2 days ago; CPAP ordered" (09/26/2015)  . Type II diabetes mellitus ([place])     Past Surgical History:  Procedure Laterality Date  . COLONOSCOPY WITH PROPOFOL N/A 08/10/2018   Procedure: COLONOSCOPY WITH PROPOFOL;  Surgeon: Jeffrey Manifold, MD;  Location: [facility] ENDOSCOPY;  Service: Endoscopy;  Laterality: N/A;  . ELECTROPHYSIOLOGIC STUDY N/A 09/26/2015   Procedure: A-Flutter;  Surgeon: Jeffrey Sprang, MD;  Location: [place] [facility] LAB;  Service: Cardiovascular;  Laterality: N/A;  . TEE WITHOUT CARDIOVERSION N/A 08/13/2015   Procedure: TRANSESOPHAGEAL ECHOCARDIOGRAM (TEE) and cardioversion;  Surgeon: Jeffrey Merritts, MD;  Location: [facility] ORS;  Service: Cardiovascular;  Laterality: N/A;    Family History  Problem Relation Age of Onset  . Heart attack Father        died in his 6's w/ cancer but dx CAD in his 36's.  . Cancer Father   . Other Mother        alive @ 24  . Heart attack Brother        died in early 54's.  . Diabetes Brother     SOCIAL HX: married  Works [employer] in [place]    Current Outpatient Medications:  .  ciprofloxacin (CIPRO) 500 MG tablet, Take 1 tablet (500 mg total) by mouth  2 (two) times daily. With food, Disp: 14 tablet, Rfl: 0 .  empagliflozin (JARDIANCE) 10 MG TABS tablet, Take 10 mg by mouth daily., Disp: 90 tablet, Rfl: 1 .  ezetimibe (ZETIA) 10 MG tablet, Take 1 tablet (10 mg total) by mouth daily., Disp: 90 tablet, Rfl: 0 .  hydrochlorothiazide (HYDRODIURIL) 25 MG tablet, Take 1 tablet (25 mg total) by mouth daily., Disp: 90 tablet, Rfl: 1 .  lisinopril (PRINIVIL,ZESTRIL) 20 MG tablet, Take 1 tablet (20 mg total) by mouth daily., Disp: 90 tablet, Rfl: 0 .  metFORMIN (GLUCOPHAGE) 500 MG tablet, Take 2 tablets (1,000 mg total) by mouth 2 (two) times daily., Disp: 360 tablet, Rfl: 1 .  metoprolol tartrate (LOPRESSOR) 25 MG tablet, Take 1 tablet (25 mg total) by mouth 2 (two) times daily., Disp: 60  tablet, Rfl: 2 .  rosuvastatin (CRESTOR) 10 MG tablet, Take 1 tablet (10 mg total) by mouth daily., Disp: 90 tablet, Rfl: 3  EXAM:  VITALS per patient if applicable:  GENERAL: alert, oriented, appears well and in no acute distress  HEENT: atraumatic, conjunttiva clear, no obvious abnormalities on inspection of external nose and ears  NECK: normal movements of the head and neck  LUNGS: on inspection no signs of respiratory distress, breathing rate appears normal, no obvious gross SOB, gasping or wheezing  CV: no obvious cyanosis  MS: moves all visible extremities without noticeable abnormality  PSYCH/NEURO: pleasant and cooperative, no obvious depression or anxiety, speech and thought processing grossly intact  ASSESSMENT AND PLAN:  Discussed the following assessment and plan:  Dysuria with right back pain ddx UTI/pyelonephritis  Plan: Urine Culture, Urinalysis, Routine w reflex microscopic cipro 500 mg bid x 1 week  -if he has UTI on Jardiance this medication needs to be re-evaluated for use  -if right back pain does not get better consider CT ab/pelvis with contrast to w/u pyelo   Essential hypertension/HLD/h/o Afib/flutter s/p ablation ~2017/2018 Dr.Steve Jeffrey Ortiz. Recently with rate uncontrolled. H/o chronic systolic CHF plan Ambulatory referral to Cardiology Jeffrey EP Dr. James Ortiz or Dr. Stark Ortiz likely needs repeat Echo and further w/u  Increase Metoprolol 12.5 mg bid to 25 mg bid  Also discuss with pt check manual heart rate as well as use of fit bit for comparison explained how to do this today     HM Of note colonoscopy was cancelled and will need to be rescheduled if pt agreeable after cards clearance    Pt will need to change to [facility] Ortiz due to insurance   I discussed the assessment and treatment plan with the patient. The patient was provided an opportunity to ask questions and all were answered. The patient agreed with the plan and demonstrated an understanding of  the instructions.   The patient was advised to call back or seek an in-person evaluation if the symptoms worsen or if the condition fails to improve as anticipated.  Time spent 15 minutes  Delorise Jackson, MD

## 2018-08-17 NOTE — Patient Instructions (Signed)
Urinary Tract Infection, Adult  A urinary tract infection (UTI) is an infection of any part of the urinary tract. The urinary tract includes the kidneys, ureters, bladder, and urethra. These organs make, store, and get rid of urine in the body. Your health care provider may use other names to describe the infection. An upper UTI affects the ureters and kidneys (pyelonephritis). A lower UTI affects the bladder (cystitis) and urethra (urethritis). What are the causes? Most urinary tract infections are caused by bacteria in your genital area, around the entrance to your urinary tract (urethra). These bacteria grow and cause inflammation of your urinary tract. What increases the risk? You are more likely to develop this condition if:  You have a urinary catheter that stays in place (indwelling).  You are not able to control when you urinate or have a bowel movement (you have incontinence).  You are male and you: ? Use a spermicide or diaphragm for birth control. ? Have low estrogen levels. ? Are pregnant.  You have certain genes that increase your risk (genetics).  You are sexually active.  You take antibiotic medicines.  You have a condition that causes your flow of urine to slow down, such as: ? An enlarged prostate, if you are male. ? Blockage in your urethra (stricture). ? A kidney stone. ? A nerve condition that affects your bladder control (neurogenic bladder). ? Not getting enough to drink, or not urinating often.  You have certain medical conditions, such as: ? Diabetes. ? A weak disease-fighting system (immunesystem). ? Sickle cell disease. ? Gout. ? Spinal cord injury. What are the signs or symptoms? Symptoms of this condition include:  Needing to urinate right away (urgently).  Frequent urination or passing small amounts of urine frequently.  Pain or burning with urination.  Blood in the urine.  Urine that smells bad or unusual.  Trouble urinating.  Cloudy  urine.  Vaginal discharge, if you are male.  Pain in the abdomen or the lower back. You may also have:  Vomiting or a decreased appetite.  Confusion.  Irritability or tiredness.  A fever.  Diarrhea. The first symptom in older adults may be confusion. In some cases, they may not have any symptoms until the infection has worsened. How is this diagnosed? This condition is diagnosed based on your medical history and a physical exam. You may also have other tests, including:  Urine tests.  Blood tests.  Tests for sexually transmitted infections (STIs). If you have had more than one UTI, a cystoscopy or imaging studies may be done to determine the cause of the infections. How is this treated? Treatment for this condition includes:  Antibiotic medicine.  Over-the-counter medicines to treat discomfort.  Drinking enough water to stay hydrated. If you have frequent infections or have other conditions such as a kidney stone, you may need to see a health care provider who specializes in the urinary tract (urologist). In rare cases, urinary tract infections can cause sepsis. Sepsis is a life-threatening condition that occurs when the body responds to an infection. Sepsis is treated in the hospital with IV antibiotics, fluids, and other medicines. Follow these instructions at home:  Medicines  Take over-the-counter and prescription medicines only as told by your health care provider.  If you were prescribed an antibiotic medicine, take it as told by your health care provider. Do not stop using the antibiotic even if you start to feel better. General instructions  Make sure you: ? Empty your bladder often and   completely. Do not hold urine for long periods of time. ? Empty your bladder after sex. ? Wipe from front to back after a bowel movement if you are male. Use each tissue one time when you wipe.  Drink enough fluid to keep your urine pale yellow.  Keep all follow-up  visits as told by your health care provider. This is important. Contact a health care provider if:  Your symptoms do not get better after 1-2 days.  Your symptoms go away and then return. Get help right away if you have:  Severe pain in your back or your lower abdomen.  A fever.  Nausea or vomiting. Summary  A urinary tract infection (UTI) is an infection of any part of the urinary tract, which includes the kidneys, ureters, bladder, and urethra.  Most urinary tract infections are caused by bacteria in your genital area, around the entrance to your urinary tract (urethra).  Treatment for this condition often includes antibiotic medicines.  If you were prescribed an antibiotic medicine, take it as told by your health care provider. Do not stop using the antibiotic even if you start to feel better.  Keep all follow-up visits as told by your health care provider. This is important. This information is not intended to replace advice given to you by your health care provider. Make sure you discuss any questions you have with your health care provider. Document Released: 11/11/2004 Document Revised: 01/19/2018 Document Reviewed: 08/11/2017 Elsevier Patient Education  2020 [employer].  Atrial Flutter  Atrial flutter is a type of abnormal heart rhythm (arrhythmia). The heart has an electrical system that tells the heart how to beat. In atrial flutter, the signals move rapidly in the top chambers of the heart (the atria). This makes your heart beat very fast. Atrial flutter can come and go, or it can be permanent. If this condition is not treated it can cause serious complications, such as stroke or weakened heart muscle (cardiomyopathy). What are the causes? This condition may be caused by:  A heart condition or problem, such as: ? A heart attack. ? Heart failure. ? A heart valve problem. ? Heart surgery.  A lung problem, such as: ? A blood clot in the lungs (pulmonary embolism,  or PE). ? Chronic obstructive pulmonary disease.  Poorly controlled high blood pressure (hypertension).  Overactive thyroid (hyperthyroidism).  Caffeine.  Some decongestant cold medicines.  Low levels of minerals called electrolytes in the blood.  Cocaine. What increases the risk? You are more likely to develop this condition if:  You are an elderly adult.  You are a man.  You are obese.  You have obstructive sleep apnea.  You have a family history of atrial flutter.  You have diabetes. What are the signs or symptoms? Symptoms of this condition include:  A feeling that your heart is pounding or racing (palpitations).  Shortness of breath.  Chest pain.  Feeling light-headed.  Dizziness.  Fainting.  Low blood pressure (hypotension).  Fatigue. Sometimes there are no symptoms associated with arrhythmia. How is this diagnosed? This condition may be diagnosed based on:  An electrocardiogram (ECG). This is a test that records the electrical signals in the heart.  Ambulatory cardiac monitoring. This is a small recording device that is connected by wires to flat, sticky disks (electrodes) that are attached to your chest.  An echocardiogram. This is a test that uses sound waves to create pictures of your heart.  A transesophageal echocardiogram (TEE). In this test, a  device is placed down your esophagus. This device then uses sounds waves to create even closer pictures of your heart.  Stress test. This test records your heartbeat while you exercise and checks to see if the heart muscle is receiving adequate blood supply. How is this treated? This condition may be treated with:  Medicines to: ? Make your heart beat more slowly. ? Keep your heart in normal rhythm. ? Prevent a stroke.  Cardioversion. This uses medicines or an electrical shock to make the heart beat normally.  Ablation. This destroys the heart tissue that is causing the problem. In some cases,  your health care provider will treat other underlying conditions. Follow these instructions at home: Medicines  Take over-the-counter and prescription medicines only as told by your health care provider. ? Make sure you take your medicines exactly as told by your health care provider. ? Do not miss any doses.  Do not take any new medicines without talking to your health care provider. Lifestyle  Eat heart-healthy foods. Talk with a dietitian to make an eating plan that is right for you.  Do not use any products that contain nicotine or tobacco, such as cigarettes and e-cigarettes. If you need help quitting, ask your health care provider.  Limit alcohol intake to no more than 1 drink per day for nonpregnant women and 2 drinks per day for men. One drink equals 12 oz of beer, 5 oz of wine, or 1 oz of hard liquor.  Try to reduce any stress. Stress can make your symptoms worse.  Get screened for sleep apnea. If you have the condition, work with your health care provider to find a treatment that works for you.  Do not use drugs.  Avoid excessive caffeine. General instructions  Lose weight if your health care provider tells you to do that.  Keep all follow-up visits as told by your health care provider. This is important. Contact a health care provider if:  Your symptoms get worse.  You notice that your palpitations are increasing. Get help right away if:  You have any symptoms of a stroke. "BE FAST" is an easy way to remember the main warning signs of a stroke: ? B - Balance. Signs are dizziness, sudden trouble walking, or loss of balance. ? E - Eyes. Signs are trouble seeing or a sudden change in vision. ? F - Face. Signs are sudden weakness or numbness of the face, or the face or eyelid drooping on one side. ? A - Arms. Signs are weakness or numbness in an arm. This happens suddenly and usually on one side of the body. ? S - Speech. Signs are sudden trouble speaking, slurred  speech, or trouble understanding what people say. ? T - Time. Time to call emergency services. Write down what time symptoms started.  You have other signs of a stroke, such as: ? A sudden, severe headache with no known cause. ? Nausea or vomiting. ? Seizure.  You have additional symptoms, such as: ? Fainting. ? Shortness of breath. ? Pain or pressure in your chest. ? Suddenly feeling nauseous or suddenly vomiting. ? Increased sweating with no known cause.  These symptoms may represent a serious problem that is an emergency. Do not wait to see if the symptoms will go away. Get medical help right away. Call your local emergency services (911 in the [place] Do not drive yourself to the hospital. Summary  Atrial flutter is an abnormal heart rhythm that can give you symptoms  of palpitations, shortness of breath, or fatigue.  Atrial flutter is often treated with medicines to keep your heart in a normal rhythm and to prevent a stroke.  You should seek immediate help if you cannot catch your breath, have chest pain or pressure, or have weakness, especially on one side of your body. This information is not intended to replace advice given to you by your health care provider. Make sure you discuss any questions you have with your health care provider. Document Released: 06/20/2008 Document Revised: 10/21/2017 Document Reviewed: 11/04/2016 Elsevier Patient Education  2020 [place].  Atrial Fibrillation Atrial fibrillation is a type of irregular or rapid heartbeat (arrhythmia). In atrial fibrillation, the top part of the heart (atria) quivers in a chaotic pattern. This makes the heart unable to pump blood normally. Having atrial fibrillation can increase your risk for other health problems, such as:  Blood can pool in the atria and form clots. If a clot travels to the brain, it can cause a stroke.  The heart muscle may weaken from the irregular blood flow. This can cause heart failure.  Atrial fibrillation may start suddenly and stop on its own, or it may become a long-lasting problem. What are the causes? This condition is caused by some heart-related conditions or procedures, including:  High blood pressure. This is the most common cause.  Heart failure.  Heart valve conditions.  Inflammation of the sac that surrounds the heart (pericarditis).  Heart surgery.  Coronary artery disease.  Certain heart rhythm disorders, such as Wolf-Parkinson-White syndrome. Other causes include:  Pneumonia.  Obstructive sleep apnea.  Lung cancer.  Thyroid problems, especially if the thyroid is overactive (hyperthyroidism).  Excessive alcohol or drug use. Sometimes, the cause of this condition is not known. What increases the risk? This condition is more likely to develop in:  Older people.  People who smoke.  People who have diabetes mellitus.  People who are overweight (obese).  Athletes who exercise vigorously.  People who have a family history. What are the signs or symptoms? Symptoms of this condition include:  A feeling that your heart is beating rapidly or irregularly.  A feeling of discomfort or pain in your chest.  Shortness of breath.  Sudden light-headedness or weakness.  Getting tired easily during exercise. In some cases, there are no symptoms. How is this diagnosed? Your health care provider may be able to detect atrial fibrillation when taking your pulse. If detected, this condition may be diagnosed with:  Electrocardiogram (ECG).  Ambulatory cardiac monitor. This device records your heartbeats for 24 hours or more.  Transthoracic echocardiogram (TTE) to evaluate how blood flows through your heart.  Transesophageal echocardiogram (TEE) to view more detailed images of your heart.  A stress test.  Imaging tests, such as a CT scan or chest X-ray.  Blood tests. How is this treated? This condition may be treated with:  Medicines to  slow down the heart rate or bring the heart's rhythm back to normal.  Medicines to prevent blood clots from forming.  Electrical cardioversion. This delivers a low-energy shock to the heart to reset its rhythm.  Ablation. This procedure destroys the part of the heart tissue that sends abnormal signals.  Left atrial appendage occlusion/excision. This seals off a common place in the atria where blood clots can form (left atrial appendage). The goal of treatment is to prevent blood clots from forming and to keep your heart beating at a normal rate and rhythm. Treatment depends on underlying medical  conditions and how you feel when you are experiencing fibrillation. Follow these instructions at home: Medicines  Take over-the counter and prescription medicines only as told by your health care provider.  If your health care provider prescribed a blood-thinning medicine (anticoagulant), take it exactly as told. Taking too much blood-thinning medicine can cause bleeding. Taking too little can enable a blood clot to form and travel to the brain, causing a stroke. Lifestyle      Do not use any products that contain nicotine or tobacco, such as cigarettes and e-cigarettes. If you need help quitting, ask your health care provider.  Do not drink beverages that contain caffeine, such as coffee, soda, and tea.  Follow diet instructions as told by your health care provider.  Exercise regularly as told by your health care provider.  Do not drink alcohol. General instructions  If you have obstructive sleep apnea, manage your condition as told by your health care provider.  Maintain a healthy weight. Do not use diet pills unless your health care provider approves. Diet pills may make heart problems worse.  Keep all follow-up visits as told by your health care provider. This is important. Contact a health care provider if you:  Notice a change in the rate, rhythm, or strength of your heartbeat.   Are taking an anticoagulant and you notice increased bruising.  Tire more easily when you exercise or exert yourself.  Have a sudden change in weight. Get help right away if you have:   Chest pain, abdominal pain, sweating, or weakness.  Difficulty breathing.  Blood in your vomit, stool (feces), or urine.  Any symptoms of a stroke. "BE FAST" is an easy way to remember the main warning signs of a stroke: ? B - Balance. Signs are dizziness, sudden trouble walking, or loss of balance. ? E - Eyes. Signs are trouble seeing or a sudden change in vision. ? F - Face. Signs are sudden weakness or numbness of the face, or the face or eyelid drooping on one side. ? A - Arms. Signs are weakness or numbness in an arm. This happens suddenly and usually on one side of the body. ? S - Speech. Signs are sudden trouble speaking, slurred speech, or trouble understanding what people say. ? T - Time. Time to call emergency services. Write down what time symptoms started.  Other signs of a stroke, such as: ? A sudden, severe headache with no known cause. ? Nausea or vomiting. ? Seizure. These symptoms may represent a serious problem that is an emergency. Do not wait to see if the symptoms will go away. Get medical help right away. Call your local emergency services (911 in the [place] Do not drive yourself to the hospital. Summary  Atrial fibrillation is a type of irregular or rapid heartbeat (arrhythmia).  Symptoms include a feeling that your heart is beating fast or irregularly. In some cases, you may not have symptoms.  The condition is treated with medicines to slow down the heart rate or bring the heart's rhythm back to normal. You may also need blood-thinning medicines to prevent blood clots.  Get help right away if you have symptoms or signs of a stroke. This information is not intended to replace advice given to you by your health care provider. Make sure you discuss any questions you have with  your health care provider. Document Released: 02/01/2005 Document Revised: 03/24/2017 Document Reviewed: 03/25/2017 Elsevier Patient Education  2020 [employer].

## 2018-08-17 NOTE — Telephone Encounter (Signed)
Pt called and stated that he would like a call back from the nurse. 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 Pt states that his current cardiologist is not in his network anymore. Pt states that he can see someone in the [facility] area. Pt is very nervous about this and would like a call back as soon as possible. Please advise.

## 2018-08-18 LAB — URINALYSIS, ROUTINE W REFLEX MICROSCOPIC
Bilirubin, UA: NEGATIVE
Ketones, UA: NEGATIVE
Leukocytes,UA: NEGATIVE
Nitrite, UA: NEGATIVE
Protein,UA: NEGATIVE
RBC, UA: NEGATIVE
Specific Gravity, UA: >=1.03 — AB (ref 1.005–1.030)
Urobilinogen, Ur: 0.2 mg/dL (ref 0.2–1.0)
pH, UA: 5 (ref 5.0–7.5)

## 2018-08-19 LAB — URINE CULTURE: Organism ID, Bacteria: NO GROWTH

## 2018-08-21 ENCOUNTER — Ambulatory Visit: Payer: BLUE CROSS/BLUE SHIELD | Admitting: Cardiovascular Disease

## 2018-08-21 NOTE — Telephone Encounter (Signed)
Seen by mclean She placed cardiology referral to [facility] and ordered urine studies ( negative for infection)

## 2018-08-30 ENCOUNTER — Other Ambulatory Visit: Payer: Self-pay

## 2018-08-30 ENCOUNTER — Encounter: Payer: Self-pay | Admitting: Urology

## 2018-08-30 ENCOUNTER — Telehealth: Payer: Self-pay | Admitting: Family

## 2018-08-30 ENCOUNTER — Telehealth: Payer: Self-pay

## 2018-08-30 ENCOUNTER — Ambulatory Visit (INDEPENDENT_AMBULATORY_CARE_PROVIDER_SITE_OTHER): Payer: BLUE CROSS/BLUE SHIELD | Admitting: Urology

## 2018-08-30 VITALS — BP 139/98 | HR 98 | Ht 72.0 in | Wt 198.0 lb

## 2018-08-30 DIAGNOSIS — N401 Enlarged prostate with lower urinary tract symptoms: Secondary | ICD-10-CM

## 2018-08-30 DIAGNOSIS — R3911 Hesitancy of micturition: Secondary | ICD-10-CM

## 2018-08-30 DIAGNOSIS — N138 Other obstructive and reflux uropathy: Secondary | ICD-10-CM

## 2018-08-30 DIAGNOSIS — E119 Type 2 diabetes mellitus without complications: Secondary | ICD-10-CM

## 2018-08-30 DIAGNOSIS — Z125 Encounter for screening for malignant neoplasm of prostate: Secondary | ICD-10-CM | POA: Diagnosis not present

## 2018-08-30 DIAGNOSIS — I10 Essential (primary) hypertension: Secondary | ICD-10-CM

## 2018-08-30 LAB — URINALYSIS, COMPLETE
Bilirubin, UA: NEGATIVE
Ketones, UA: NEGATIVE
Leukocytes,UA: NEGATIVE
Nitrite, UA: NEGATIVE
Protein,UA: NEGATIVE
Specific Gravity, UA: 1.02 (ref 1.005–1.030)
Urobilinogen, Ur: 0.2 mg/dL (ref 0.2–1.0)
pH, UA: 5 (ref 5.0–7.5)

## 2018-08-30 LAB — BLADDER SCAN AMB NON-IMAGING

## 2018-08-30 LAB — MICROSCOPIC EXAMINATION
Bacteria, UA: NONE SEEN
RBC, Urine: NONE SEEN /hpf (ref 0–2)
WBC, UA: NONE SEEN /hpf (ref 0–5)

## 2018-08-30 MED ORDER — TAMSULOSIN HCL 0.4 MG PO CAPS: 0.4000 mg | ORAL_CAPSULE | Freq: Every day | ORAL | 11 refills | Status: DC

## 2018-08-30 MED ORDER — METFORMIN HCL 500 MG PO TABS: 1000.0000 mg | ORAL_TABLET | Freq: Two times a day (BID) | ORAL | 1 refills | Status: DC

## 2018-08-30 MED ORDER — LISINOPRIL 20 MG PO TABS: 20.0000 mg | ORAL_TABLET | Freq: Every day | ORAL | 1 refills | Status: DC

## 2018-08-30 NOTE — Patient Instructions (Signed)
Benign Prostatic Hyperplasia  Benign prostatic hyperplasia (BPH) is an enlarged prostate gland that is caused by the normal aging process and not by cancer. The prostate is a walnut-sized gland that is involved in the production of semen. It is located in front of the rectum and below the bladder. The bladder stores urine and the urethra is the tube that carries the urine out of the body. The prostate may get bigger as a man gets older. An enlarged prostate can press on the urethra. This can make it harder to pass urine. The build-up of urine in the bladder can cause infection. Back pressure and infection may progress to bladder damage and kidney (renal) failure. What are the causes? This condition is part of a normal aging process. However, not all men develop problems from this condition. If the prostate enlarges away from the urethra, urine flow will not be blocked. If it enlarges toward the urethra and compresses it, there will be problems passing urine. What increases the risk? This condition is more likely to develop in men over the age of 50 years. What are the signs or symptoms? Symptoms of this condition include:  Getting up often during the night to urinate.  Needing to urinate frequently during the day.  Difficulty starting urine flow.  Decrease in size and strength of your urine stream.  Leaking (dribbling) after urinating.  Inability to pass urine. This needs immediate treatment.  Inability to completely empty your bladder.  Pain when you pass urine. This is more common if there is also an infection.  Urinary tract infection (UTI). How is this diagnosed? This condition is diagnosed based on your medical history, a physical exam, and your symptoms. Tests will also be done, such as:  A post-void bladder scan. This measures any amount of urine that may remain in your bladder after you finish urinating.  A digital rectal exam. In a rectal exam, your health care provider  checks your prostate by putting a lubricated, gloved finger into your rectum to feel the back of your prostate gland. This exam detects the size of your gland and any abnormal lumps or growths.  An exam of your urine (urinalysis).  A prostate specific antigen (PSA) screening. This is a blood test used to screen for prostate cancer.  An ultrasound. This test uses sound waves to electronically produce a picture of your prostate gland. Your health care provider may refer you to a specialist in kidney and prostate diseases (urologist). How is this treated? Once symptoms begin, your health care provider will monitor your condition (active surveillance or watchful waiting). Treatment for this condition will depend on the severity of your condition. Treatment may include:  Observation and yearly exams. This may be the only treatment needed if your condition and symptoms are mild.  Medicines to relieve your symptoms, including: ? Medicines to shrink the prostate. ? Medicines to relax the muscle of the prostate.  Surgery in severe cases. Surgery may include: ? Prostatectomy. In this procedure, the prostate tissue is removed completely through an open incision or with a laparoscope or robotics. ? Transurethral resection of the prostate (TURP). In this procedure, a tool is inserted through the opening at the tip of the penis (urethra). It is used to cut away tissue of the inner core of the prostate. The pieces are removed through the same opening of the penis. This removes the blockage. ? Transurethral incision (TUIP). In this procedure, small cuts are made in the prostate. This lessens   the prostate's pressure on the urethra. ? Transurethral microwave thermotherapy (TUMT). This procedure uses microwaves to create heat. The heat destroys and removes a small amount of prostate tissue. ? Transurethral needle ablation (TUNA). This procedure uses radio frequencies to destroy and remove a small amount of  prostate tissue. ? Interstitial laser coagulation ([place]). This procedure uses a laser to destroy and remove a small amount of prostate tissue. ? Transurethral electrovaporization (TUVP). This procedure uses electrodes to destroy and remove a small amount of prostate tissue. ? Prostatic urethral lift. This procedure inserts an implant to push the lobes of the prostate away from the urethra. Follow these instructions at home:  Take over-the-counter and prescription medicines only as told by your health care provider.  Monitor your symptoms for any changes. Contact your health care provider with any changes.  Avoid drinking large amounts of liquid before going to bed or out in public.  Avoid or reduce how much caffeine or alcohol you drink.  Give yourself time when you urinate.  Keep all follow-up visits as told by your health care provider. This is important. Contact a health care provider if:  You have unexplained back pain.  Your symptoms do not get better with treatment.  You develop side effects from the medicine you are taking.  Your urine becomes very dark or has a bad smell.  Your lower abdomen becomes distended and you have trouble passing your urine. Get help right away if:  You have a fever or chills.  You suddenly cannot urinate.  You feel lightheaded, or very dizzy, or you faint.  There are large amounts of blood or clots in the urine.  Your urinary problems become hard to manage.  You develop moderate to severe low back or flank pain. The flank is the side of your body between the ribs and the hip. These symptoms may represent a serious problem that is an emergency. Do not wait to see if the symptoms will go away. Get medical help right away. Call your local emergency services (911 in the [place] Do not drive yourself to the hospital. Summary  Benign prostatic hyperplasia (BPH) is an enlarged prostate that is caused by the normal aging process and not by cancer.   An enlarged prostate can press on the urethra. This can make it hard to pass urine.  This condition is part of a normal aging process and is more likely to develop in men over the age of 74 years.  Get help right away if you suddenly cannot urinate. This information is not intended to replace advice given to you by your health care provider. Make sure you discuss any questions you have with your health care provider. Document Released: 02/01/2005 Document Revised: 12/27/2017 Document Reviewed: 03/08/2016 Elsevier Patient Education  [employer].  Holmium Laser Enucleation of the Prostate (HoLEP)  HoLEP is a treatment for men with benign prostatic hyperplasia (BPH). The laser surgery removed blockages of urine flow, and is done without any incisions on the body.     What is HoLEP?  HoLEP is a type of laser surgery used to treat obstruction (blockage) of urine flow as a result of benign prostatic hyperplasia (BPH). In men with BPH, the prostate gland is not cancerous, but has become enlarged. An enlarged prostate can result in a number of urinary tract symptoms such as weak urinary stream, difficulty in starting urination, inability to urinate, frequent urination, or getting up at night to urinate.  HoLEP was developed in  the 1990's as a more effective and less expensive surgical option for BPH, compared to other surgical options such as laser vaporization(PVP/greenlight laser), transurethral resection of the prostate(TURP), and open simple prostatectomy.   What happens during a HoLEP?  HoLEP requires general anesthesia ("asleep" throughout the procedure).   An antibiotic is given to reduce the risk of infection  A surgical instrument called a resectoscope is inserted through the urethra (the tube that carries urine from the bladder). The resectoscope has a camera that allows the surgeon to view the internal structure of the prostate gland, and to see where the incisions are being  made during surgery.  The laser is inserted into the resectoscope and is used to enucleate (free up) the enlarged prostate tissue from the capsule (outer shell) and then to seal up any blood vessels. The tissue that has been removed is pushed back into the bladder.  A morcellator is placed through the resectoscope, and is used to suction out the prostate tissue that has been pushed into the bladder.  When the prostate tissue has been removed, the resectoscope is removed, and a foley catheter is placed to allow healing and drain the urine from the bladder.     What happens after a HoLEP?  More than 90% of patients go home the same day a few hours after surgery. Less than 10% will be admitted to the hospital overnight for observation to monitor the urine, or if they have other medical problems.  Fluid is flushed through the catheter for about 1 hour after surgery to clear any blood from the urine. It is normal to have some blood in the urine after surgery. The need for blood transfusion is extremely rare.  Eating and drinking are permitted after the procedure once the patient has fully awakened from anesthesia.  The catheter is usually removed 2-3 days after surgery- the patient will come to clinic to have the catheter removed and make sure they can urinate on their own.  It is very important to drink lots of fluids after surgery for one week to keep the bladder flushed.  At first, there may be some burning with urination, but this typically improved within a few hours to days. Most patients do not have a significant amount of pain, and narcotic pain medications are rarely needed.  Symptoms of urinary frequency, urgency, and even leakage are NORMAL for the first few weeks after surgery as the bladder adjusts after having to work hard against blockage from the prostate for many years. This will improve, but can sometimes take several months.  The use of pelvic floor exercises (Kegel exercises)  can help improve problems with urinary incontinence.   After catheter removal, patients will be seen at 6 weeks and 6 months for symptom check  No heavy lifting for at least 2-3 weeks after surgery, however patients can walk and do light activities the first day after surgery. Return to work time depends on occupation.    What are the advantages of HoLEP?  HoLEP has been studied in many different parts of the world and has been shown to be a safe and effective procedure. Although there are many types of BPH surgeries available, HoLEP offers a unique advantage in being able to remove a large amount of tissue without any incisions on the body, even in very large prostates, while decreasing the risk of bleeding and providing tissue for pathology (to look for cancer). This decreases the need for blood transfusions during surgery, minimizes   hospital stay, and reduces the risk of needing repeat treatment.  What are the side effects of HoLEP?  Temporary burning and bleeding during urination. Some blood may be seen in the urine for weeks after surgery and is part of the healing process.  Urinary incontinence (inability to control urine flow) is expected in all patients immediately after surgery and they should wear pads for the first few days/weeks. This typically improves over the course of several weeks. Performing Kegel exercises can help decrease leakage from stress maneuvers such as coughing, sneezing, or lifting. The rate of long term leakage is very low. Patients may also have leakage with urgency and this may be treated with medication. The risk of urge incontinence can be dependent on several factors including age, prostate size, symptoms, and other medical problems.  Retrograde ejaculation or "backwards ejaculation." In 75% of cases, the patient will not see any fluid during ejaculation after surgery.  Erectile function is generally not significantly affected.   What are the risks of HoLEP?   Injury to the urethra or development of scar tissue at a later date  Injury to the capsule of the prostate (typically treated with longer catheterization).  Injury to the bladder or ureteral orifices (where the urine from the kidney drains out)  Infection of the bladder, testes, or kidneys  Return of urinary obstruction at a later date requiring another operation (<2%)  Need for blood transfusion or re-operation due to bleeding  Failure to relieve all symptoms and/or need for prolonged catheterization after surgery  5-15% of patients are found to have previously undiagnosed prostate cancer in their specimen. Prostate cancer can be treated after HoLEP.  Standard risks of anesthesia including blood clots, heart attacks, etc  When should I call my doctor?  Fever over 101.3 degrees  Inability to urinate, or large blood clots in the urine     Prostate Cancer Screening  The prostate is a walnut-sized gland that is located below the bladder and in front of the rectum in males. The function of the prostate (prostate gland) is to add fluid to semen during ejaculation. Prostate cancer is the second most common type of cancer in men. A screening test for cancer is a test that is done before cancer symptoms start. Screening can help to identify cancer at an early stage, when the cancer can be treated more easily. The recommended prostate cancer screening test is a blood test called the prostate-specific antigen (PSA) test. PSA is a protein that is made in the prostate. As you age, your prostate naturally produces more PSA. Abnormally high PSA levels may be caused by:  Prostate cancer.  An enlarged prostate that is not caused by cancer (benign prostatic hyperplasia, BPH). This condition is very common in older men.  A prostate gland infection (prostatitis).  Medicines to assist with hair growth, such as finasteride. Depending on the PSA results, you may need more tests, such as:  A  physical exam to check the size of your prostate gland.  Blood and imaging tests.  A procedure to remove tissue samples from your prostate gland for testing (biopsy). Who should have screening? Screening recommendations vary based on age.  If you are younger than age 57, screening is not recommended.  If you are age 67-54 and you have no risk factors, screening is not recommended.  If you are younger than age 76, ask your health care provider if you need screening if you have one of these risk factors: ? Being  of African-American descent. ? Having a family history of prostate cancer.  If you are age 39-69, talk with your health care provider about your need for screening and how often screening should be done.  If you are older than age 18, screening is not recommended. This is because the risks that screening can cause are greater than the benefits that it may provide (risks outweigh the benefits). If you are at high risk for prostate cancer, your health care provider may recommend that you have screenings more often or start screening at a younger age. You may be at high risk if you:  Are older than age 72.  Are African-American.  Have a father, brother, or uncle who has been diagnosed with prostate cancer. The risk may be higher if your family member's cancer occurred at an early age. What are the benefits of screening? There is a small chance that screening may lower your risk of dying from prostate cancer. The chance is small because prostate cancer is typically a slow-growing cancer, and most men with prostate cancer die from a different cause. What are the risks of screening? The main risk of prostate cancer screening is diagnosing and treating prostate cancer that would never have caused any symptoms or problems (overdiagnosis and overtreatment). PSA screening cannot tell you if your PSA is high due to cancer or a different cause. A prostate biopsy is the only procedure to  diagnose prostate cancer. Even the results of a biopsy may not tell you if your cancer needs to be treated. Slow-growing prostate cancer may not need any treatment other than monitoring, so diagnosing and treating it may cause unnecessary stress or other side effects. A prostate biopsy may also cause:  Infection or fever.  A false negative. This is a result that shows that you do not have prostate cancer when you actually do have prostate cancer. Questions to ask your health care provider  When should I start prostate cancer screening?  What is my risk for prostate cancer?  How often do I need screening?  What type of screening tests do I need?  How do I get my test results?  What do my results mean?  Do I need treatment? Contact a health care provider if:  You have difficulty urinating.  You have pain when you urinate or ejaculate.  You have blood in your urine or semen.  You have pain in your back or in the area of your prostate.  You have trouble getting or maintaining an erection (erectile dysfunction, ED). Summary  Prostate cancer is a common type of cancer in men. The prostate (prostate gland) is located below the bladder and in front of the rectum. This gland adds fluid to semen during ejaculation.  Prostate cancer screening may identify cancer at an early stage, when the cancer can be treated more easily.  The prostate-specific antigen (PSA) test is the recommended screening test for prostate cancer.  Discuss the risks and benefits of prostate cancer screening with your health care provider. If you are age 49 or older, screening is likely to lead to more risks than benefits (risks outweigh the benefits). This information is not intended to replace advice given to you by your health care provider. Make sure you discuss any questions you have with your health care provider. Document Released: 11/12/2016 Document Revised: 01/14/2017 Document Reviewed: 11/12/2016  Elsevier Patient Education  2020 [employer].

## 2018-08-30 NOTE — Telephone Encounter (Signed)
Medication has been sent in. Patient is aware

## 2018-08-30 NOTE — Telephone Encounter (Signed)
Medication Refill - Medication:  metFORMIN (GLUCOPHAGE) 500 MG tablet lisinopril (PRINIVIL,ZESTRIL) 20 MG tablet  Has the patient contacted their pharmacy? Yes, patient advised to call. Patient states he is completely out and would like a call back to discuss a few things as well.   Preferred Pharmacy (with phone number or street name):  [facility] [address] [place] - [place] [phone] (Phone) [phone] (Fax)   Agent: Please be advised that RX refills may take up to 3 business days. We ask that you follow-up with your pharmacy.

## 2018-08-30 NOTE — Progress Notes (Addendum)
08/30/18 11:28 AM   Jeffrey Ortiz 05-20-1955 [id number]  Referring provider: [name], FNP [address] [place],  [place] [address]  CC: BPH and urinary symptoms  HPI: I saw Jeffrey Ortiz in urology clinic today in consultation from [name], NP for BPH and bothersome urinary symptoms.  He is a relatively healthy 63 year old male with past medical history notable for atrial flutter not on anticoagulation, sleep apnea, and well-controlled diabetes who presents with 6 months of bothersome urinary symptoms including weak stream, urinary dribbling, nocturia 3-4 times per night, and straining with urination.  He has not tried any medications for this.  There are no aggravating factors.  Urinalysis was checked with his primary and was benign, and urine culture showed no growth.  He denies any family history of prostate cancer.  He denies any gross hematuria or flank pain.  Severity is moderate.  Of note, he does have a history of a very mildly elevated PSA of 4.1 in May 2016.  It does not appear that this was ever worked up or repeated.  There is no prior cross-sectional imaging to evaluate prostate volume.  IPSS score today in clinic is 23, with quality of life terrible.  PVR is 53 cc.   PMH: Past Medical History:  Diagnosis Date  . Atrial flutter ([place])    a. 8.2017 s/p RFCA.  Marland Kitchen Chronic systolic CHF (congestive heart failure) ([place])    a. 09/2015 Echo: EF 40-45%, diff HK.  . Essential hypertension   . Hyperlipidemia   . Hypertension   . Overweight   . Sleep apnea    "did study 2 days ago; CPAP ordered" (09/26/2015)  . Type II diabetes mellitus ([place])     Surgical History: Past Surgical History:  Procedure Laterality Date  . COLONOSCOPY WITH PROPOFOL N/A 08/10/2018   Procedure: COLONOSCOPY WITH PROPOFOL;  Surgeon: Virgel Manifold, MD;  Location: [facility] ENDOSCOPY;  Service: Endoscopy;  Laterality: N/A;  . ELECTROPHYSIOLOGIC STUDY N/A 09/26/2015   Procedure:  A-Flutter;  Surgeon: Deboraha Sprang, MD;  Location: [facility] CV LAB;  Service: Cardiovascular;  Laterality: N/A;  . TEE WITHOUT CARDIOVERSION N/A 08/13/2015   Procedure: TRANSESOPHAGEAL ECHOCARDIOGRAM (TEE) and cardioversion;  Surgeon: Minna Merritts, MD;  Location: [facility] ORS;  Service: Cardiovascular;  Laterality: N/A;    Allergies: No Known Allergies  Family History: Family History  Problem Relation Age of Onset  . Heart attack Father        died in his 60's w/ cancer but dx CAD in his 69's.  . Cancer Father   . Other Mother        alive @ 55  . Heart attack Brother        died in early 1's.  . Diabetes Brother     Social History:  reports that he has never smoked. He has never used smokeless tobacco. He reports that he does not drink alcohol or use drugs.  ROS: Please see flowsheet from today's date for complete review of systems.  Physical Exam: BP (!) 139/98 (BP Location: Left Arm, Patient Position: Sitting)   Pulse 98   Ht 6' (1.829 m)   Wt 198 lb (89.8 kg)   BMI 26.85 kg/m    Constitutional:  Alert and oriented, No acute distress. Cardiovascular: No clubbing, cyanosis, or edema. Respiratory: Normal respiratory effort, no increased work of breathing. GI: Abdomen is soft, nontender, nondistended, no abdominal masses GU: No CVA tenderness, phallus without lesions, widely patent meatus  DRE: 70 g, smooth, no nodules or masses Lymph: No cervical or inguinal lymphadenopathy. Skin: No rashes, bruises or suspicious lesions. Neurologic: Grossly intact, no focal deficits, moving all 4 extremities. Psychiatric: Normal mood and affect.  Laboratory Data: Reviewed Urinalysis today 0 WBCs, 0 RBCs, no bacteria, nitrite negative PSA 06/2014: 4.1 -> never repeated or worked up  Pertinent Imaging: None to review  Assessment & Plan:   In summary, the patient is a relatively healthy 63 year old male with a 35-month history of bothersome urinary symptoms, primarily obstructive  in nature.  He also has a history of a mildly elevated PSA in 2016 of 4.1 that was never repeated or worked up.  DRE shows an enlarged prostate, but no masses or nodules.  We had a long discussion about BPH and treatment options, as well as PSA screening and prostate cancer.  -PSA today, will call with results, pursue prostate biopsy if elevated -Trial of Flomax 0.4 mg nightly for urinary symptoms, continue to use CPAP -RTC 3 months for symptom check, if ongoing bothersome symptoms consider evaluating prostate volume with TRUS and reviewing outlet procedures  ADDENDUM: PSA remains slightly elevated at 4.2, however with reassuring 23% free(equates to less than 10% chance of prostate cancer on biopsy).  We discussed risks and benefits of biopsy, and he would like to hold off at this time which is very reasonable.  He has already noted some improvement in his urination with Flomax.  Follow-up in October to rediscuss PSA and urinary symptoms.  In the future could consider either biopsy or prostate MRI to evaluate volume for potential outlet procedures as well as any malignant lesions.  [name], [facility] [address] [place], [place] [address] [phone]

## 2018-08-30 NOTE — Telephone Encounter (Signed)
Copied from [place] [id number]. Topic: General - Other >> Aug 30, 2018  3:27 PM Marin Olp L wrote: Reason for CRM: Patient would like a call back from NP Arnettes cma today. Says it's very important after hearing his refill request may take up to 3 business days.Declined to mention why he is requesting a call.

## 2018-08-30 NOTE — Telephone Encounter (Signed)
See other message

## 2018-08-31 LAB — FPSA% REFLEX
% FREE PSA: 22.9 %
PSA, FREE: 0.96 ng/mL

## 2018-08-31 LAB — PSA TOTAL (REFLEX TO FREE): Prostate Specific Ag, Serum: 4.2 ng/mL — ABNORMAL HIGH (ref 0.0–4.0)

## 2018-09-18 NOTE — Progress Notes (Signed)
Contacted patient at providers request regarding experience with our front office on 08/17/2018. Patient did not answer no answering machine picked up.

## 2018-10-19 ENCOUNTER — Telehealth: Payer: Self-pay | Admitting: Family

## 2018-10-19 NOTE — Telephone Encounter (Signed)
Call pt We received a copy of his echocardiogram. Please send a copy to him so he can take with him to his appointments.    I know he had seen my colleague, Dr Aundra Dubin a month ago. Per her note, my understanding is that he needed to establish with [facility] and [facility] cardiology.  Has he established with them thus far?  I do not see any notes of this.  He certainly needs to see cardiology for his history of a flutter and his decreased ejection fraction on echocardiogram. He also has establish with a primary at [facility] which is my understanding.  Please asked if he is done this yet. Please ensure he does not need anything else from [place]. I just want to ensure that he is safe and that all these components are in place

## 2018-10-20 NOTE — Telephone Encounter (Signed)
LMTCB to see if patient needed copy of echo & see if he has appointments on thr books for [facility].

## 2018-10-30 NOTE — Telephone Encounter (Signed)
Patient returned call. Unable to reach [facility]. Patient stated that he does not feel he would need any of that information at this time but will call back if needed in the future.

## 2018-10-30 NOTE — Telephone Encounter (Signed)
LMTCB. I have printed and sent letter asking him to call our office.

## 2018-10-30 NOTE — Telephone Encounter (Signed)
Please call again; if no answer may mail letter to ask him call office and let us know if he needed copies of any of his health information including recent echocardiogram prior to establishing with [facility].

## 2018-10-30 NOTE — Telephone Encounter (Signed)
FYI. Noted

## 2018-10-30 NOTE — Telephone Encounter (Signed)
noted 

## 2018-12-07 ENCOUNTER — Ambulatory Visit: Payer: BLUE CROSS/BLUE SHIELD | Admitting: Urology

## 2018-12-07 ENCOUNTER — Encounter: Payer: Self-pay | Admitting: Urology

## 2019-01-22 ENCOUNTER — Ambulatory Visit: Payer: BLUE CROSS/BLUE SHIELD | Admitting: Urology

## 2019-03-01 ENCOUNTER — Ambulatory Visit: Payer: BLUE CROSS/BLUE SHIELD | Admitting: Urology

## 2019-03-14 ENCOUNTER — Other Ambulatory Visit: Payer: Self-pay

## 2019-03-14 ENCOUNTER — Encounter: Payer: Self-pay | Admitting: Urology

## 2019-03-14 ENCOUNTER — Ambulatory Visit (INDEPENDENT_AMBULATORY_CARE_PROVIDER_SITE_OTHER): Payer: BLUE CROSS/BLUE SHIELD | Admitting: Urology

## 2019-03-14 VITALS — BP 167/80 | HR 75 | Ht 72.0 in | Wt 205.0 lb

## 2019-03-14 DIAGNOSIS — Z125 Encounter for screening for malignant neoplasm of prostate: Secondary | ICD-10-CM

## 2019-03-14 DIAGNOSIS — N401 Enlarged prostate with lower urinary tract symptoms: Secondary | ICD-10-CM

## 2019-03-14 DIAGNOSIS — N138 Other obstructive and reflux uropathy: Secondary | ICD-10-CM

## 2019-03-14 LAB — BLADDER SCAN AMB NON-IMAGING

## 2019-03-14 MED ORDER — TAMSULOSIN HCL 0.4 MG PO CAPS: 0.4000 mg | ORAL_CAPSULE | Freq: Every day | ORAL | 3 refills | Status: DC

## 2019-03-14 NOTE — Progress Notes (Signed)
   03/14/2019 3:39 PM   [name] 1955-05-29 [id number]  Reason for visit: Follow up BPH and PSA screening  HPI: I saw Jeffrey Ortiz back in urology clinic for follow-up of BPH and PSA screening.  He is a relatively healthy 64 year old male with a history of diabetes, atrial flutter, and sleep apnea on CPAP who I originally saw in July 2020 for worsening urinary symptoms of weak stream, urinary dribbling, nocturia 3-4 times per night.  We started Flomax at that time which almost completely resolved his urinary symptoms.  He is currently doing very well and is very pleased with his urination at this time.  Bladder scan in clinic was 150 mL today, however he has not voided since lunchtime.  We discussed outlet procedures as an alternative option for his urinary symptoms, but he is satisfied on Flomax at this time.  Regarding his PSA screening, he has a history of a very mildly elevated PSA of 4.1 in May 2016 that was never evaluated further.  Recent PSA in July 2020 was stable at 4.2 with reassuring 23% free.  He elected to defer prostate biopsy which is very reasonable.  DRE in July showed a 70 g prostate with no abnormalities.  We reviewed the AUA guidelines that recommend routine PSA screening for men age 75-69.  We discussed the risks and benefits at length.  Continue Flomax RTC 1 year for PVR and PSA  A total of 20 minutes were spent face-to-face with the patient, greater than 50% was spent in patient education, counseling, and coordination of care regarding BPH and PSA screening.  [name], [facility] [address] [place],  [address] [phone]

## 2019-03-14 NOTE — Patient Instructions (Signed)

## 2019-11-05 ENCOUNTER — Other Ambulatory Visit: Payer: Self-pay | Admitting: Family

## 2019-11-05 DIAGNOSIS — E119 Type 2 diabetes mellitus without complications: Secondary | ICD-10-CM

## 2020-02-01 ENCOUNTER — Ambulatory Visit: Payer: 59 | Admitting: Family

## 2020-02-01 ENCOUNTER — Encounter: Payer: Self-pay | Admitting: Family

## 2020-02-01 ENCOUNTER — Other Ambulatory Visit: Payer: Self-pay

## 2020-02-01 VITALS — BP 134/82 | HR 59 | Temp 97.7°F | Ht 72.0 in | Wt 194.4 lb

## 2020-02-01 DIAGNOSIS — E785 Hyperlipidemia, unspecified: Secondary | ICD-10-CM | POA: Diagnosis not present

## 2020-02-01 DIAGNOSIS — I10 Essential (primary) hypertension: Secondary | ICD-10-CM | POA: Diagnosis not present

## 2020-02-01 DIAGNOSIS — I480 Paroxysmal atrial fibrillation: Secondary | ICD-10-CM

## 2020-02-01 DIAGNOSIS — E119 Type 2 diabetes mellitus without complications: Secondary | ICD-10-CM | POA: Diagnosis not present

## 2020-02-01 NOTE — Patient Instructions (Signed)
It is imperative that you are seen AT least twice per year for labs and monitoring. Monitor blood pressure at home and me 5-6 reading on separate days. Goal is less than 120/80, based on newest guidelines, however we certainly want to be less than 130/80;  if persistently higher, please make sooner follow up appointment so we can recheck you blood pressure and manage/ adjust medications.  Nice to see you!   

## 2020-02-01 NOTE — Progress Notes (Signed)
Subjective:    Patient ID: Jeffrey Ortiz, male    DOB: 1955-03-13, 64 y.o.   MRN: [id number]  CC: Jeffrey Ortiz is a 64 y.o. male who presents today for follow up.   HPI: Feels well today No complaints  DM- working on diet and weight loss. compliant with jardiance 10mg , metformin 500 mg BID  HTN- compliant with lopressor 25mg  BID, lisinopril 40mg . No cp, palpitations, sob.  At home BP around 135-145/85.  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 10,000 to 15,000 steps per day.   No  Longer on ASA or Eliquis; follows with Jeffrey Ortiz, Cardiology  BPH- follows with Jeffrey Ortiz     HISTORY:  Past Medical History:  Diagnosis Date  . Atrial flutter ([place])    a. 8.2017 s/p RFCA.  Marland Kitchen Chronic systolic CHF (congestive heart failure) ([place])    a. 09/2015 Echo: EF 40-45%, diff HK.  . Essential hypertension   . Hyperlipidemia   . Hypertension   . Overweight   . Sleep apnea    "did study 2 days ago; CPAP ordered" (09/26/2015)  . Type II diabetes mellitus ([place])    Past Surgical History:  Procedure Laterality Date  . COLONOSCOPY WITH PROPOFOL N/A 08/10/2018   Procedure: COLONOSCOPY WITH PROPOFOL;  Surgeon: Jeffrey Manifold, MD;  Location: [facility] ENDOSCOPY;  Service: Endoscopy;  Laterality: N/A;  . ELECTROPHYSIOLOGIC STUDY N/A 09/26/2015   Procedure: A-Flutter;  Surgeon: Jeffrey Sprang, MD;  Location: [facility] CV LAB;  Service: Cardiovascular;  Laterality: N/A;  . TEE WITHOUT CARDIOVERSION N/A 08/13/2015   Procedure: TRANSESOPHAGEAL ECHOCARDIOGRAM (TEE) and cardioversion;  Surgeon: Jeffrey Merritts, MD;  Location: [facility] ORS;  Service: Cardiovascular;  Laterality: N/A;   Family History  Problem Relation Age of Onset  . Heart attack Father        died in his 31's w/ cancer but dx CAD in his 48's.  . Cancer Father   . Other Mother        alive @ 46  . Heart attack Brother        died in early 69's.  . Diabetes Brother     Allergies: Patient has no known allergies. Current Outpatient  Medications on File Prior to Visit  Medication Sig Dispense Refill  . JARDIANCE 10 MG TABS tablet Take 1 tablet by mouth once daily 90 tablet 0  . lisinopril (ZESTRIL) 40 MG tablet Take 40 mg by mouth daily.    . metFORMIN (GLUCOPHAGE) 500 MG tablet Take 2 tablets by mouth twice daily 360 tablet 0  . metoprolol tartrate (LOPRESSOR) 25 MG tablet Take 1 tablet (25 mg total) by mouth 2 (two) times daily. 60 tablet 2  . tamsulosin (FLOMAX) 0.4 MG CAPS capsule Take 1 capsule (0.4 mg total) by mouth daily. 90 capsule 3   No current facility-administered medications on file prior to visit.    Social History   Tobacco Use  . Smoking status: Never Smoker  . Smokeless tobacco: Never Used  Vaping Use  . Vaping Use: Never used  Substance Use Topics  . Alcohol use: No    Alcohol/week: 0.0 standard drinks  . Drug use: No    Comment: Used marijuana a few times in college.    Review of Systems  Constitutional: Negative for chills and fever.  HENT: Negative for congestion, ear pain, rhinorrhea, sinus pressure and sore throat.   Respiratory: Negative for cough, shortness of breath and wheezing.   Cardiovascular: Negative for chest pain and  palpitations.  Gastrointestinal: Negative for diarrhea, nausea and vomiting.  Genitourinary: Negative for dysuria.  Musculoskeletal: Negative for myalgias.  Skin: Negative for rash.  Neurological: Negative for headaches.  Hematological: Negative for adenopathy.      Objective:    BP 134/82   Pulse (!) 59   Temp 97.7 F (36.5 C)   Ht 6' (1.829 m)   Wt 194 lb 6.4 oz (88.2 kg)   SpO2 98%   BMI 26.37 kg/m  BP Readings from Last 3 Encounters:  02/01/20 134/82  03/14/19 (!) 167/80  08/30/18 (!) 139/98   Wt Readings from Last 3 Encounters:  02/01/20 194 lb 6.4 oz (88.2 kg)  03/14/19 205 lb (93 kg)  08/30/18 198 lb (89.8 kg)    Physical Exam Vitals reviewed.  Constitutional:      Appearance: He is well-developed and well-nourished.   Cardiovascular:     Rate and Rhythm: Regular rhythm.     Heart sounds: Normal heart sounds.  Pulmonary:     Effort: Pulmonary effort is normal. No respiratory distress.     Breath sounds: Normal breath sounds. No wheezing, rhonchi or rales.  Skin:    General: Skin is warm and dry.  Neurological:     Mental Status: He is alert.  Psychiatric:        Mood and Affect: Mood and affect normal.        Speech: Speech normal.        Behavior: Behavior normal.        Assessment & Plan:   Problem List Items Addressed This Visit      Cardiovascular and Mediastinum   Essential hypertension - Primary (Chronic)    Slightly elevated. Patient will keep blood pressure at home and call with readings. Continue lopressor 25mg  BID, lisinopril 40mg       Relevant Orders   TSH (Completed)   CBC with Differential/Platelet (Completed)   Comprehensive metabolic panel (Completed)   Hemoglobin A1c (Completed)   Lipid panel (Completed)   Microalbumin / creatinine urine ratio (Completed)   Paroxysmal atrial fibrillation (HCC)    Asymptomatic. Off anticoagulants ASA and Eliquis. Follows with Jeffrey Ortiz        Endocrine   DM (diabetes mellitus), type 2 [facility])    Lab Results  Component Value Date   HGBA1C 6.3 (H) 02/01/2020    Controlled. Continue jardiance 10mg , metformin 500 mg BID         Other   Hyperlipidemia    Uncontrolled. Advised statin therapy for goal < 70          I have discontinued Jeffrey Ortiz's rosuvastatin, ezetimibe, hydrochlorothiazide, aspirin EC, and apixaban. I am also having him maintain his metoprolol tartrate, lisinopril, tamsulosin, metFORMIN, and Jardiance.   No orders of the defined types were placed in this encounter.   Return precautions given.   Risks, benefits, and alternatives of the medications and treatment plan prescribed today were discussed, and patient expressed understanding.   Education regarding symptom management and diagnosis given to  patient on AVS.  Continue to follow with [name], FNP for routine health maintenance.   Jeffrey Ortiz and I agreed with plan.   Jeffrey Paris, FNP

## 2020-02-02 LAB — COMPREHENSIVE METABOLIC PANEL WITH GFR
AG Ratio: 1.9 (calc) (ref 1.0–2.5)
ALT: 16 U/L (ref 9–46)
AST: 18 U/L (ref 10–35)
Albumin: 4.1 g/dL (ref 3.6–5.1)
Alkaline phosphatase (APISO): 76 U/L (ref 35–144)
BUN: 20 mg/dL (ref 7–25)
CO2: 22 mmol/L (ref 20–32)
Calcium: 9.2 mg/dL (ref 8.6–10.3)
Chloride: 105 mmol/L (ref 98–110)
Creat: 0.95 mg/dL (ref 0.70–1.25)
Globulin: 2.2 g/dL (ref 1.9–3.7)
Glucose, Bld: 84 mg/dL (ref 65–99)
Potassium: 4.5 mmol/L (ref 3.5–5.3)
Sodium: 137 mmol/L (ref 135–146)
Total Bilirubin: 0.7 mg/dL (ref 0.2–1.2)
Total Protein: 6.3 g/dL (ref 6.1–8.1)

## 2020-02-02 LAB — CBC WITH DIFFERENTIAL/PLATELET
Absolute Monocytes: 774 {cells}/uL (ref 200–950)
Basophils Absolute: 27 {cells}/uL (ref 0–200)
Basophils Relative: 0.3 %
Eosinophils Absolute: 100 {cells}/uL (ref 15–500)
Eosinophils Relative: 1.1 %
HCT: 47.4 % (ref 38.5–50.0)
Hemoglobin: 16.4 g/dL (ref 13.2–17.1)
Lymphs Abs: 1966 {cells}/uL (ref 850–3900)
MCH: 32.7 pg (ref 27.0–33.0)
MCHC: 34.6 g/dL (ref 32.0–36.0)
MCV: 94.6 fL (ref 80.0–100.0)
MPV: 11.4 fL (ref 7.5–12.5)
Monocytes Relative: 8.5 %
Neutro Abs: 6234 {cells}/uL (ref 1500–7800)
Neutrophils Relative %: 68.5 %
Platelets: 255 Thousand/uL (ref 140–400)
RBC: 5.01 Million/uL (ref 4.20–5.80)
RDW: 12.4 % (ref 11.0–15.0)
Total Lymphocyte: 21.6 %
WBC: 9.1 Thousand/uL (ref 3.8–10.8)

## 2020-02-02 LAB — MICROALBUMIN / CREATININE URINE RATIO
Creatinine, Urine: 76 mg/dL (ref 20–320)
Microalb Creat Ratio: 5 mcg/mg creat (ref <30)
Microalb, Ur: 0.4 mg/dL

## 2020-02-02 LAB — LIPID PANEL
Cholesterol: 186 mg/dL (ref <200)
HDL: 45 mg/dL (ref >=40)
LDL Cholesterol (Calc): 120 mg/dL (calc) — ABNORMAL HIGH
Non-HDL Cholesterol (Calc): 141 mg/dL (calc) — ABNORMAL HIGH (ref <130)
Total CHOL/HDL Ratio: 4.1 (calc) (ref <5.0)
Triglycerides: 104 mg/dL (ref <150)

## 2020-02-02 LAB — HEMOGLOBIN A1C
Hgb A1c MFr Bld: 6.3 %{Hb} — ABNORMAL HIGH (ref <5.7)
Mean Plasma Glucose: 134 mg/dL
eAG (mmol/L): 7.4 mmol/L

## 2020-02-02 LAB — TSH: TSH: 0.5 m[IU]/L (ref 0.40–4.50)

## 2020-02-04 ENCOUNTER — Other Ambulatory Visit: Payer: Self-pay

## 2020-02-04 DIAGNOSIS — E785 Hyperlipidemia, unspecified: Secondary | ICD-10-CM

## 2020-02-04 MED ORDER — ROSUVASTATIN CALCIUM 10 MG PO TABS: 10.0000 mg | ORAL_TABLET | Freq: Every day | ORAL | 3 refills | Status: DC

## 2020-02-04 NOTE — Assessment & Plan Note (Signed)
Lab Results  Component Value Date   HGBA1C 6.3 (H) 02/01/2020    Controlled. Continue jardiance 10mg , metformin 500 mg BID

## 2020-02-04 NOTE — Assessment & Plan Note (Signed)
Asymptomatic. Off anticoagulants ASA and Eliquis. Follows with Dr Luana Shu

## 2020-02-04 NOTE — Assessment & Plan Note (Addendum)
Slightly elevated. Patient will keep blood pressure at home and call with readings. Continue lopressor 25mg  BID, lisinopril 40mg 

## 2020-02-04 NOTE — Assessment & Plan Note (Signed)
Uncontrolled. Advised statin therapy for goal < 70

## 2020-03-06 ENCOUNTER — Other Ambulatory Visit: Payer: Medicaid Other

## 2020-03-06 ENCOUNTER — Other Ambulatory Visit: Payer: BLUE CROSS/BLUE SHIELD

## 2020-03-06 ENCOUNTER — Other Ambulatory Visit: Payer: Self-pay

## 2020-03-06 DIAGNOSIS — N138 Other obstructive and reflux uropathy: Secondary | ICD-10-CM

## 2020-03-07 LAB — PSA: Prostate Specific Ag, Serum: 4.8 ng/mL — ABNORMAL HIGH (ref 0.0–4.0)

## 2020-03-09 ENCOUNTER — Other Ambulatory Visit: Payer: Self-pay | Admitting: Family

## 2020-03-09 DIAGNOSIS — E119 Type 2 diabetes mellitus without complications: Secondary | ICD-10-CM

## 2020-03-10 ENCOUNTER — Telehealth: Payer: Self-pay | Admitting: Family

## 2020-03-10 MED ORDER — METFORMIN HCL 500 MG PO TABS: 1000.0000 mg | ORAL_TABLET | Freq: Two times a day (BID) | ORAL | 0 refills | Status: DC

## 2020-03-10 NOTE — Telephone Encounter (Signed)
Patient is requesting a refill on his metFORMIN (GLUCOPHAGE) 500 MG tablet. Patient is out of his medication.

## 2020-03-13 ENCOUNTER — Other Ambulatory Visit: Payer: Self-pay

## 2020-03-13 ENCOUNTER — Ambulatory Visit (INDEPENDENT_AMBULATORY_CARE_PROVIDER_SITE_OTHER): Payer: 59 | Admitting: Urology

## 2020-03-13 ENCOUNTER — Ambulatory Visit: Payer: BLUE CROSS/BLUE SHIELD | Admitting: Urology

## 2020-03-13 ENCOUNTER — Encounter: Payer: Self-pay | Admitting: Urology

## 2020-03-13 VITALS — BP 160/86 | HR 63 | Ht 72.0 in | Wt 187.0 lb

## 2020-03-13 DIAGNOSIS — N401 Enlarged prostate with lower urinary tract symptoms: Secondary | ICD-10-CM | POA: Diagnosis not present

## 2020-03-13 DIAGNOSIS — Z125 Encounter for screening for malignant neoplasm of prostate: Secondary | ICD-10-CM | POA: Diagnosis not present

## 2020-03-13 DIAGNOSIS — N138 Other obstructive and reflux uropathy: Secondary | ICD-10-CM | POA: Diagnosis not present

## 2020-03-13 LAB — BLADDER SCAN AMB NON-IMAGING

## 2020-03-13 MED ORDER — TAMSULOSIN HCL 0.4 MG PO CAPS: 0.4000 mg | ORAL_CAPSULE | Freq: Every day | ORAL | 3 refills | Status: DC

## 2020-03-13 NOTE — Patient Instructions (Signed)
Benign Prostatic Hyperplasia  Benign prostatic hyperplasia (BPH) is an enlarged prostate gland that is caused by the normal aging process and not by cancer. The prostate is a walnut-sized gland that is involved in the production of semen. It is located in front of the rectum and below the bladder. The bladder stores urine and the urethra is the tube that carries the urine out of the body. The prostate may get bigger as a man gets older. An enlarged prostate can press on the urethra. This can make it harder to pass urine. The build-up of urine in the bladder can cause infection. Back pressure and infection may progress to bladder damage and kidney (renal) failure. What are the causes? This condition is part of a normal aging process. However, not all men develop problems from this condition. If the prostate enlarges away from the urethra, urine flow will not be blocked. If it enlarges toward the urethra and compresses it, there will be problems passing urine. What increases the risk? This condition is more likely to develop in men over the age of 50 years. What are the signs or symptoms? Symptoms of this condition include:  Getting up often during the night to urinate.  Needing to urinate frequently during the day.  Difficulty starting urine flow.  Decrease in size and strength of your urine stream.  Leaking (dribbling) after urinating.  Inability to pass urine. This needs immediate treatment.  Inability to completely empty your bladder.  Pain when you pass urine. This is more common if there is also an infection.  Urinary tract infection (UTI). How is this diagnosed? This condition is diagnosed based on your medical history, a physical exam, and your symptoms. Tests will also be done, such as:  A post-void bladder scan. This measures any amount of urine that may remain in your bladder after you finish urinating.  A digital rectal exam. In a rectal exam, your health care provider  checks your prostate by putting a lubricated, gloved finger into your rectum to feel the back of your prostate gland. This exam detects the size of your gland and any abnormal lumps or growths.  An exam of your urine (urinalysis).  A prostate specific antigen (PSA) screening. This is a blood test used to screen for prostate cancer.  An ultrasound. This test uses sound waves to electronically produce a picture of your prostate gland. Your health care provider may refer you to a specialist in kidney and prostate diseases (urologist). How is this treated? Once symptoms begin, your health care provider will monitor your condition (active surveillance or watchful waiting). Treatment for this condition will depend on the severity of your condition. Treatment may include:  Observation and yearly exams. This may be the only treatment needed if your condition and symptoms are mild.  Medicines to relieve your symptoms, including: ? Medicines to shrink the prostate. ? Medicines to relax the muscle of the prostate.  Surgery in severe cases. Surgery may include: ? Prostatectomy. In this procedure, the prostate tissue is removed completely through an open incision or with a laparoscope or robotics. ? Transurethral resection of the prostate (TURP). In this procedure, a tool is inserted through the opening at the tip of the penis (urethra). It is used to cut away tissue of the inner core of the prostate. The pieces are removed through the same opening of the penis. This removes the blockage. ? Transurethral incision (TUIP). In this procedure, small cuts are made in the prostate. This lessens   the prostate's pressure on the urethra. ? Transurethral microwave thermotherapy (TUMT). This procedure uses microwaves to create heat. The heat destroys and removes a small amount of prostate tissue. ? Transurethral needle ablation (TUNA). This procedure uses radio frequencies to destroy and remove a small amount of  prostate tissue. ? Interstitial laser coagulation (ILC). This procedure uses a laser to destroy and remove a small amount of prostate tissue. ? Transurethral electrovaporization (TUVP). This procedure uses electrodes to destroy and remove a small amount of prostate tissue. ? Prostatic urethral lift. This procedure inserts an implant to push the lobes of the prostate away from the urethra. Follow these instructions at home:  Take over-the-counter and prescription medicines only as told by your health care provider.  Monitor your symptoms for any changes. Contact your health care provider with any changes.  Avoid drinking large amounts of liquid before going to bed or out in public.  Avoid or reduce how much caffeine or alcohol you drink.  Give yourself time when you urinate.  Keep all follow-up visits as told by your health care provider. This is important. Contact a health care provider if:  You have unexplained back pain.  Your symptoms do not get better with treatment.  You develop side effects from the medicine you are taking.  Your urine becomes very dark or has a bad smell.  Your lower abdomen becomes distended and you have trouble passing your urine. Get help right away if:  You have a fever or chills.  You suddenly cannot urinate.  You feel lightheaded, or very dizzy, or you faint.  There are large amounts of blood or clots in the urine.  Your urinary problems become hard to manage.  You develop moderate to severe low back or flank pain. The flank is the side of your body between the ribs and the hip. These symptoms may represent a serious problem that is an emergency. Do not wait to see if the symptoms will go away. Get medical help right away. Call your local emergency services (911 in the [place] Do not drive yourself to the hospital. Summary  Benign prostatic hyperplasia (BPH) is an enlarged prostate that is caused by the normal aging process and not by  cancer.  An enlarged prostate can press on the urethra. This can make it hard to pass urine.  This condition is part of a normal aging process and is more likely to develop in men over the age of 50 years.  Get help right away if you suddenly cannot urinate. This information is not intended to replace advice given to you by your health care provider. Make sure you discuss any questions you have with your health care provider. Document Revised: 10/11/2019 Document Reviewed: 10/11/2019 Elsevier Patient Education  2021 Elsevier Inc.  --------------------------------------------------------------------------  Holmium Laser Enucleation of the Prostate (HoLEP)  HoLEP is a treatment for men with benign prostatic hyperplasia (BPH). The laser surgery removed blockages of urine flow, and is done without any incisions on the body.     What is HoLEP?  HoLEP is a type of laser surgery used to treat obstruction (blockage) of urine flow as a result of benign prostatic hyperplasia (BPH). In men with BPH, the prostate gland is not cancerous, but has become enlarged. An enlarged prostate can result in a number of urinary tract symptoms such as weak urinary stream, difficulty in starting urination, inability to urinate, frequent urination, or getting up at night to urinate.  HoLEP was developed in the   1990's as a more effective and less expensive surgical option for BPH, compared to other surgical options such as laser vaporization(PVP/greenlight laser), transurethral resection of the prostate(TURP), and open simple prostatectomy.   What happens during a HoLEP?  HoLEP requires general anesthesia ("asleep" throughout the procedure).   An antibiotic is given to reduce the risk of infection  A surgical instrument called a resectoscope is inserted through the urethra (the tube that carries urine from the bladder). The resectoscope has a camera that allows the surgeon to view the internal structure of the  prostate gland, and to see where the incisions are being made during surgery.  The laser is inserted into the resectoscope and is used to enucleate (free up) the enlarged prostate tissue from the capsule (outer shell) and then to seal up any blood vessels. The tissue that has been removed is pushed back into the bladder.  A morcellator is placed through the resectoscope, and is used to suction out the prostate tissue that has been pushed into the bladder.  When the prostate tissue has been removed, the resectoscope is removed, and a foley catheter is placed to allow healing and drain the urine from the bladder.     What happens after a HoLEP?  More than 90% of patients go home the same day a few hours after surgery. Less than 10% will be admitted to the hospital overnight for observation to monitor the urine, or if they have other medical problems.  Fluid is flushed through the catheter for about 1 hour after surgery to clear any blood from the urine. It is normal to have some blood in the urine after surgery. The need for blood transfusion is extremely rare.  Eating and drinking are permitted after the procedure once the patient has fully awakened from anesthesia.  The catheter is usually removed 2-3 days after surgery- the patient will come to clinic to have the catheter removed and make sure they can urinate on their own.  It is very important to drink lots of fluids after surgery for one week to keep the bladder flushed.  At first, there may be some burning with urination, but this typically improved within a few hours to days. Most patients do not have a significant amount of pain, and narcotic pain medications are rarely needed.  Symptoms of urinary frequency, urgency, and even leakage are NORMAL for the first few weeks after surgery as the bladder adjusts after having to work hard against blockage from the prostate for many years. This will improve, but can sometimes take several  months.  The use of pelvic floor exercises (Kegel exercises) can help improve problems with urinary incontinence.   After catheter removal, patients will be seen at 6 weeks and 6 months for symptom check  No heavy lifting for at least 2-3 weeks after surgery, however patients can walk and do light activities the first day after surgery. Return to work time depends on occupation.    What are the advantages of HoLEP?  HoLEP has been studied in many different parts of the world and has been shown to be a safe and effective procedure. Although there are many types of BPH surgeries available, HoLEP offers a unique advantage in being able to remove a large amount of tissue without any incisions on the body, even in very large prostates, while decreasing the risk of bleeding and providing tissue for pathology (to look for cancer). This decreases the need for blood transfusions during surgery, minimizes hospital   stay, and reduces the risk of needing repeat treatment.  What are the side effects of HoLEP?  Temporary burning and bleeding during urination. Some blood may be seen in the urine for weeks after surgery and is part of the healing process.  Urinary incontinence (inability to control urine flow) is expected in all patients immediately after surgery and they should wear pads for the first few days/weeks. This typically improves over the course of several weeks. Performing Kegel exercises can help decrease leakage from stress maneuvers such as coughing, sneezing, or lifting. The rate of long term leakage is very low. Patients may also have leakage with urgency and this may be treated with medication. The risk of urge incontinence can be dependent on several factors including age, prostate size, symptoms, and other medical problems.  Retrograde ejaculation or "backwards ejaculation." In 75% of cases, the patient will not see any fluid during ejaculation after surgery.  Erectile function is generally  not significantly affected.   What are the risks of HoLEP?  Injury to the urethra or development of scar tissue at a later date  Injury to the capsule of the prostate (typically treated with longer catheterization).  Injury to the bladder or ureteral orifices (where the urine from the kidney drains out)  Infection of the bladder, testes, or kidneys  Return of urinary obstruction at a later date requiring another operation (<2%)  Need for blood transfusion or re-operation due to bleeding  Failure to relieve all symptoms and/or need for prolonged catheterization after surgery  5-15% of patients are found to have previously undiagnosed prostate cancer in their specimen. Prostate cancer can be treated after HoLEP.  Standard risks of anesthesia including blood clots, heart attacks, etc  When should I call my doctor?  Fever over 101.3 degrees  Inability to urinate, or large blood clots in the urine   

## 2020-03-13 NOTE — Progress Notes (Signed)
   03/13/2020 5:36 PM   Jeffrey Ortiz 01/22/1956 [id number]  Reason for visit: Follow up BPH, PSA screening  HPI: I saw Jeffrey Ortiz back for the above issues.  He is a relatively healthy 65 year old male with a long history of very mildly elevated PSA.  PSA was 4.1 as far back as May 2016, and has been stable since that time.  PSA in July 2020 was stable at 4.2 with reassuring 23% free.  DRE has shown a large 80 g prostate with no nodules or masses.  Highly suspect his PSA mild elevation is secondary to BPH and enlarged prostate.  PSA today has increased slightly to 4.8.  DRE is again benign today.  We reviewed options for his mildly elevated PSA including repeat PSA, prostate MRI, or prostate biopsy.  He would like to hold off at this time with any further evaluation of the PSA which I think is reasonable based on the stability over the last 5 years, and enlarged prostate on DRE.  He is continued to have urinary symptoms of weak stream and feeling of incomplete emptying.  IPSS score today is nine, with quality of life mostly satisfied, but PVR is again mildly elevated at 210 mL.  He remains on Flomax.  We discussed options at length including addition of finasteride versus further evaluation with cystoscopy and TRUS.  With his worsening urinary symptoms he would like to pursue further evaluation and consider outlet procedures.  We briefly reviewed UroLift versus HOLEP.  RTC for cystoscopy and TRUS for further evaluation of BPH and consideration of outlet procedures   [name], MD  [facility] [address], [place], [place] [address] [phone]

## 2020-03-19 ENCOUNTER — Other Ambulatory Visit: Payer: Self-pay

## 2020-03-19 ENCOUNTER — Other Ambulatory Visit (INDEPENDENT_AMBULATORY_CARE_PROVIDER_SITE_OTHER): Payer: 59

## 2020-03-19 DIAGNOSIS — E785 Hyperlipidemia, unspecified: Secondary | ICD-10-CM | POA: Diagnosis not present

## 2020-03-20 LAB — COMPREHENSIVE METABOLIC PANEL
ALT: 18 U/L (ref 0–53)
AST: 22 U/L (ref 0–37)
Albumin: 4.2 g/dL (ref 3.5–5.2)
Alkaline Phosphatase: 78 U/L (ref 39–117)
BUN: 18 mg/dL (ref 6–23)
CO2: 29 mEq/L (ref 19–32)
Calcium: 9.1 mg/dL (ref 8.4–10.5)
Chloride: 102 mEq/L (ref 96–112)
Creatinine, Ser: 1.01 mg/dL (ref 0.40–1.50)
GFR: 79.25 mL/min (ref >60.00)
Glucose, Bld: 130 mg/dL — ABNORMAL HIGH (ref 70–99)
Potassium: 3.8 mEq/L (ref 3.5–5.1)
Sodium: 138 mEq/L (ref 135–145)
Total Bilirubin: 0.8 mg/dL (ref 0.2–1.2)
Total Protein: 6.2 g/dL (ref 6.0–8.3)

## 2020-04-02 ENCOUNTER — Ambulatory Visit (INDEPENDENT_AMBULATORY_CARE_PROVIDER_SITE_OTHER): Payer: 59 | Admitting: Urology

## 2020-04-02 ENCOUNTER — Other Ambulatory Visit: Payer: Self-pay

## 2020-04-02 ENCOUNTER — Encounter: Payer: Self-pay | Admitting: Urology

## 2020-04-02 VITALS — BP 127/76 | HR 58 | Ht 72.0 in | Wt 190.5 lb

## 2020-04-02 DIAGNOSIS — N138 Other obstructive and reflux uropathy: Secondary | ICD-10-CM | POA: Diagnosis not present

## 2020-04-02 DIAGNOSIS — N401 Enlarged prostate with lower urinary tract symptoms: Secondary | ICD-10-CM

## 2020-04-02 DIAGNOSIS — Z125 Encounter for screening for malignant neoplasm of prostate: Secondary | ICD-10-CM | POA: Diagnosis not present

## 2020-04-02 LAB — URINALYSIS, COMPLETE
Bilirubin, UA: NEGATIVE
Ketones, UA: NEGATIVE
Leukocytes,UA: NEGATIVE
Nitrite, UA: NEGATIVE
Protein,UA: NEGATIVE
RBC, UA: NEGATIVE
Specific Gravity, UA: 1.02 (ref 1.005–1.030)
Urobilinogen, Ur: 0.2 mg/dL (ref 0.2–1.0)
pH, UA: 5 (ref 5.0–7.5)

## 2020-04-02 LAB — MICROSCOPIC EXAMINATION: Bacteria, UA: NONE SEEN

## 2020-04-02 MED ORDER — LIDOCAINE HCL URETHRAL/MUCOSAL 2 % EX GEL
1.0000 "application " | Freq: Once | CUTANEOUS | Status: AC
  Administered 2020-04-02: 1 via URETHRAL

## 2020-04-02 NOTE — Patient Instructions (Signed)
Holmium Laser Enucleation of the Prostate (HoLEP)  HoLEP is a treatment for men with benign prostatic hyperplasia (BPH). The laser surgery removed blockages of urine flow, and is done without any incisions on the body.     What is HoLEP?  HoLEP is a type of laser surgery used to treat obstruction (blockage) of urine flow as a result of benign prostatic hyperplasia (BPH). In men with BPH, the prostate gland is not cancerous, but has become enlarged. An enlarged prostate can result in a number of urinary tract symptoms such as weak urinary stream, difficulty in starting urination, inability to urinate, frequent urination, or getting up at night to urinate.  HoLEP was developed in the 1990's as a more effective and less expensive surgical option for BPH, compared to other surgical options such as laser vaporization(PVP/greenlight laser), transurethral resection of the prostate(TURP), and open simple prostatectomy.   What happens during a HoLEP?  HoLEP requires general anesthesia ("asleep" throughout the procedure).   An antibiotic is given to reduce the risk of infection  A surgical instrument called a resectoscope is inserted through the urethra (the tube that carries urine from the bladder). The resectoscope has a camera that allows the surgeon to view the internal structure of the prostate gland, and to see where the incisions are being made during surgery.  The laser is inserted into the resectoscope and is used to enucleate (free up) the enlarged prostate tissue from the capsule (outer shell) and then to seal up any blood vessels. The tissue that has been removed is pushed back into the bladder.  A morcellator is placed through the resectoscope, and is used to suction out the prostate tissue that has been pushed into the bladder.  When the prostate tissue has been removed, the resectoscope is removed, and a foley catheter is placed to allow healing and drain the urine from the  bladder.     What happens after a HoLEP?  More than 90% of patients go home the same day a few hours after surgery. Less than 10% will be admitted to the hospital overnight for observation to monitor the urine, or if they have other medical problems.  Fluid is flushed through the catheter for about 1 hour after surgery to clear any blood from the urine. It is normal to have some blood in the urine after surgery. The need for blood transfusion is extremely rare.  Eating and drinking are permitted after the procedure once the patient has fully awakened from anesthesia.  The catheter is usually removed 2-3 days after surgery- the patient will come to clinic to have the catheter removed and make sure they can urinate on their own.  It is very important to drink lots of fluids after surgery for one week to keep the bladder flushed.  At first, there may be some burning with urination, but this typically improved within a few hours to days. Most patients do not have a significant amount of pain, and narcotic pain medications are rarely needed.  Symptoms of urinary frequency, urgency, and even leakage are NORMAL for the first few weeks after surgery as the bladder adjusts after having to work hard against blockage from the prostate for many years. This will improve, but can sometimes take several months.  The use of pelvic floor exercises (Kegel exercises) can help improve problems with urinary incontinence.   After catheter removal, patients will be seen at 6 weeks and 6 months for symptom check  No heavy lifting for   at least 2-3 weeks after surgery, however patients can walk and do light activities the first day after surgery. Return to work time depends on occupation.    What are the advantages of HoLEP?  HoLEP has been studied in many different parts of the world and has been shown to be a safe and effective procedure. Although there are many types of BPH surgeries available, HoLEP offers a  unique advantage in being able to remove a large amount of tissue without any incisions on the body, even in very large prostates, while decreasing the risk of bleeding and providing tissue for pathology (to look for cancer). This decreases the need for blood transfusions during surgery, minimizes hospital stay, and reduces the risk of needing repeat treatment.  What are the side effects of HoLEP?  Temporary burning and bleeding during urination. Some blood may be seen in the urine for weeks after surgery and is part of the healing process.  Urinary incontinence (inability to control urine flow) is expected in all patients immediately after surgery and they should wear pads for the first few days/weeks. This typically improves over the course of several weeks. Performing Kegel exercises can help decrease leakage from stress maneuvers such as coughing, sneezing, or lifting. The rate of long term leakage is very low. Patients may also have leakage with urgency and this may be treated with medication. The risk of urge incontinence can be dependent on several factors including age, prostate size, symptoms, and other medical problems.  Retrograde ejaculation or "backwards ejaculation." In 75% of cases, the patient will not see any fluid during ejaculation after surgery.  Erectile function is generally not significantly affected.   What are the risks of HoLEP?  Injury to the urethra or development of scar tissue at a later date  Injury to the capsule of the prostate (typically treated with longer catheterization).  Injury to the bladder or ureteral orifices (where the urine from the kidney drains out)  Infection of the bladder, testes, or kidneys  Return of urinary obstruction at a later date requiring another operation (<2%)  Need for blood transfusion or re-operation due to bleeding  Failure to relieve all symptoms and/or need for prolonged catheterization after surgery  5-15% of patients are  found to have previously undiagnosed prostate cancer in their specimen. Prostate cancer can be treated after HoLEP.  Standard risks of anesthesia including blood clots, heart attacks, etc  When should I call my doctor?  Fever over 101.3 degrees  Inability to urinate, or large blood clots in the urine     Benign Prostatic Hyperplasia  Benign prostatic hyperplasia (BPH) is an enlarged prostate gland that is caused by the normal aging process and not by cancer. The prostate is a walnut-sized gland that is involved in the production of semen. It is located in front of the rectum and below the bladder. The bladder stores urine and the urethra is the tube that carries the urine out of the body. The prostate may get bigger as a man gets older. An enlarged prostate can press on the urethra. This can make it harder to pass urine. The build-up of urine in the bladder can cause infection. Back pressure and infection may progress to bladder damage and kidney (renal) failure. What are the causes? This condition is part of a normal aging process. However, not all men develop problems from this condition. If the prostate enlarges away from the urethra, urine flow will not be blocked. If it enlarges toward  the urethra and compresses it, there will be problems passing urine. What increases the risk? This condition is more likely to develop in men over the age of 5 years. What are the signs or symptoms? Symptoms of this condition include:  Getting up often during the night to urinate.  Needing to urinate frequently during the day.  Difficulty starting urine flow.  Decrease in size and strength of your urine stream.  Leaking (dribbling) after urinating.  Inability to pass urine. This needs immediate treatment.  Inability to completely empty your bladder.  Pain when you pass urine. This is more common if there is also an infection.  Urinary tract infection (UTI). How is this diagnosed? This  condition is diagnosed based on your medical history, a physical exam, and your symptoms. Tests will also be done, such as:  A post-void bladder scan. This measures any amount of urine that may remain in your bladder after you finish urinating.  A digital rectal exam. In a rectal exam, your health care provider checks your prostate by putting a lubricated, gloved finger into your rectum to feel the back of your prostate gland. This exam detects the size of your gland and any abnormal lumps or growths.  An exam of your urine (urinalysis).  A prostate specific antigen (PSA) screening. This is a blood test used to screen for prostate cancer.  An ultrasound. This test uses sound waves to electronically produce a picture of your prostate gland. Your health care provider may refer you to a specialist in kidney and prostate diseases (urologist). How is this treated? Once symptoms begin, your health care provider will monitor your condition (active surveillance or watchful waiting). Treatment for this condition will depend on the severity of your condition. Treatment may include:  Observation and yearly exams. This may be the only treatment needed if your condition and symptoms are mild.  Medicines to relieve your symptoms, including: ? Medicines to shrink the prostate. ? Medicines to relax the muscle of the prostate.  Surgery in severe cases. Surgery may include: ? Prostatectomy. In this procedure, the prostate tissue is removed completely through an open incision or with a laparoscope or robotics. ? Transurethral resection of the prostate (TURP). In this procedure, a tool is inserted through the opening at the tip of the penis (urethra). It is used to cut away tissue of the inner core of the prostate. The pieces are removed through the same opening of the penis. This removes the blockage. ? Transurethral incision (TUIP). In this procedure, small cuts are made in the prostate. This lessens the  prostate's pressure on the urethra. ? Transurethral microwave thermotherapy (TUMT). This procedure uses microwaves to create heat. The heat destroys and removes a small amount of prostate tissue. ? Transurethral needle ablation (TUNA). This procedure uses radio frequencies to destroy and remove a small amount of prostate tissue. ? Interstitial laser coagulation ([place]). This procedure uses a laser to destroy and remove a small amount of prostate tissue. ? Transurethral electrovaporization (TUVP). This procedure uses electrodes to destroy and remove a small amount of prostate tissue. ? Prostatic urethral lift. This procedure inserts an implant to push the lobes of the prostate away from the urethra. Follow these instructions at home:  Take over-the-counter and prescription medicines only as told by your health care provider.  Monitor your symptoms for any changes. Contact your health care provider with any changes.  Avoid drinking large amounts of liquid before going to bed or out in public.  Avoid or reduce how much caffeine or alcohol you drink.  Give yourself time when you urinate.  Keep all follow-up visits as told by your health care provider. This is important. Contact a health care provider if:  You have unexplained back pain.  Your symptoms do not get better with treatment.  You develop side effects from the medicine you are taking.  Your urine becomes very dark or has a bad smell.  Your lower abdomen becomes distended and you have trouble passing your urine. Get help right away if:  You have a fever or chills.  You suddenly cannot urinate.  You feel lightheaded, or very dizzy, or you faint.  There are large amounts of blood or clots in the urine.  Your urinary problems become hard to manage.  You develop moderate to severe low back or flank pain. The flank is the side of your body between the ribs and the hip. These symptoms may represent a serious problem that is an  emergency. Do not wait to see if the symptoms will go away. Get medical help right away. Call your local emergency services (911 in the [place] Do not drive yourself to the hospital. Summary  Benign prostatic hyperplasia (BPH) is an enlarged prostate that is caused by the normal aging process and not by cancer.  An enlarged prostate can press on the urethra. This can make it hard to pass urine.  This condition is part of a normal aging process and is more likely to develop in men over the age of 36 years.  Get help right away if you suddenly cannot urinate. This information is not intended to replace advice given to you by your health care provider. Make sure you discuss any questions you have with your health care provider. Document Revised: 10/11/2019 Document Reviewed: 10/11/2019 Elsevier Patient Education  [employer].

## 2020-04-02 NOTE — Progress Notes (Signed)
Cystoscopy Procedure Note:  Indication: BPH/LUTS, incomplete bladder emptying with PVRs of 200 mL  After informed consent and discussion of the procedure and its risks, Jeffrey Ortiz was positioned and prepped in the standard fashion. Cystoscopy was performed with a flexible cystoscope. The urethra, bladder neck and entire bladder was visualized in a standard fashion. The prostate was large with a high bladder neck and significant intravesical protrusion.  Bladder mucosa grossly normal throughout, no suspicious lesions, significant intravesical prostate protrusion on retroflexion.  Imaging: The transrectal ultrasound probe was inserted into the rectum and measurements were taken calculating a prostate volume of 85 g  Findings: Large 85 g prostate with significant intravesical protrusion  --------------------------------------------------------------------------  Assessment and Plan: We reviewed his cystoscopy and transrectal ultrasound findings from today.  With his worsening urinary symptoms despite Flomax and incomplete bladder emptying, I recommended considering an outlet procedure.  We discussed the differences between HOLEP and UroLift at length.  I think with his very large gland with intravesical protrusion and significant PVR elevation, he would be best served with HOLEP.  We discussed the risks and benefits of HoLEP at length.  The procedure requires general anesthesia and takes 2 to 3 hours, and a holmium laser is used to enucleate the prostate and push this tissue into the bladder.  A morcellator is then used to remove this tissue, which is sent for pathology.  The vast majority of patients are able to discharge the same day with a catheter in place for 2 to 3 days, and will follow-up in clinic for a voiding trial.  Approximately 5% of patients will be admitted overnight to monitor the urine, or if they have multiple co-morbidities.  We specifically discussed the risks of bleeding,  infection, retrograde ejaculation, temporary urgency and urge incontinence, very low risk of long-term incontinence, pathologic evaluation of prostate tissue and possible detection of prostate cancer or other malignancy, and possible need for additional procedures.  He will call to schedule HOLEP  Jeffrey Madrid, MD 04/02/2020

## 2020-04-17 ENCOUNTER — Other Ambulatory Visit: Payer: Self-pay

## 2020-04-21 ENCOUNTER — Ambulatory Visit (INDEPENDENT_AMBULATORY_CARE_PROVIDER_SITE_OTHER): Payer: 59 | Admitting: Family

## 2020-04-21 ENCOUNTER — Encounter: Payer: Self-pay | Admitting: Family

## 2020-04-21 ENCOUNTER — Other Ambulatory Visit: Payer: Self-pay

## 2020-04-21 VITALS — BP 130/74 | HR 61 | Temp 97.4°F | Ht 72.0 in | Wt 191.6 lb

## 2020-04-21 DIAGNOSIS — Z1211 Encounter for screening for malignant neoplasm of colon: Secondary | ICD-10-CM

## 2020-04-21 DIAGNOSIS — E119 Type 2 diabetes mellitus without complications: Secondary | ICD-10-CM | POA: Diagnosis not present

## 2020-04-21 DIAGNOSIS — I10 Essential (primary) hypertension: Secondary | ICD-10-CM | POA: Diagnosis not present

## 2020-04-21 DIAGNOSIS — E785 Hyperlipidemia, unspecified: Secondary | ICD-10-CM

## 2020-04-21 NOTE — Patient Instructions (Addendum)
Referral for colonoscopy Let us know if you dont hear back within a week in regards to an appointment being scheduled.   

## 2020-04-21 NOTE — Progress Notes (Signed)
Subjective:    Patient ID: Jeffrey Ortiz, male    DOB: 1955/03/03, 65 y.o.   MRN: [id number]  CC: Jeffrey Ortiz is a 65 y.o. male who presents today for follow up.   HPI: Accompanied by wife Had been concerned about unintentional weight loss one month ago however he reports resolved now and weight is back to baseline. He is no longer concerned with weight and suspects that general not feeling well while on crestor may have contributed.   HLD- unable to tolerate crestor and has stopped medication due to myalgia which have since resolved.   DM- compliant with jardiance 10mg , metformin 500mg  bid  HTN- lopressor 25mg  BID, lisinopril 40mg . No cp, palpitations, sob.   H/o Atrial fib- resolved. He remains on lopressor 25mg  bid. follows with Dr Jeffrey Ortiz  Follows with Dr Jeffrey Ortiz for BPH  Due colonoscopy- it was NOT done 07/2019.  HISTORY:  Past Medical History:  Diagnosis Date   Atrial flutter ([place])    a. 8.2017 s/p RFCA.   Chronic systolic CHF (congestive heart failure) ([place])    a. 09/2015 Echo: EF 40-45%, diff HK.   Essential hypertension    Hyperlipidemia    Hypertension    Overweight    Sleep apnea    "did study 2 days ago; CPAP ordered" (09/26/2015)   Type II diabetes mellitus ([place])    Past Surgical History:  Procedure Laterality Date   COLONOSCOPY WITH PROPOFOL N/A 08/10/2018   Procedure: COLONOSCOPY WITH PROPOFOL;  Surgeon: Jeffrey Manifold, MD;  Location: [facility] ENDOSCOPY;  Service: Endoscopy;  Laterality: N/A;   ELECTROPHYSIOLOGIC STUDY N/A 09/26/2015   Procedure: A-Flutter;  Surgeon: Jeffrey Sprang, MD;  Location: [facility];  Service: Cardiovascular;  Laterality: N/A;   TEE WITHOUT CARDIOVERSION N/A 08/13/2015   Procedure: TRANSESOPHAGEAL ECHOCARDIOGRAM (TEE) and cardioversion;  Surgeon: Jeffrey Merritts, MD;  Location: [facility] ORS;  Service: Cardiovascular;  Laterality: N/A;   Family History  Problem Relation Age of Onset   Heart attack Father         died in his 49's w/ cancer but dx CAD in his 5's.   Cancer Father    Other Mother 24   Heart attack Brother        died in early 66's.   Diabetes Brother    Colon cancer Neg Hx     Allergies: Patient has no known allergies. Current Outpatient Medications on File Prior to Visit  Medication Sig Dispense Refill   JARDIANCE 10 MG TABS tablet Take 1 tablet by mouth once daily 90 tablet 0   lisinopril (ZESTRIL) 40 MG tablet Take 40 mg by mouth daily.     metFORMIN (GLUCOPHAGE) 500 MG tablet Take 2 tablets (1,000 mg total) by mouth 2 (two) times daily. 360 tablet 0   metoprolol tartrate (LOPRESSOR) 25 MG tablet Take 1 tablet (25 mg total) by mouth 2 (two) times daily. 60 tablet 2   tamsulosin (FLOMAX) 0.4 MG CAPS capsule Take 1 capsule (0.4 mg total) by mouth daily. 90 capsule 3   No current facility-administered medications on file prior to visit.    Social History   Tobacco Use   Smoking status: Never Smoker   Smokeless tobacco: Never Used  Vaping Use   Vaping Use: Never used  Substance Use Topics   Alcohol use: No    Alcohol/week: 0.0 standard drinks   Drug use: No    Comment: Used marijuana a few times in college.    Review of  Systems  Constitutional: Negative for chills, fever and unexpected weight change (resolved).  Respiratory: Negative for cough.   Cardiovascular: Negative for chest pain and palpitations.  Gastrointestinal: Negative for nausea and vomiting.      Objective:    BP 130/74    Pulse 61    Temp (!) 97.4 F (36.3 C) (Oral)    Ht 6' (1.829 m)    Wt 191 lb 9.6 oz (86.9 kg)    SpO2 97%    BMI 25.99 kg/m  BP Readings from Last 3 Encounters:  04/23/20 130/74  04/02/20 127/76  03/13/20 (!) 160/86   Wt Readings from Last 3 Encounters:  04/23/20 191 lb 9.6 oz (86.9 kg)  04/02/20 190 lb 8 oz (86.4 kg)  03/13/20 187 lb (84.8 kg)    Physical Exam Vitals reviewed.  Constitutional:      Appearance: He is well-developed and well-nourished.   Cardiovascular:     Rate and Rhythm: Regular rhythm.     Heart sounds: Normal heart sounds.  Pulmonary:     Effort: Pulmonary effort is normal. No respiratory distress.     Breath sounds: Normal breath sounds. No wheezing, rhonchi or rales.  Skin:    General: Skin is warm and dry.  Neurological:     Mental Status: He is alert.  Psychiatric:        Mood and Affect: Mood and affect normal.        Speech: Speech normal.        Behavior: Behavior normal.        Assessment & Plan:   Problem List Items Addressed This Visit      Cardiovascular and Mediastinum   Essential hypertension - Primary (Chronic)    Controlled. Continue lopressor 25mg  bid, lisinopril 40mg .       Relevant Orders   Hemoglobin A1c     Endocrine   DM (diabetes mellitus), type 2 ([place])    Lab Results  Component Value Date   HGBA1C 6.3 (H) 02/01/2020   Anticipate controlled. Continue jardiance 10mg , metformin 500mg  BID.       Relevant Orders   Hemoglobin A1c   Comprehensive metabolic panel     Other   Hyperlipidemia    Uncontrolled. Advised to start crestor 5mg  once per week and gradually increase to daily.       Screen for colon cancer    Due. Prior attempt in 2021 induced atrial fibrillation. We discussed Cologuard as option however patient has never had colonoscopy and prefers to have colonoscopy first. Advised he would need cardiology clearance prior. Referral has been placed.       Relevant Orders   Ambulatory referral to Gastroenterology       I have discontinued Jeffrey Ortiz's rosuvastatin. I am also having him maintain his metoprolol tartrate, lisinopril, Jardiance, metFORMIN, and tamsulosin.   No orders of the defined types were placed in this encounter.   Return precautions given.   Risks, benefits, and alternatives of the medications and treatment plan prescribed today were discussed, and patient expressed understanding.   Education regarding symptom management and diagnosis  given to patient on AVS.  Continue to follow with Jeffrey Hawthorne, FNP for routine health maintenance.   Jeffrey Ortiz and I agreed with plan.   Jeffrey Paris, FNP

## 2020-04-22 NOTE — Assessment & Plan Note (Signed)
Uncontrolled. Advised to start crestor 5mg  once per week and gradually increase to daily.

## 2020-04-22 NOTE — Assessment & Plan Note (Signed)
Controlled. Continue lopressor 25mg  bid, lisinopril 40mg .

## 2020-04-22 NOTE — Assessment & Plan Note (Addendum)
Due. Prior attempt in 2021 induced atrial fibrillation. We discussed Cologuard as option however patient has never had colonoscopy and prefers to have colonoscopy first. Advised he would need cardiology clearance prior. Referral has been placed.

## 2020-04-22 NOTE — Assessment & Plan Note (Signed)
Lab Results  Component Value Date   HGBA1C 6.3 (H) 02/01/2020   Anticipate controlled. Continue jardiance 10mg , metformin 500mg  BID.

## 2020-04-23 ENCOUNTER — Encounter: Payer: Self-pay | Admitting: Family

## 2020-05-22 ENCOUNTER — Other Ambulatory Visit: Payer: 59

## 2020-05-27 ENCOUNTER — Other Ambulatory Visit: Payer: 59

## 2020-06-07 ENCOUNTER — Other Ambulatory Visit: Payer: Self-pay | Admitting: Family

## 2020-06-07 DIAGNOSIS — E119 Type 2 diabetes mellitus without complications: Secondary | ICD-10-CM

## 2020-08-04 ENCOUNTER — Telehealth: Payer: Self-pay | Admitting: Family

## 2020-08-04 DIAGNOSIS — E785 Hyperlipidemia, unspecified: Secondary | ICD-10-CM

## 2020-08-04 MED ORDER — ROSUVASTATIN CALCIUM 10 MG PO TABS: 10.0000 mg | ORAL_TABLET | Freq: Every day | ORAL | 3 refills | Status: DC

## 2020-08-04 NOTE — Telephone Encounter (Signed)
PT called to advise that he is needing a refill of their rosuvastatin (CRESTOR) 10 MG tablet to be called in at the [facility] on [address].

## 2020-08-04 NOTE — Telephone Encounter (Signed)
I let patient know that Crestor 10mg  was sent & patient has to extend appointment out due to his job. He did schedule for September & will do fasting labs then.

## 2020-08-04 NOTE — Telephone Encounter (Signed)
Patient is requesting crestor, it is currently not on his med list.

## 2020-08-04 NOTE — Telephone Encounter (Signed)
Call pt I sent in crestor 10mg  For some reason off his chart  Please ensure 10mg  correct, I didn't see 5mg  dose  Please ensure he has a f/u scheduled and you may order CMP , a1c, and lipid panel 2-3 days ahead of appt

## 2020-08-15 ENCOUNTER — Other Ambulatory Visit: Payer: Self-pay | Admitting: Family

## 2020-08-22 ENCOUNTER — Ambulatory Visit: Payer: 59 | Admitting: Family

## 2020-08-27 ENCOUNTER — Telehealth: Payer: Self-pay

## 2020-08-27 NOTE — Telephone Encounter (Signed)
Pt states that he has had joint and muscle pain for weeks -he just developed chest and right side neck pain/tightness today. Pt states that he has not had a full nights sleep in four weeks because he is in so much pain.  Transferred to Belenda Cruise at Access Nurse to triage. I let her know there are no appts avail today or tomorrow at time of call.

## 2020-08-27 NOTE — Telephone Encounter (Signed)
Patient was instructed by access nurse to go to ED/UC.

## 2020-08-29 NOTE — Telephone Encounter (Signed)
Just FYI patient was seen by family friend who is emergency med at [facility]. He was having severe pain from statin. He is stopping Jardiance & stain for now. He stated that Dr. Araceli Bouche will be his new PCP. He felt that when he needed to be seen & advise just to go ED. He did not get what  felt that he needed.

## 2020-08-29 NOTE — Telephone Encounter (Signed)
Call pt Concern with triage notes and CP I dont see he went to ED  Please encourage him to go

## 2020-08-29 NOTE — Telephone Encounter (Signed)
Noted Remove myself from pcp

## 2020-08-29 NOTE — Telephone Encounter (Signed)
LMTCB

## 2020-08-29 NOTE — Telephone Encounter (Signed)
PT called to return the missed call 

## 2020-09-04 NOTE — Telephone Encounter (Signed)
LM for Suzi for ro call back.

## 2020-09-04 NOTE — Telephone Encounter (Signed)
PT wife called to advise that she needed to talk to Judson Roch about PT care and health. Advise PT that Vidal Schwalbe is no longer his PCP to which she stated she needed to still talk to Judson Roch as well as that the PT does not currently have a PCP atm and then hung up. She can be reached at [phone].

## 2020-10-07 ENCOUNTER — Other Ambulatory Visit: Payer: Self-pay | Admitting: Family

## 2020-10-07 DIAGNOSIS — E119 Type 2 diabetes mellitus without complications: Secondary | ICD-10-CM

## 2020-10-13 ENCOUNTER — Telehealth: Payer: Self-pay

## 2020-10-13 DIAGNOSIS — I480 Paroxysmal atrial fibrillation: Secondary | ICD-10-CM

## 2020-10-13 DIAGNOSIS — I4892 Unspecified atrial flutter: Secondary | ICD-10-CM

## 2020-10-13 DIAGNOSIS — E119 Type 2 diabetes mellitus without complications: Secondary | ICD-10-CM

## 2020-10-13 MED ORDER — METOPROLOL TARTRATE 25 MG PO TABS: 25.0000 mg | ORAL_TABLET | Freq: Two times a day (BID) | ORAL | 2 refills | Status: DC

## 2020-10-13 MED ORDER — EMPAGLIFLOZIN 10 MG PO TABS
10.0000 mg | ORAL_TABLET | Freq: Every day | ORAL | 0 refills | Status: DC
Start: 2020-10-13 — End: 2021-04-08

## 2020-10-13 NOTE — Telephone Encounter (Signed)
Patient moved to the [place] area and he does not have an appointment with a new pcp until November.He is calling to request a refill on his metoprolol tartrate (LOPRESSOR) 25 MG tablet,JARDIANCE 10 MG TABS tablet and his beta blocker.He would like for it to be sent to the [facility] on [address].

## 2020-10-13 NOTE — Telephone Encounter (Signed)
Call patient I have refilled Lopressor, Jardiance.  For his safety I strongly advise he makes a follow-up with me ahead of his new PCP in November.  He is overdue for labs, A1c, blood pressure check. Please schedule the next few weeks in office.

## 2020-10-14 NOTE — Telephone Encounter (Signed)
Pt has appt scheduled 9/6

## 2020-10-21 ENCOUNTER — Telehealth: Payer: Self-pay | Admitting: Internal Medicine

## 2020-10-21 ENCOUNTER — Ambulatory Visit (INDEPENDENT_AMBULATORY_CARE_PROVIDER_SITE_OTHER): Payer: 59 | Admitting: Family

## 2020-10-21 ENCOUNTER — Ambulatory Visit (INDEPENDENT_AMBULATORY_CARE_PROVIDER_SITE_OTHER): Payer: 59 | Admitting: Cardiovascular Disease

## 2020-10-21 ENCOUNTER — Other Ambulatory Visit: Payer: Self-pay

## 2020-10-21 ENCOUNTER — Encounter: Payer: Self-pay | Admitting: Cardiovascular Disease

## 2020-10-21 ENCOUNTER — Telehealth: Payer: Self-pay | Admitting: Family

## 2020-10-21 ENCOUNTER — Encounter: Payer: Self-pay | Admitting: Family

## 2020-10-21 VITALS — BP 136/92 | HR 66 | Temp 97.9°F | Ht 72.0 in | Wt 193.1 lb

## 2020-10-21 VITALS — BP 110/80 | HR 122 | Ht 72.0 in | Wt 193.5 lb

## 2020-10-21 DIAGNOSIS — I4892 Unspecified atrial flutter: Secondary | ICD-10-CM

## 2020-10-21 DIAGNOSIS — I10 Essential (primary) hypertension: Secondary | ICD-10-CM

## 2020-10-21 DIAGNOSIS — I480 Paroxysmal atrial fibrillation: Secondary | ICD-10-CM

## 2020-10-21 DIAGNOSIS — E119 Type 2 diabetes mellitus without complications: Secondary | ICD-10-CM | POA: Diagnosis not present

## 2020-10-21 DIAGNOSIS — I483 Typical atrial flutter: Secondary | ICD-10-CM

## 2020-10-21 DIAGNOSIS — E785 Hyperlipidemia, unspecified: Secondary | ICD-10-CM

## 2020-10-21 DIAGNOSIS — I5022 Chronic systolic (congestive) heart failure: Secondary | ICD-10-CM | POA: Diagnosis not present

## 2020-10-21 LAB — COMPREHENSIVE METABOLIC PANEL
ALT: 16 U/L (ref 0–53)
AST: 11 U/L (ref 0–37)
Albumin: 3.9 g/dL (ref 3.5–5.2)
Alkaline Phosphatase: 75 U/L (ref 39–117)
BUN: 18 mg/dL (ref 6–23)
CO2: 23 mEq/L (ref 19–32)
Calcium: 8.9 mg/dL (ref 8.4–10.5)
Chloride: 106 mEq/L (ref 96–112)
Creatinine, Ser: 0.9 mg/dL (ref 0.40–1.50)
GFR: 90.64 mL/min (ref >60.00)
Glucose, Bld: 123 mg/dL — ABNORMAL HIGH (ref 70–99)
Potassium: 4.1 mEq/L (ref 3.5–5.1)
Sodium: 139 mEq/L (ref 135–145)
Total Bilirubin: 0.6 mg/dL (ref 0.2–1.2)
Total Protein: 6.3 g/dL (ref 6.0–8.3)

## 2020-10-21 LAB — CBC WITH DIFFERENTIAL/PLATELET
Basophils Absolute: 0.1 10*3/uL (ref 0.0–0.1)
Basophils Relative: 0.5 % (ref 0.0–3.0)
Eosinophils Absolute: 0.2 10*3/uL (ref 0.0–0.7)
Eosinophils Relative: 1.6 % (ref 0.0–5.0)
HCT: 47.1 % (ref 39.0–52.0)
Hemoglobin: 15.6 g/dL (ref 13.0–17.0)
Lymphocytes Relative: 13.2 % (ref 12.0–46.0)
Lymphs Abs: 1.5 10*3/uL (ref 0.7–4.0)
MCHC: 33.1 g/dL (ref 30.0–36.0)
MCV: 95.1 fl (ref 78.0–100.0)
Monocytes Absolute: 0.8 10*3/uL (ref 0.1–1.0)
Monocytes Relative: 7.2 % (ref 3.0–12.0)
Neutro Abs: 9 10*3/uL — ABNORMAL HIGH (ref 1.4–7.7)
Neutrophils Relative %: 77.5 % — ABNORMAL HIGH (ref 43.0–77.0)
Platelets: 265 10*3/uL (ref 150.0–400.0)
RBC: 4.96 Mil/uL (ref 4.22–5.81)
RDW: 13.9 % (ref 11.5–15.5)
WBC: 11.6 10*3/uL — ABNORMAL HIGH (ref 4.0–10.5)

## 2020-10-21 LAB — LIPID PANEL
Cholesterol: 191 mg/dL (ref 0–200)
HDL: 44.4 mg/dL (ref >39.00)
LDL Cholesterol: 132 mg/dL — ABNORMAL HIGH (ref 0–99)
NonHDL: 146.79
Total CHOL/HDL Ratio: 4
Triglycerides: 72 mg/dL (ref 0.0–149.0)
VLDL: 14.4 mg/dL (ref 0.0–40.0)

## 2020-10-21 LAB — POCT GLYCOSYLATED HEMOGLOBIN (HGB A1C): Hemoglobin A1C: 6.4 % — AB (ref 4.0–5.6)

## 2020-10-21 LAB — TSH: TSH: 1.1 u[IU]/mL (ref 0.35–5.50)

## 2020-10-21 MED ORDER — METOPROLOL SUCCINATE ER 100 MG PO TB24: 100.0000 mg | ORAL_TABLET | Freq: Every day | ORAL | 3 refills | Status: DC

## 2020-10-21 MED ORDER — METOPROLOL TARTRATE 25 MG PO TABS: 25.0000 mg | ORAL_TABLET | ORAL | 3 refills | Status: DC | PRN

## 2020-10-21 MED ORDER — APIXABAN 5 MG PO TABS: 5.0000 mg | ORAL_TABLET | Freq: Two times a day (BID) | ORAL | 1 refills | Status: DC

## 2020-10-21 NOTE — Assessment & Plan Note (Signed)
Lab Results  Component Value Date   HGBA1C 6.4 (A) 10/21/2020   Chronic, excellent control.  Continue Jardiance 10 mg, metformin 1000 mg twice daily

## 2020-10-21 NOTE — Patient Instructions (Addendum)
As discussed, you are in atrial fibrillation.    You are seeing Dr. Rockey Situ today at 3pm.  It very important that you stay vigilant as it relates to episodes of palpitation, lightheadedness, dizziness, chest pain, shortness of breath, headache or vision changes, all of these symptoms warrant calling 911 or going to the emergency room.  I have restarted Eliquis 5 mg to be taken twice a day.  This medications and anticoagulant.  Please be very vigilant as it relates to bleeding.  Nice to see you today and please all hesitate to reach out to me with any questions, concerns.

## 2020-10-21 NOTE — Telephone Encounter (Signed)
I was contacted by Ms. Arnett regarding Mr. Bencosme, who is currently in her office and appears to be in atrial fibrillation with ventricular rates ranging from 60-140 bpm.  He is reportedly asymptomatic.  He has not taken his AM metoprolol.  He was previously following in our office for afib/flutter, having last been seen in 2017 by Dr. Caryl Comes.  He was evaluated by [facility] last year.  I have recommended that his HR be monitored in the office.  It is reasonable to continue current dose of metoprolol if average resting HR < 110 bpm.  Given CHADVASC score of at least 2, anticoagulation should be started if no contraindication is identified.  Patient should be seen within the week, if possible, with the cardiology provider that he wishes to continue ongoing management with.  Nelva Bush, MD [facility]

## 2020-10-21 NOTE — Telephone Encounter (Signed)
close

## 2020-10-21 NOTE — Progress Notes (Signed)
Subjective:    Patient ID: Jeffrey Ortiz, male    DOB: 17-Oct-1955, 65 y.o.   MRN: [id number]  CC: Jeffrey Ortiz is a 65 y.o. male who presents today for follow up.   HPI: He feels well today.  No complaints.  He denies any palpitations, dizziness, shortness of breath or chest pain.  He has 1 cup of coffee daily.  Hypertension-compliant with lisinopril 40 mg, metoprolol tartrate 25 mg  Diabetes-compliant with Jardiance 10 mg, metformin 1020m bid.  He is following a low glycemic diet.  He takes Crestor every 3rd day and myalgias resolved. No fever, joint swelling.   Due colonoscopy, eye exam  Last seen by Dr. BLuana Shu12/2021  He had been on eliquis 55mbid in 2021.  No history of any bleeding disorders, or bleeding while on Eliquis.  He states he tolerated this medication well.  S/p ablation Dr Jeffrey Ortiz Dr Jeffrey Ortiz Comes   HISTORY:  Past Medical History:  Diagnosis Date   Atrial flutter (Jeffrey Ortiz   a. 8.2017 s/p RFCA.   Chronic systolic CHF (congestive heart failure) (Jeffrey Ortiz   a. 09/2015 Echo: EF 40-45%, diff HK.   Essential hypertension    Hyperlipidemia    Hypertension    Overweight    Sleep apnea    "did study 2 days ago; CPAP ordered" (09/26/2015)   Type II diabetes mellitus (Jeffrey Ortiz   Past Surgical History:  Procedure Laterality Date   COLONOSCOPY WITH PROPOFOL N/A 08/10/2018   Procedure: COLONOSCOPY WITH PROPOFOL;  Surgeon: Jeffrey Ortiz;  Location: [facility] ENDOSCOPY;  Service: Endoscopy;  Laterality: N/A;   ELECTROPHYSIOLOGIC STUDY N/A 09/26/2015   Procedure: A-Flutter;  Surgeon: Jeffrey SprangMD;  [facility];  Service: Cardiovascular;  Laterality: N/A;   TEE WITHOUT CARDIOVERSION N/A 08/13/2015   Procedure: TRANSESOPHAGEAL ECHOCARDIOGRAM (TEE) and cardioversion;  Surgeon: Jeffrey MerrittsMD;  Location: [facility] ORS;  Service: Cardiovascular;  Laterality: N/A;   Family History  Problem Relation Age of Onset   Heart attack Father        died in his  6033's/ cancer but dx CAD in his 5045's  Cancer Father    Other Mother [id number] Heart attack Brother        died in early 6032's  Diabetes Brother    Colon cancer Neg Hx     Allergies: Patient has no known allergies. Current Outpatient Medications on File Prior to Visit  Medication Sig Dispense Refill   empagliflozin (JARDIANCE) 10 MG TABS tablet Take 1 tablet (10 mg total) by mouth daily. 90 tablet 0   lisinopril (ZESTRIL) 40 MG tablet Take 40 mg by mouth daily.     metFORMIN (GLUCOPHAGE) 500 MG tablet Take 2 tablets by mouth twice daily 360 tablet 0   metoprolol tartrate (LOPRESSOR) 25 MG tablet Take 1 tablet (25 mg total) by mouth 2 (two) times daily. 60 tablet 2   rosuvastatin (CRESTOR) 10 MG tablet Take 1 tablet (10 mg total) by mouth daily. (Patient taking differently: Take 10 mg by mouth 2 (two) times a week.) 90 tablet 3   tamsulosin (FLOMAX) 0.4 MG CAPS capsule Take 1 capsule (0.4 mg total) by mouth daily. 90 capsule 3   No current facility-administered medications on file prior to visit.    Social History   Tobacco Use   Smoking status: Never   Smokeless tobacco: Never  Vaping Use   Vaping Use: Never used  Substance Use Topics   Alcohol use: No    Alcohol/week: 0.0 standard drinks   Drug use: No    Comment: Used marijuana a few times in college.    Review of Systems  Constitutional:  Negative for chills and fever.  HENT:  Negative for congestion, ear pain, rhinorrhea, sinus pressure and sore throat.   Respiratory:  Negative for cough, shortness of breath and wheezing.   Cardiovascular:  Negative for chest pain and palpitations.  Gastrointestinal:  Negative for diarrhea, nausea and vomiting.  Genitourinary:  Negative for dysuria.  Musculoskeletal:  Negative for myalgias.  Skin:  Negative for rash.  Neurological:  Negative for headaches.  Hematological:  Negative for adenopathy.     Objective:    BP (!) 136/92 (BP Location: Left Arm, Patient Position: Sitting,  Cuff Size: Large)   Pulse 66   Temp 97.9 F (36.6 C) (Oral)   Ht 6' (1.829 m)   Wt 193 lb 1.9 oz (87.6 kg)   SpO2 97%   BMI 26.19 kg/m  BP Readings from Last 3 Encounters:  10/21/20 (!) 136/92  04/23/20 130/74  04/02/20 127/76   Wt Readings from Last 3 Encounters:  10/21/20 193 lb 1.9 oz (87.6 kg)  04/23/20 191 lb 9.6 oz (86.9 kg)  04/02/20 190 lb 8 oz (86.4 kg)    Physical Exam Vitals reviewed.  Constitutional:      Appearance: He is well-developed.  Cardiovascular:     Rate and Rhythm: Rhythm regularly irregular.     Heart sounds: Normal heart sounds.  Pulmonary:     Effort: Pulmonary effort is normal. No respiratory distress.     Breath sounds: Normal breath sounds. No wheezing, rhonchi or rales.  Skin:    General: Skin is warm and dry.  Neurological:     Mental Status: He is alert.  Psychiatric:        Speech: Speech normal.        Behavior: Behavior normal.       Assessment & Plan:   Problem List Items Addressed This Visit       Cardiovascular and Mediastinum   Essential hypertension - Primary (Chronic)   Relevant Orders   Lipid panel   Comprehensive metabolic panel   CBC with Differential/Platelet   TSH   EKG 12-Lead (Completed)   Paroxysmal atrial fibrillation (HCC)    Patient asymptomatic and well-appearing on exam today.  EKG shows atrial fibrillation.  He is unable to appreciate tachycardia.  He had not taken his metoprolol tartrate 25 mg this morning, and I observed to take this medication while in the exam.  Heart rate consistently stayed between 60 and 80 bpm while patient here for 15 hours.   It did go as high as 147 on EKG today however that was not sustained.  Consulted with Dr. Saunders Revel of on-call cardiology who recommended if average heart rate less than 110, he can be managed as an outpatient.  Advised based on CHA2DS2-VASc score to start Eliquis in the absence of history of contraindications anticoagulation.  Patient has tolerated Eliquis in the  past.  CHADS VASC score of 2 and I have sent in Eliquis 5 mg twice daily.  Patient fortunately able to get a patient with cardiologist, Dr. Rockey Ortiz this afternoon at 3:20 PM.  Advised consideration for repeat evaluation for OSA ( last 09/2015). Will discuss at follow up.   Advised patient to be extremely vigilant as it relates to chest pain, shortness of breath, sustained palpitations and  if were to occur to call 911 or report to nearest emergency room.  Patient verbalized understanding of all.        Endocrine   DM (diabetes mellitus), type 2 ([place])    Lab Results  Component Value Date   HGBA1C 6.4 (A) 10/21/2020  Chronic, excellent control.  Continue Jardiance 10 mg, metformin 1000 mg twice daily      Relevant Orders   Lipid panel     Other   Hyperlipidemia    Pending lipid panel.  Advised LDL needs to be less than 70 mg. He may remain on Crestor 10 mg every third day if above is met.        Declines colonoscopy  I am having [name] maintain his lisinopril, tamsulosin, rosuvastatin, metFORMIN, metoprolol tartrate, and empagliflozin.   No orders of the defined types were placed in this encounter.   Return precautions given.   Risks, benefits, and alternatives of the medications and treatment plan prescribed today were discussed, and patient expressed understanding.   Education regarding symptom management and diagnosis given to patient on AVS.  Continue to follow with [name], FNP for routine health maintenance.   [name] and I agreed with plan.   [name], FNP  I have spent 35 minutes with a patient including precharting, exam, reviewing medical records, consulting with cardiology on call, Dr End, and discussion plan of care.

## 2020-10-21 NOTE — Patient Instructions (Addendum)
Medication Instructions:   Please STOP Lisinopril  Please START Start eliquis 5 mg twice a day [employer] coupon card given, please register  Start metoprolol succinate 100 mg once a day Take the metoprolol tartrate 25 mg as needed for heart rate >100  If you need a refill on your cardiac medications before your next appointment, please call your pharmacy.    Lab work: No new labs needed  Testing/Procedures: No new testing needed   Follow-Up: At [facility], you and your health needs are our priority.  As part of our continuing mission to provide you with exceptional heart care, we have created designated Provider Care Teams.  These Care Teams include your primary Cardiologist (physician) and Advanced Practice Providers (APPs -  Physician Assistants and Nurse Practitioners) who all work together to provide you with the care you need, when you need it.  You will need a follow up appointment in 1 month  Providers on your designated Care Team:   Murray Hodgkins, NP Christell Faith, PA-C Marrianne Mood, PA-C Cadence Somerset, [place]  COVID-19 Vaccine Information can be found at: ShippingScam.co.uk For questions related to vaccine distribution or appointments, please email [email or web address] or call 3363950877.

## 2020-10-21 NOTE — Assessment & Plan Note (Signed)
Pending lipid panel.  Advised LDL needs to be less than 70 mg. He may remain on Crestor 10 mg every third day if above is met.

## 2020-10-21 NOTE — Progress Notes (Signed)
Patient ID: Jeffrey Ortiz, male   DOB: 1955-10-29, 65 y.o.   MRN: [id number] Cardiology Office Note  Date:  10/21/2020   ID:  Jeffrey Ortiz, DOB 02/29/1955, MRN [id number]  PCP:  [name], FNP   Chief Complaint  Patient presents with   New Patient (Initial Visit)    Ref by [name], FNP for A-Fib. Medications reviewed by the patient verbally. "Doing well."     HPI:  [name] is a 65 ear-old gentleman with a history of  GERD,  diabetes, hypertension, hyperlipidemia,  hospital 08/11/2015 with worsening shortness of breath, orthopnea, PND after severe GERD, vomiting, aspiration into his sinuses noted to be in atrial flutter with RVR underwent TEE with cardioversion Cardioversion was initially successful though converted to atrial fibrillation Later back on the floor he went back into atrial flutter Atrial flutter-recurrent-typical  S/p ablation  Last seen in clinic 2017 He presents today by referral from [name] for consultation  of his atrial flutter and atrial fibrillation, persistent   Discussed recent events Covid 07/25/2020 Had severe muscle pain at the time, 2.5 months Only now starting to feel better Does not appreciate tachypalpitations concerning for arrhythmia  Seen by PMD today, noted to have erratic heart rate, confirming atrial fibrillation On EKG Rate up to 122 bpm in the office today Has been given a prescription of Eliquis by primary care Reports he is on metoprolol tartrate 25 twice daily with lisinopril 40 daily, Jardiance  Slow improvement in his A1c over the past year or so Very active at work  EKG personally reviewed by myself on todays visit Atrial fibrillation: rate 122 bpm   amiodarone 400 mg twice a day, Coreg 12.5 mg twice a day, diltiazem SR 60  mgrams twice a day, lisinopril 10 mg daily He has been taking anticoagulation with Xarelto 20 mg daily   PMH:   has a past medical history of Atrial flutter ([place]), Chronic  systolic CHF (congestive heart failure) ([place]), Essential hypertension, Hyperlipidemia, Hypertension, Overweight, Sleep apnea, and Type II diabetes mellitus ([place]).  PSH:    Past Surgical History:  Procedure Laterality Date   COLONOSCOPY WITH PROPOFOL N/A 08/10/2018   Procedure: COLONOSCOPY WITH PROPOFOL;  Surgeon: Virgel Manifold, MD;  Location: [facility] ENDOSCOPY;  Service: Endoscopy;  Laterality: N/A;   ELECTROPHYSIOLOGIC STUDY N/A 09/26/2015   Procedure: A-Flutter;  Surgeon: Deboraha Sprang, MD;  Location: [place] [facility];  Service: Cardiovascular;  Laterality: N/A;   TEE WITHOUT CARDIOVERSION N/A 08/13/2015   Procedure: TRANSESOPHAGEAL ECHOCARDIOGRAM (TEE) and cardioversion;  Surgeon: Minna Merritts, MD;  Location: [facility] ORS;  Service: Cardiovascular;  Laterality: N/A;    Current Outpatient Medications  Medication Sig Dispense Refill   apixaban (ELIQUIS) 5 MG TABS tablet Take 1 tablet (5 mg total) by mouth 2 (two) times daily. 60 tablet 1   empagliflozin (JARDIANCE) 10 MG TABS tablet Take 1 tablet (10 mg total) by mouth daily. 90 tablet 0   lisinopril (ZESTRIL) 40 MG tablet Take 40 mg by mouth daily.     metFORMIN (GLUCOPHAGE) 500 MG tablet Take 2 tablets by mouth twice daily 360 tablet 0   metoprolol tartrate (LOPRESSOR) 25 MG tablet Take 1 tablet (25 mg total) by mouth 2 (two) times daily. 60 tablet 2   rosuvastatin (CRESTOR) 10 MG tablet Take 10 mg by mouth every 3 (three) days.     tamsulosin (FLOMAX) 0.4 MG CAPS capsule Take 1 capsule (0.4 mg total) by mouth daily. 90 capsule 3  No current facility-administered medications for this visit.     Allergies:   Patient has no known allergies.   Social History:  The patient  reports that he has never smoked. He has never used smokeless tobacco. He reports that he does not drink alcohol and does not use drugs.   Family History:   family history includes Cancer in his father; Diabetes in his brother; Heart attack in his brother and  father; Other (age of onset: 46) in his mother.    Review of Systems: Review of Systems  Constitutional: Negative.   HENT: Negative.    Respiratory: Negative.    Cardiovascular: Negative.   Gastrointestinal: Negative.   Musculoskeletal: Negative.   Neurological: Negative.   Psychiatric/Behavioral: Negative.    All other systems reviewed and are negative.   PHYSICAL EXAM: VS:  BP 110/80 (BP Location: Right Arm, Patient Position: Sitting, Cuff Size: Normal)   Pulse (!) 122   Ht 6' (1.829 m)   Wt 193 lb 8 oz (87.8 kg)   SpO2 98%   BMI 26.24 kg/m  , BMI Body mass index is 26.24 kg/m. GEN: Well nourished, well developed, in no acute distress HEENT: normal Neck: no JVD, carotid bruits, or masses Cardiac: RRR; no murmurs, rubs, or gallops,no edema  Respiratory:  clear to auscultation bilaterally, normal work of breathing GI: soft, nontender, nondistended, + BS MS: no deformity or atrophy Skin: warm and dry, no rash Neuro:  Strength and sensation are intact Psych: euthymic mood, full affect  Recent Labs: 02/01/2020: Hemoglobin 16.4; Platelets 255; TSH 0.50 03/19/2020: ALT 18; BUN 18; Creatinine, Ser 1.01; Potassium 3.8; Sodium 138    Lipid Panel Lab Results  Component Value Date   CHOL 186 02/01/2020   HDL 45 02/01/2020   LDLCALC 120 (H) 02/01/2020   TRIG 104 02/01/2020      Wt Readings from Last 3 Encounters:  10/21/20 193 lb 8 oz (87.8 kg)  10/21/20 193 lb 1.9 oz (87.6 kg)  04/23/20 191 lb 9.6 oz (86.9 kg)     ASSESSMENT AND PLAN:  Atrial flutter with rapid ventricular response (HCC)  Prior ablation  Persistent atrial fibrillation Recommend he stop the lisinopril, Increase the metoprolol, change to metoprolol succinate 100 daily Suggested he use the metoprolol tartrate 25 mg as needed for heart rate over 100 Start Eliquis 5 twice daily, coupon provided Recommended we meet up again in 1 month to discuss various treatment options including restarting  amiodarone as he was on a 2017 versus proceeding with cardioversion if he remains in atrial fibrillation  COVID Had over 2 months of severe muscle pain Recommend he take a break from the statin If symptoms persist could check CRP, sed rate, CK   Total encounter time more than 60 minutes  Greater than 50% was spent in counseling and coordination of care with the patient  Patient seen in consultation for [name] and will be referred back to her office for ongoing care of the issues detailed above  Orders Placed This Encounter  Procedures   EKG 12-Lead      Signed, Esmond Plants, M.D., Ph.D. 10/21/2020  [facility] [place], [place] [phone]

## 2020-10-21 NOTE — Assessment & Plan Note (Addendum)
Patient asymptomatic and well-appearing on exam today.  EKG shows atrial fibrillation.  He is unable to appreciate tachycardia.  He had not taken his metoprolol tartrate 25 mg this morning, and I observed to take this medication while in the exam.  Heart rate consistently stayed between 60 and 80 bpm while patient here for 15 hours.   It did go as high as 147 on EKG today however that was not sustained.  Consulted with Dr. Saunders Revel of on-call cardiology who recommended if average heart rate less than 110, he can be managed as an outpatient.  Advised based on CHA2DS2-VASc score to start Eliquis in the absence of history of contraindications anticoagulation.  Patient has tolerated Eliquis in the past.  CHADS VASC score of 2 and I have sent in Eliquis 5 mg twice daily.  Patient fortunately able to get a patient with cardiologist, Dr. Rockey Situ this afternoon at 3:20 PM.  Advised consideration for repeat evaluation for OSA ( last 09/2015). Will discuss at follow up.   Advised patient to be extremely vigilant as it relates to chest pain, shortness of breath, sustained palpitations and if were to occur to call 911 or report to nearest emergency room.  Patient verbalized understanding of all.

## 2020-10-27 ENCOUNTER — Other Ambulatory Visit: Payer: Self-pay

## 2020-10-27 DIAGNOSIS — R899 Unspecified abnormal finding in specimens from other organs, systems and tissues: Secondary | ICD-10-CM

## 2020-11-20 ENCOUNTER — Ambulatory Visit (INDEPENDENT_AMBULATORY_CARE_PROVIDER_SITE_OTHER): Payer: 59 | Admitting: Physician Assistant

## 2020-11-20 ENCOUNTER — Encounter: Payer: Self-pay | Admitting: Physician Assistant

## 2020-11-20 ENCOUNTER — Other Ambulatory Visit: Payer: Self-pay

## 2020-11-20 VITALS — BP 138/70 | HR 126 | Ht 72.0 in | Wt 200.4 lb

## 2020-11-20 DIAGNOSIS — I10 Essential (primary) hypertension: Secondary | ICD-10-CM

## 2020-11-20 DIAGNOSIS — Z7901 Long term (current) use of anticoagulants: Secondary | ICD-10-CM

## 2020-11-20 DIAGNOSIS — I480 Paroxysmal atrial fibrillation: Secondary | ICD-10-CM

## 2020-11-20 DIAGNOSIS — Z9889 Other specified postprocedural states: Secondary | ICD-10-CM

## 2020-11-20 DIAGNOSIS — Z8669 Personal history of other diseases of the nervous system and sense organs: Secondary | ICD-10-CM

## 2020-11-20 DIAGNOSIS — I483 Typical atrial flutter: Secondary | ICD-10-CM | POA: Diagnosis not present

## 2020-11-20 DIAGNOSIS — Z8616 Personal history of COVID-19: Secondary | ICD-10-CM

## 2020-11-20 DIAGNOSIS — I4891 Unspecified atrial fibrillation: Secondary | ICD-10-CM | POA: Diagnosis not present

## 2020-11-20 DIAGNOSIS — I5032 Chronic diastolic (congestive) heart failure: Secondary | ICD-10-CM

## 2020-11-20 NOTE — Patient Instructions (Signed)
Medication Instructions:  Your physician recommends that you continue on your current medications as directed. Please refer to the Current Medication list given to you today. *If you need a refill on your cardiac medications before your next appointment, please call your pharmacy* Lab Work: Your physician recommends that you return for lab work (bmp, cbc) in: next week  If you have labs (blood work) drawn today and your tests are completely normal, you will receive your results only by: MyChart Message (if you have MyChart) OR A paper copy in the mail If you have any lab test that is abnormal or we need to change your treatment, we will call you to review the results.   Testing/Procedures: Your physician has requested that you have an echocardiogram. Echocardiography is a painless test that uses sound waves to create images of your heart. It provides your doctor with information about the size and shape of your heart and how well your heart's chambers and valves are working. This procedure takes approximately one hour. There are no restrictions for this procedure.    Follow-Up: At [facility], you and your health needs are our priority.  As part of our continuing mission to provide you with exceptional heart care, we have created designated Provider Care Teams.  These Care Teams include your primary Cardiologist (physician) and Advanced Practice Providers (APPs -  Physician Assistants and Nurse Practitioners) who all work together to provide you with the care you need, when you need it.  We recommend signing up for the patient portal called "MyChart".  Sign up information is provided on this After Visit Summary.  MyChart is used to connect with patients for Virtual Visits (Telemedicine).  Patients are able to view lab/test results, encounter notes, upcoming appointments, etc.  Non-urgent messages can be sent to your provider as well.   To learn more about what you can do with MyChart, go to  [email or web address].    Your next appointment:   2-4 week(s)  The format for your next appointment:   In Person  Provider:   You may see Ida Rogue, MD or one of the following Advanced Practice Providers on your designated Care Team:   Murray Hodgkins, NP Christell Faith, PA-C Marrianne Mood, PA-C Cadence Kathlen Mody, [place]   Other Instruction You have been referred to EP for discussion of AFIB treatment.  Think about [place] study for sleep apnea. 3 Options for Atrial Fibrillation 1) Repeat cardioversion 2) Amiodarone 3) EP referral (provided today)    Amiodarone Tablets    What is this medication? AMIODARONE (a MEE oh da rone) prevents and treats a fast or irregular heartbeat (arrhythmia). It works by slowing down overactive electric signals in the heart, which stabilizes your heart rhythm. It belongs to a group of medications called antiarrhythmics. This medicine may be used for other purposes; ask your health care provider or pharmacist if you have questions. COMMON BRAND NAME(S): Cordarone, Pacerone What should I tell my care team before I take this medication? They need to know if you have any of these conditions: Liver disease Lung disease Other heart problems Thyroid disease An unusual or allergic reaction to amiodarone, iodine, other medications, foods, dyes, or preservatives Pregnant or trying to get pregnant Breast-feeding How should I use this medication? Take this medication by mouth with a glass of water. Follow the directions on the prescription label. You can take this medication with or without food. However, you should always take it the same way each time. Take your doses  at regular intervals. Do not take your medication more often than directed. Do not stop taking except on the advice of your care team. A special MedGuide will be given to you by the pharmacist with each prescription and refill. Be sure to read this information carefully each time. Talk  to your care team regarding the use of this medication in children. Special care may be needed. Overdosage: If you think you have taken too much of this medicine contact a poison control center or emergency room at once. NOTE: This medicine is only for you. Do not share this medicine with others. What if I miss a dose? If you miss a dose, take it as soon as you can. If it is almost time for your next dose, take only that dose. Do not take double or extra doses. What may interact with this medication? Do not take this medication with any of the following: Abarelix Apomorphine Arsenic trioxide Certain antibiotics like erythromycin, gemifloxacin, levofloxacin, pentamidine Certain medications for depression like amoxapine, tricyclic antidepressants Certain medications for fungal infections like fluconazole, itraconazole, ketoconazole, posaconazole, voriconazole Certain medications for irregular heartbeat like disopyramide, dronedarone, ibutilide, propafenone, sotalol Certain medications for malaria like chloroquine, halofantrine Cisapride Droperidol Haloperidol [place] Maprotiline Methadone Phenothiazines like chlorpromazine, mesoridazine, thioridazine Pimozide Ranolazine Red yeast rice Vardenafil This medication may also interact with the following: Antiviral medications for HIV or AIDS Certain medications for blood pressure, heart disease, irregular heart beat Certain medications for cholesterol like atorvastatin, cerivastatin, lovastatin, simvastatin Certain medications for hepatitis C like sofosbuvir and ledipasvir; sofosbuvir Certain medications for seizures like phenytoin Certain medications for thyroid problems Certain medications that treat or prevent blood clots like warfarin Cholestyramine Cimetidine Clopidogrel Cyclosporine Dextromethorphan Diuretics Dofetilide Fentanyl General anesthetics Grapefruit juice Lidocaine Loratadine Methotrexate Other medications that  prolong the QT interval (cause an abnormal heart rhythm) Procainamide Quinidine Rifabutin, rifampin, or rifapentine St. John's Wort Trazodone Ziprasidone This list may not describe all possible interactions. Give your health care provider a list of all the medicines, herbs, non-prescription drugs, or dietary supplements you use. Also tell them if you smoke, drink alcohol, or use illegal drugs. Some items may interact with your medicine. What should I watch for while using this medication? Your condition will be monitored closely when you first begin therapy. Often, this medication is first started in a hospital or other monitored health care setting. Once you are on maintenance therapy, visit your care team for regular checks on your progress. Because your condition and use of this medication carry some risk, it is a good idea to carry an identification card, necklace or bracelet with details of your condition, medications, and care team. You may get drowsy or dizzy. Do not drive, use machinery, or do anything that needs mental alertness until you know how this medication affects you. Do not stand or sit up quickly, especially if you are an older patient. This reduces the risk of dizzy or fainting spells. This medication can make you more sensitive to the sun. Keep out of the sun. If you cannot avoid being in the sun, wear protective clothing and use sunscreen. Do not use sun lamps or tanning beds/booths. You should have regular eye exams before and during treatment. Call your care team if you have blurred vision, see halos, or your eyes become sensitive to light. Your eyes may get dry. It may be helpful to use a lubricating eye solution or artificial tears solution. If you are going to have surgery or a procedure that  requires contrast dyes, tell your care team that you are taking this medication. What side effects may I notice from receiving this medication? Side effects that you should report to  your care team as soon as possible: Allergic reactions-skin rash, itching, hives, swelling of the face, lips, tongue, or throat Bluish-gray skin Change in vision such as blurry vision, seeing halos around lights, vision loss Heart failure-shortness of breath, swelling of the ankles, feet, or hands, sudden weight gain, unusual weakness or fatigue Heart rhythm changes-fast or irregular heartbeat, dizziness, feeling faint or lightheaded, chest pain, trouble breathing High thyroid levels (hyperthyroidism)-fast or irregular heartbeat, weight loss, excessive sweating or sensitivity to heat, tremors or shaking, anxiety, nervousness, irregular menstrual cycle or spotting Liver injury-right upper belly pain, loss of appetite, nausea, light-colored stool, dark yellow or brown urine, yellowing skin or eyes, unusual weakness or fatigue Low thyroid levels (hypothyroidism)-unusual weakness or fatigue, sensitivity to cold, constipation, hair loss, dry skin, weight gain, feelings of depression Lung injury-shortness of breath or trouble breathing, cough, spitting up blood, chest pain, fever Pain, tingling, or numbness in the hands or feet, muscle weakness, trouble walking, loss of balance or coordination Side effects that usually do not require medical attention (report to your care team if they continue or are bothersome): Nausea Vomiting This list may not describe all possible side effects. Call your doctor for medical advice about side effects. You may report side effects to FDA at 1-800-FDA-1088. Where should I keep my medication? Keep out of the reach of children and pets. Store at room temperature between 20 and 25 degrees C (68 and 77 degrees F). Protect from light. Keep container tightly closed. Throw away any unused medication after the expiration date. NOTE: This sheet is a summary. It may not cover all possible information. If you have questions about this medicine, talk to your doctor, pharmacist, or  health care provider.  2022 Elsevier/Gold Standard (2020-03-06 11:04:04)

## 2020-11-20 NOTE — Progress Notes (Signed)
Office Visit    Patient Name: Jeffrey Ortiz Date of Encounter: 11/20/2020  PCP:  [name], [place]  Cardiologist: Dr. Rockey Situ Advanced Practice Provider:  No care team member to display Electrophysiologist:  None :[id number]}   Chief Complaint    Chief Complaint  Patient presents with   Other    1 month f/u no complaints today. Meds reviewed verbally with pt.    65 y.o. male with history of hypertension, DM2, atrial flutter s/p cardioversion / ablation, chronic systolic heart failure, obesity, previous 07/2020 COVID-19, GERD/esophageal reflux, sleep apnea, hyperlipidemia, and seen today for follow-up.  Past Medical History    Past Medical History:  Diagnosis Date   Atrial flutter ([place])    a. 8.2017 s/p RFCA.   Chronic systolic CHF (congestive heart failure) ([place])    a. 09/2015 Echo: EF 40-45%, diff HK.   Essential hypertension    Hyperlipidemia    Hypertension    Overweight    Sleep apnea    "did study 2 days ago; CPAP ordered" (09/26/2015)   Type II diabetes mellitus ([place])    Past Surgical History:  Procedure Laterality Date   COLONOSCOPY WITH PROPOFOL N/A 08/10/2018   Procedure: COLONOSCOPY WITH PROPOFOL;  Surgeon: Virgel Manifold, MD;  Location: [facility] ENDOSCOPY;  Service: Endoscopy;  Laterality: N/A;   ELECTROPHYSIOLOGIC STUDY N/A 09/26/2015   Procedure: A-Flutter;  Surgeon: Deboraha Sprang, MD;  Location: [facility];  Service: Cardiovascular;  Laterality: N/A;   TEE WITHOUT CARDIOVERSION N/A 08/13/2015   Procedure: TRANSESOPHAGEAL ECHOCARDIOGRAM (TEE) and cardioversion;  Surgeon: Minna Merritts, MD;  Location: [facility] ORS;  Service: Cardiovascular;  Laterality: N/A;    Allergies  No Known Allergies  History of Present Illness    Jeffrey Ortiz is a 65 y.o. male with PMH as above.  He was admitted 08/11/2015 with worsening shortness of breath, orthopnea, PND that reportedly started after a severe episode of  emesis/GERD.  During this admission, he was noted to be in atrial flutter with RVR.  He underwent TEE with cardioversion, which was initially successful, though he later converted to atrial fibrillation, followed by flutter.  He underwent ablation for atrial flutter in 2017.    He was last seen 10/21/2020 by his primary cardiologist.  At that time, he was noted he was last seen in clinic in 2017.  He was referred back to the clinic by his PCP for atrial fibrillation.  During his visit, he reported suffering from COVID-19 07/25/2020.  He had severe myalgias at that time for 2 and half months and just now improving.  It was noted that, if symptoms persisted at follow-up in 1 mo, he should attempt a statin holiday +/-  CRP/sed rate/CK labs.  He denied any tachypalpitations with EKG of atrial fibrillation with ventricular rate 122 bpm.  Lisinopril was discontinued.  Metoprolol was transitioned to Toprol 100 daily.  Metoprolol tartrate 25 mg changed to PRN for ventricular rate over 100 Bpm.  He was started on Eliquis 5 Mg BID with coupon provided. At RTC in 1 month, further tx options were to be discussed including amiodarone v repeat DCCV.  Today, 11/20/2020, he returns to clinic and is in atrial fibrillation with rapid ventricular rate.  He has not been taking his as needed Lopressor and is agreeable to restart this today.  No chest pain or shortness of breath.  No signs or symptoms of volume overload.  He is relatively  asymptomatic in atrial fibrillation.  We reviewed the options as discussed by his primary cardiologist after his most previous clinic visit.  Rate control was recommended.  He reports compliance with anticoagulation.  No signs or symptoms of bleeding.  Of note, he reports a history of borderline sleep apnea.  Home Medications   Current Outpatient Medications  Medication Instructions   apixaban (ELIQUIS) 5 mg, Oral, 2 times daily   empagliflozin (JARDIANCE) 10 mg, Oral, Daily   metFORMIN  (GLUCOPHAGE) 500 MG tablet Take 2 tablets by mouth twice daily   metoprolol succinate (TOPROL-XL) 100 mg, Oral, Daily, Take with or immediately following a meal.   metoprolol tartrate (LOPRESSOR) 25 mg, Oral, As needed, Take for HR >100   rosuvastatin (CRESTOR) 10 mg, Oral, Every 3 DAYS   tamsulosin (FLOMAX) 0.4 mg, Oral, Daily     Review of Systems    He denies chest pain, palpitations, dyspnea, pnd, orthopnea, n, v, dizziness, syncope, edema, weight gain, or early satiety.   All other systems reviewed and are otherwise negative except as noted above.  Physical Exam    VS:  BP 138/70 (BP Location: Left Arm, Patient Position: Sitting, Cuff Size: Normal)   Pulse (!) 126   Ht 6' (1.829 m)   Wt 200 lb 6 oz (90.9 kg)   SpO2 98%   BMI 27.18 kg/m  , BMI Body mass index is 27.18 kg/m. GEN: Well nourished, well developed, in no acute distress. HEENT: normal. Neck: Supple, no JVD, carotid bruits, or masses. Cardiac: IRIR and tachycardic, no murmurs, rubs, or gallops. No clubbing, cyanosis, mild bilateral edema.  Radials/DP/PT 2+ and equal bilaterally.  Respiratory:  Respirations regular and unlabored, clear to auscultation bilaterally. GI: Soft, nontender, nondistended, BS + x 4. MS: no deformity or atrophy. Skin: warm and dry, no rash. Neuro:  Strength and sensation are intact. Psych: Normal affect.  Accessory Clinical Findings    ECG personally reviewed by me today - Afib with RVR, 126bpm, TWI lateral leads  - no acute changes.  VITALS Reviewed today   Temp Readings from Last 3 Encounters:  10/21/20 97.9 F (36.6 C) (Oral)  04/23/20 (!) 97.4 F (36.3 C) (Oral)  02/01/20 97.7 F (36.5 C)   BP Readings from Last 3 Encounters:  10/21/20 110/80  10/21/20 (!) 136/92  04/23/20 130/74   Pulse Readings from Last 3 Encounters:  10/21/20 (!) 122  10/21/20 66  04/23/20 61    Wt Readings from Last 3 Encounters:  10/21/20 193 lb 8 oz (87.8 kg)  10/21/20 193 lb 1.9 oz (87.6 kg)   04/23/20 191 lb 9.6 oz (86.9 kg)     LABS  reviewed today   Lab Results  Component Value Date   WBC 11.6 (H) 10/21/2020   HGB 15.6 10/21/2020   HCT 47.1 10/21/2020   MCV 95.1 10/21/2020   PLT 265.0 10/21/2020   Lab Results  Component Value Date   CREATININE 0.90 10/21/2020   BUN 18 10/21/2020   NA 139 10/21/2020   K 4.1 10/21/2020   CL 106 10/21/2020   CO2 23 10/21/2020   Lab Results  Component Value Date   ALT 16 10/21/2020   AST 11 10/21/2020   ALKPHOS 75 10/21/2020   BILITOT 0.6 10/21/2020   Lab Results  Component Value Date   CHOL 191 10/21/2020   HDL 44.40 10/21/2020   LDLCALC 132 (H) 10/21/2020   TRIG 72.0 10/21/2020   CHOLHDL 4 10/21/2020    Lab Results  Component  Value Date   HGBA1C 6.4 (A) 10/21/2020   Lab Results  Component Value Date   TSH 1.10 10/21/2020     STUDIES/PROCEDURES reviewed today   Echo 2017 - Left ventricle: The cavity size was mildly dilated. Wall    thickness was increased in a pattern of mild LVH. Systolic    function was normal. The estimated ejection fraction was in the    range of 55% to 60%. Wall motion was normal; there were no    regional wall motion abnormalities.   Electrophysiology study with catheter ablation for atrial flutter 8.11.2017 IMPRESSION: 1. Sinus bradycardia. 2. Abnormal atrial function manifested by sustained atrial flutter. 3. Abnormal atrioventricular nodal function manifested by prolonged AV     Wenckebach cycle length likely iatrogenic. 4. Normal His-Purkinje system function. 5. No accessory pathway. 6. Normal ventricular response to programmed stimulation. 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  The substrate was modified with ablation across the cavotricuspid isthmus.  Sensitivity of long-term bidirectional block is mitigated by the presence of amiodarone.  We will observe the  patient overnight with maintained anticoagulation.  We will discontinue his amiodarone and anticipate maintaining anticoagulation for 2-3 months until we have relative confidence that there was no recurrence of his atrial arrhythmia. _____________  Assessment & Plan    Persistent Afib/flutter with RVR s/p prior ablation and DCCV --Asymptomatic in atrial fibrillation today with history of prior ablation and cardioversion.  Ventricular rate still suboptimally controlled at 126 bpm.  He continues on Toprol 100 mg daily.  He has not been taking his as needed Lopressor with recommendation that he start this today for ventricular rates over 100 bpm.  Could also consider increasing to Toprol 100 mg twice daily.  He will contact the office if ventricular rate is still not well controlled with addition of Lopressor at home.  Obtain echo, and if EF normal, consider addition of diltiazem given room in BP.  We did discuss treatment of sleep apnea at length today.  Also discussed was other risk factors for apnea.  He does not drink alcohol.  Continue Eliquis 5 mg twice daily.  He has not missed any doses of anticoagulation and denies any signs or symptoms of bleeding..  Discussed restart of previous amiodarone, as well as cardioversion versus +/- referral to EP.  All options discussed in detail.  Patient preference is to discuss these options with his wife.  EP referral still provided today, in the event that this helps to assist him with his decision making.  In the meantime, obtain CBC, CMET.  Consider WatchPat between visits.  COVID19 history --Reviewed his previous COVID-19 infection.  He reports he is feeling much improved from his previous symptoms.  We will defer CRP, sed rate, CK.  History of sleep apnea -- Reports a borderline history of sleep apnea.  Discussed recommendation for treatment of sleep apnea, if indeed diagnosed.  Discussed WatchPat.  Reviewed risks of untreated apnea in detail with pt  understanding.  Essential hypertension --Continue current Toprol and as needed Lopressor.  He is restarting as needed Lopressor for ventricular rates over 100 today.  If EF normal on echo, consider diltiazem versus increased dose Toprol. Consider restart of ACE/ARB at RTC given comorbid DM2.  HLD --LDL goal below 70.  Continue statin.  DM2 --If room in BP at RTC, consider restart of ACE/ARB given comorbid DM2.  Continue Jardiance.  Glycemic control recommended for risk  factor modification.  Medication changes: Deferred (thinking about with wife) Labs ordered: C-Met, CBC Studies / Imaging ordered: Echo.  EP referral. DCCV deferred (thinking about with wife) Future considerations: Amiodarone v DCCV v EP options including alternative antiarrhythmic or ablation, diltiazem verus increased doseToprol, Restart of ACE/ARB given comorbid DM2 Disposition: RTC 2-4 weeks   *Please be aware that the above documentation was completed voice recognition software and may contain dictation errors.     Arvil Chaco, PA-C 11/20/2020

## 2020-11-23 ENCOUNTER — Encounter: Payer: Self-pay | Admitting: Physician Assistant

## 2020-12-08 ENCOUNTER — Other Ambulatory Visit: Payer: Self-pay

## 2020-12-08 ENCOUNTER — Other Ambulatory Visit (INDEPENDENT_AMBULATORY_CARE_PROVIDER_SITE_OTHER): Payer: 59

## 2020-12-08 DIAGNOSIS — R899 Unspecified abnormal finding in specimens from other organs, systems and tissues: Secondary | ICD-10-CM

## 2020-12-09 LAB — CBC WITH DIFFERENTIAL/PLATELET
Basophils Absolute: 0.1 10*3/uL (ref 0.0–0.1)
Basophils Relative: 0.7 % (ref 0.0–3.0)
Eosinophils Absolute: 0.1 10*3/uL (ref 0.0–0.7)
Eosinophils Relative: 1.4 % (ref 0.0–5.0)
HCT: 50 % (ref 39.0–52.0)
Hemoglobin: 16.7 g/dL (ref 13.0–17.0)
Lymphocytes Relative: 18.6 % (ref 12.0–46.0)
Lymphs Abs: 1.4 10*3/uL (ref 0.7–4.0)
MCHC: 33.3 g/dL (ref 30.0–36.0)
MCV: 93.4 fl (ref 78.0–100.0)
Monocytes Absolute: 1 10*3/uL (ref 0.1–1.0)
Monocytes Relative: 13.5 % — ABNORMAL HIGH (ref 3.0–12.0)
Neutro Abs: 5.1 10*3/uL (ref 1.4–7.7)
Neutrophils Relative %: 65.8 % (ref 43.0–77.0)
Platelets: 221 10*3/uL (ref 150.0–400.0)
RBC: 5.35 Mil/uL (ref 4.22–5.81)
RDW: 14.1 % (ref 11.5–15.5)
WBC: 7.7 10*3/uL (ref 4.0–10.5)

## 2020-12-12 ENCOUNTER — Ambulatory Visit: Payer: 59 | Admitting: Physician Assistant

## 2020-12-23 ENCOUNTER — Other Ambulatory Visit: Payer: Self-pay | Admitting: Physician Assistant

## 2020-12-23 DIAGNOSIS — I4891 Unspecified atrial fibrillation: Secondary | ICD-10-CM

## 2020-12-26 ENCOUNTER — Other Ambulatory Visit: Payer: 59

## 2021-01-02 ENCOUNTER — Ambulatory Visit: Payer: 59 | Admitting: Cardiovascular Disease

## 2021-01-26 ENCOUNTER — Ambulatory Visit: Payer: 59 | Admitting: Family

## 2021-01-29 ENCOUNTER — Other Ambulatory Visit: Payer: 59

## 2021-01-30 ENCOUNTER — Ambulatory Visit: Payer: 59 | Admitting: Cardiovascular Disease

## 2021-02-16 ENCOUNTER — Other Ambulatory Visit: Payer: Self-pay | Admitting: Family

## 2021-02-16 DIAGNOSIS — I480 Paroxysmal atrial fibrillation: Secondary | ICD-10-CM

## 2021-02-17 ENCOUNTER — Other Ambulatory Visit: Payer: Self-pay

## 2021-02-17 ENCOUNTER — Telehealth: Payer: Self-pay | Admitting: Family

## 2021-02-17 DIAGNOSIS — I4892 Unspecified atrial flutter: Secondary | ICD-10-CM

## 2021-02-17 NOTE — Telephone Encounter (Signed)
Patient called in and stated medication Eliquis 5 MG and metformin 500 mg is $650 Patient can not afford that. Please call patient @ [phone]

## 2021-02-17 NOTE — Telephone Encounter (Signed)
CCM referral needed.   Please call and see what his insurance status is. 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 Noted that Medicaid is pending?   If he has Comptroller (no Medicare), he can use Eliquis savings card on manufacturer website to reduce copay.

## 2021-02-17 NOTE — Telephone Encounter (Signed)
Pt only has Comptroller & was never approved for [employer]. I walked him through how to print Eliquis savings card. He will call back if any issues & I have placed referral to you.

## 2021-02-18 NOTE — Telephone Encounter (Signed)
Can prepare

## 2021-02-18 NOTE — Telephone Encounter (Signed)
noted 

## 2021-02-18 NOTE — Telephone Encounter (Signed)
**Note Jeffrey-Identified via Obfuscation** Called patient. Attempted to help download Eliquis savings card, but website noted that it was duplicate information. He probably already has a card. Provided the Eliquis help desk number for him to call to get card information.   He notes he has been out of Eliquis for 2 days. Advised that we do not have Eliquis 5 mg samples right now, but we cna use 2.5 mg tablets.   Medication Samples have been provided to the patient.  Drug name: Eliquis       Strength: 2.5 mg        Qty: 4 boxes  LOT: [id number]  Exp.Date: 10/2021  Dosing instructions: Take 2 tablets (5 mg) by mouth twice daily.   The patient has been instructed regarding the correct time, dose, and frequency of taking this medication, including desired effects and most common side effects.   Jeffrey Ortiz 2:04 PM 02/18/2021

## 2021-02-18 NOTE — Telephone Encounter (Signed)
Patient called back in stated the website  not letting him qualify to get copay card $10 Patient would like Catie to call  @ [phone]

## 2021-02-23 ENCOUNTER — Telehealth: Payer: Self-pay

## 2021-02-23 NOTE — Chronic Care Management (AMB) (Signed)
°  Care Management   Note  02/23/2021 Name: Danilo Cappiello MRN: [id number] DOB: 1956-02-09  Connery Shiffler is a 66 y.o. year old male who is a primary care patient of [name], FNP. I reached out to [name] by phone today in response to a referral sent by Mr. Kahleel Fadeley primary care provider.   Mr. Glance was given information about care management services today including:  Care management services include personalized support from designated clinical staff supervised by his physician, including individualized plan of care and coordination with other care providers 24/7 contact phone numbers for assistance for urgent and routine care needs. The patient may stop care management services at any time by phone call to the office staff.  Patient did not agree to enrollment in care management services and does not wish to consider at this time.  Follow up plan: Patient declines engagement by the care management team. Appropriate care team members and provider have been notified via electronic communication.   Noreene Larsson, [place], [place], [place] [address] Direct Dial: [phone] [email or web address]

## 2021-03-04 ENCOUNTER — Other Ambulatory Visit: Payer: 59

## 2021-03-09 ENCOUNTER — Ambulatory Visit: Payer: 59 | Admitting: Cardiovascular Disease

## 2021-03-11 ENCOUNTER — Ambulatory Visit: Payer: 59 | Admitting: Family

## 2021-03-26 ENCOUNTER — Other Ambulatory Visit: Payer: Medicaid Other

## 2021-03-31 ENCOUNTER — Telehealth (INDEPENDENT_AMBULATORY_CARE_PROVIDER_SITE_OTHER): Payer: Self-pay | Admitting: Family

## 2021-03-31 ENCOUNTER — Encounter: Payer: Self-pay | Admitting: Family

## 2021-03-31 VITALS — Ht 72.0 in

## 2021-03-31 DIAGNOSIS — E785 Hyperlipidemia, unspecified: Secondary | ICD-10-CM

## 2021-03-31 DIAGNOSIS — R899 Unspecified abnormal finding in specimens from other organs, systems and tissues: Secondary | ICD-10-CM

## 2021-03-31 DIAGNOSIS — I4892 Unspecified atrial flutter: Secondary | ICD-10-CM

## 2021-03-31 DIAGNOSIS — I480 Paroxysmal atrial fibrillation: Secondary | ICD-10-CM

## 2021-03-31 DIAGNOSIS — Z1211 Encounter for screening for malignant neoplasm of colon: Secondary | ICD-10-CM

## 2021-03-31 DIAGNOSIS — E119 Type 2 diabetes mellitus without complications: Secondary | ICD-10-CM

## 2021-03-31 MED ORDER — METOPROLOL TARTRATE 25 MG PO TABS: 25.0000 mg | ORAL_TABLET | ORAL | 3 refills | Status: DC | PRN

## 2021-03-31 MED ORDER — ROSUVASTATIN CALCIUM 10 MG PO TABS: 10.0000 mg | ORAL_TABLET | ORAL | 0 refills | Status: DC

## 2021-03-31 NOTE — Assessment & Plan Note (Signed)
Previous severe arthralgias, suspected from prior COVID-19, have completely resolved.  Encourage patient to retrial Crestor 10 mg starting once weekly and gradually increase to daily.  He was agreeable to plan

## 2021-03-31 NOTE — Progress Notes (Signed)
Virtual Visit via Video Note  I connected with@  on 03/31/21 at 11:00 AM EST by a video enabled telemedicine application and verified that I am speaking with the correct person using two identifiers.  Location patient: home Location provider:work  Persons participating in the virtual visit: patient, provider  I discussed the limitations of evaluation and management by telemedicine and the availability of in person appointments. The patient expressed understanding and agreed to proceed.   HPI: Feels well today  He denies palpitations and HR average < 100.   Due for cbc lab which has been scheduled  Arthralgia has resolved. Covid 07/2020. He had stopped  crestor 10mg   10/2020 after seeing Dr Rockey Situ.   Follows with cardiology, last visit 11/20/2020 for atrial fibrillation He is compliant with Toprol 100 mg, Eliquis 5 mg twice daily. He takes the lopressor 25mg  prn very seldom.   Diabetes-compliant with Jardiance  10mg , metformin 1000mg  bid    ROS: See pertinent positives and negatives per HPI.    EXAM:  VITALS per patient if applicable: Ht 6' (5.366 m)    BMI 27.18 kg/m  BP Readings from Last 3 Encounters:  11/20/20 138/70  10/21/20 110/80  10/21/20 (!) 136/92   Wt Readings from Last 3 Encounters:  11/20/20 200 lb 6 oz (90.9 kg)  10/21/20 193 lb 8 oz (87.8 kg)  10/21/20 193 lb 1.9 oz (87.6 kg)    GENERAL: alert, oriented, appears well and in no acute distress  HEENT: atraumatic, conjunttiva clear, no obvious abnormalities on inspection of external nose and ears  NECK: normal movements of the head and neck  LUNGS: on inspection no signs of respiratory distress, breathing rate appears normal, no obvious gross SOB, gasping or wheezing  CV: no obvious cyanosis  MS: moves all visible extremities without noticeable abnormality  PSYCH/NEURO: pleasant and cooperative, no obvious depression or anxiety, speech and thought processing grossly intact  ASSESSMENT AND  PLAN:  Discussed the following assessment and plan:  Problem List Items Addressed This Visit       Cardiovascular and Mediastinum   Atrial flutter (HCC)   Relevant Medications   metoprolol tartrate (LOPRESSOR) 25 MG tablet   rosuvastatin (CRESTOR) 10 MG tablet   Paroxysmal atrial fibrillation (HCC)    Chronic, stable.  Currently asymptomatic.  Advised to continue Toprol 100 mg, Eliquis 5 mg twice daily, lopressor 25mg  prn.  He has upcoming appoint with Dr. Rockey Situ.   will follow      Relevant Medications   metoprolol tartrate (LOPRESSOR) 25 MG tablet   rosuvastatin (CRESTOR) 10 MG tablet     Endocrine   DM (diabetes mellitus), type 2 ([place]) - Primary    A1c overdue.  Pending A1c.  Continue Jardiance  10mg , metformin 1000mg  bid.  Would consider ACE /ARB and plan to discuss at follow up. Pending urine microalbumin.       Relevant Medications   rosuvastatin (CRESTOR) 10 MG tablet   Other Relevant Orders   Hemoglobin A1c   Comprehensive metabolic panel   Microalbumin / creatinine urine ratio     Other   Hyperlipidemia    Previous severe arthralgias, suspected from prior COVID-19, have completely resolved.  Encourage patient to retrial Crestor 10 mg starting once weekly and gradually increase to daily.  He was agreeable to plan      Relevant Medications   metoprolol tartrate (LOPRESSOR) 25 MG tablet   rosuvastatin (CRESTOR) 10 MG tablet   Screen for colon cancer    I have  placed referral for colonoscopy again.  Advised patient to discuss with cardiology preop clearance.  He will do so next  month with Dr. Rockey Situ      Relevant Orders   Ambulatory referral to Gastroenterology   Other Visit Diagnoses     Abnormal laboratory test       Relevant Orders   CBC with Differential/Platelet       -we discussed possible serious and likely etiologies, options for evaluation and workup, limitations of telemedicine visit vs in person visit, treatment, treatment risks and  precautions. Pt prefers to treat via telemedicine empirically rather then risking or undertaking an in person visit at this moment.  .   I discussed the assessment and treatment plan with the patient. The patient was provided an opportunity to ask questions and all were answered. The patient agreed with the plan and demonstrated an understanding of the instructions.   The patient was advised to call back or seek an in-person evaluation if the symptoms worsen or if the condition fails to improve as anticipated.   [name], FNP

## 2021-03-31 NOTE — Patient Instructions (Signed)
Restart crestor 10mg  once per week as discussed  Labs as scheduled.     Referral for colonoscopy  Let us know if you dont hear back within a week in regards to an appointment being scheduled.

## 2021-03-31 NOTE — Assessment & Plan Note (Signed)
A1c overdue.  Pending A1c.  Continue Jardiance  10mg , metformin 1000mg  bid.  Would consider ACE /ARB and plan to discuss at follow up. Pending urine microalbumin.

## 2021-03-31 NOTE — Assessment & Plan Note (Signed)
I have placed referral for colonoscopy again.  Advised patient to discuss with cardiology preop clearance.  He will do so next  month with Dr. Rockey Situ

## 2021-03-31 NOTE — Assessment & Plan Note (Signed)
Chronic, stable.  Currently asymptomatic.  Advised to continue Toprol 100 mg, Eliquis 5 mg twice daily, lopressor 25mg  prn.  He has upcoming appoint with Dr. Rockey Situ.   will follow

## 2021-04-01 ENCOUNTER — Other Ambulatory Visit: Payer: Self-pay

## 2021-04-01 ENCOUNTER — Telehealth: Payer: Self-pay | Admitting: Cardiovascular Disease

## 2021-04-01 DIAGNOSIS — Z1211 Encounter for screening for malignant neoplasm of colon: Secondary | ICD-10-CM

## 2021-04-01 MED ORDER — PEG 3350-KCL-NA BICARB-NACL 420 G PO SOLR: 4000.0000 mL | Freq: Once | ORAL | 0 refills | Status: AC

## 2021-04-01 NOTE — Telephone Encounter (Signed)
° °  Name:  Jeffrey Ortiz  DOB:  1955/12/14  MRN:  [id number]   Primary Cardiologist: Dr. Rockey Situ  Chart reviewed as part of pre-operative protocol coverage. Jeffrey Ortiz was last seen 11/2020 by Marrianne Mood PA-C at which time he was experiencing rapid atrial fibrillation. Medications were adjusted and 2-4 week follow-up was arranged to discuss next steps. He has not yet returned for necessary follow-up. He has follow-up scheduled with Dr. Rockey Situ 04/24/21 at which time this clearance can be addressed in case there are any plans made that would impact pre-operative recommendations. (Colonoscopy date of 05/13/21 falls after appointment.)  I added preop FYI to appointment notes so that provider is aware to address at time of OV. Per office protocol, the provider seeing this patient should forward their finalized clearance decision to requesting party below. Will route as FYI to requesting provider. Will remove from preop box as separate preop APP input not necessary at this time.  Charlie Pitter, PA-C 04/01/2021, 5:11 PM

## 2021-04-01 NOTE — Progress Notes (Signed)
Gastroenterology Pre-Procedure Review  Request Date: 05/13/2021 Requesting Physician: Dr Marius Ditch  PATIENT REVIEW QUESTIONS: The patient responded to the following health history questions as indicated:    1. Are you having any GI issues? no 2. Do you have a personal history of Polyps? no 3. Do you have a family history of Colon Cancer or Polyps? no 4. Diabetes Mellitus? yes (type 2 ) 5. Joint replacements in the past 12 months?no 6. Major health problems in the past 3 months?no 7. Any artificial heart valves, MVP, or defibrillator?no    MEDICATIONS & ALLERGIES:    Patient reports the following regarding taking any anticoagulation/antiplatelet therapy:   Plavix, Coumadin, Eliquis, Xarelto, Lovenox, Pradaxa, Brilinta, or Effient? yes (eliquis) Aspirin? no  Patient confirms/reports the following medications:  Current Outpatient Medications  Medication Sig Dispense Refill   ELIQUIS 5 MG TABS tablet Take 1 tablet by mouth twice daily 60 tablet 1   empagliflozin (JARDIANCE) 10 MG TABS tablet Take 1 tablet (10 mg total) by mouth daily. 90 tablet 0   metFORMIN (GLUCOPHAGE) 500 MG tablet Take 2 tablets by mouth twice daily 360 tablet 1   metoprolol succinate (TOPROL-XL) 100 MG 24 hr tablet Take 1 tablet (100 mg total) by mouth daily. Take with or immediately following a meal. 90 tablet 3   metoprolol tartrate (LOPRESSOR) 25 MG tablet Take 1 tablet (25 mg total) by mouth as needed. Take for HR >100 60 tablet 3   rosuvastatin (CRESTOR) 10 MG tablet Take 1 tablet (10 mg total) by mouth once a week. 90 tablet 0   tamsulosin (FLOMAX) 0.4 MG CAPS capsule Take 1 capsule (0.4 mg total) by mouth daily. 90 capsule 3   No current facility-administered medications for this visit.    Patient confirms/reports the following allergies:  No Known Allergies  No orders of the defined types were placed in this encounter.   AUTHORIZATION INFORMATION Primary Insurance: 1D#: Group #:  Secondary  Insurance: 1D#: Group #:  SCHEDULE INFORMATION: Date:05/13/2021  Time: Location:[facility]

## 2021-04-01 NOTE — Telephone Encounter (Signed)
° °  Pre-operative Risk Assessment    Patient Name: Jeffrey Ortiz  DOB: [birth year] MRN: [id number]      Request for Surgical Clearance    Procedure:  Colonoscopy  Date of Surgery:  Clearance 05/13/21                                 Surgeon:  not listed Surgeon's Group or Practice Name:  [facility] Phone number:  [phone] Fax number:  [phone]   Type of Clearance Requested:  Pharmacy Eliquis    Type of Anesthesia:  Not Indicated   Additional requests/questions:    Signed, Pilar A Ham   04/01/2021, 3:35 PM

## 2021-04-02 ENCOUNTER — Other Ambulatory Visit: Payer: Medicaid Other

## 2021-04-03 ENCOUNTER — Ambulatory Visit: Payer: Self-pay | Admitting: Cardiovascular Disease

## 2021-04-08 ENCOUNTER — Other Ambulatory Visit: Payer: Self-pay | Admitting: Urology

## 2021-04-08 ENCOUNTER — Other Ambulatory Visit: Payer: Self-pay | Admitting: Family

## 2021-04-08 DIAGNOSIS — N138 Other obstructive and reflux uropathy: Secondary | ICD-10-CM

## 2021-04-08 DIAGNOSIS — E119 Type 2 diabetes mellitus without complications: Secondary | ICD-10-CM

## 2021-04-09 ENCOUNTER — Telehealth: Payer: Self-pay | Admitting: Family

## 2021-04-09 ENCOUNTER — Telehealth: Payer: Self-pay | Admitting: Urology

## 2021-04-09 ENCOUNTER — Other Ambulatory Visit: Payer: Self-pay | Admitting: Urology

## 2021-04-09 DIAGNOSIS — N138 Other obstructive and reflux uropathy: Secondary | ICD-10-CM

## 2021-04-09 NOTE — Telephone Encounter (Signed)
Prescription has been sent to the pharmacy.  Patient has been notified.   He expressed understanding and gratitude.

## 2021-04-09 NOTE — Telephone Encounter (Signed)
Patient called the office today because he is out of flomax for 2 days.  He is already experiencing some difficulty urinating.   I advised him that we have not seen him in a little over a year and he needs a follow up appointment.  We agreed on a date of 04/16/21 for an appointment with Dr. Diamantina Providence.   Patient is requesting a prescription for enough Flomax to be sent to the [facility] [address] to get him through until his appointment on 3/2.  Please advise.

## 2021-04-09 NOTE — Telephone Encounter (Signed)
I have spoken with patient as well as [facility]. PA should be generated & sent over to [place] to complete. I let patient know that this usually takes around 72 hours to hear from insurance company.

## 2021-04-09 NOTE — Telephone Encounter (Signed)
Pt called in stating that his insurance ids with a new company. Pt stated that he received a call from insurance stating that he needs to get authorization for medication (JARDIANCE 10 MG TABS tablet). Pt stated that he have one day left. Pt is requesting someone to contact his insurance. Pt requesting callback

## 2021-04-10 ENCOUNTER — Other Ambulatory Visit: Payer: Self-pay

## 2021-04-10 ENCOUNTER — Other Ambulatory Visit (INDEPENDENT_AMBULATORY_CARE_PROVIDER_SITE_OTHER): Payer: 59

## 2021-04-10 DIAGNOSIS — R899 Unspecified abnormal finding in specimens from other organs, systems and tissues: Secondary | ICD-10-CM | POA: Diagnosis not present

## 2021-04-10 DIAGNOSIS — E119 Type 2 diabetes mellitus without complications: Secondary | ICD-10-CM

## 2021-04-10 LAB — CBC WITH DIFFERENTIAL/PLATELET
Basophils Absolute: 0 10*3/uL (ref 0.0–0.1)
Basophils Relative: 0.6 % (ref 0.0–3.0)
Eosinophils Absolute: 0.2 10*3/uL (ref 0.0–0.7)
Eosinophils Relative: 2.3 % (ref 0.0–5.0)
HCT: 52.6 % — ABNORMAL HIGH (ref 39.0–52.0)
Hemoglobin: 17.7 g/dL — ABNORMAL HIGH (ref 13.0–17.0)
Lymphocytes Relative: 23.7 % (ref 12.0–46.0)
Lymphs Abs: 1.8 10*3/uL (ref 0.7–4.0)
MCHC: 33.7 g/dL (ref 30.0–36.0)
MCV: 95.1 fl (ref 78.0–100.0)
Monocytes Absolute: 0.7 10*3/uL (ref 0.1–1.0)
Monocytes Relative: 9.6 % (ref 3.0–12.0)
Neutro Abs: 4.8 10*3/uL (ref 1.4–7.7)
Neutrophils Relative %: 63.8 % (ref 43.0–77.0)
Platelets: 220 10*3/uL (ref 150.0–400.0)
RBC: 5.53 Mil/uL (ref 4.22–5.81)
RDW: 14 % (ref 11.5–15.5)
WBC: 7.6 10*3/uL (ref 4.0–10.5)

## 2021-04-10 LAB — COMPREHENSIVE METABOLIC PANEL
ALT: 14 U/L (ref 0–53)
AST: 15 U/L (ref 0–37)
Albumin: 4.4 g/dL (ref 3.5–5.2)
Alkaline Phosphatase: 98 U/L (ref 39–117)
BUN: 21 mg/dL (ref 6–23)
CO2: 28 mEq/L (ref 19–32)
Calcium: 9.1 mg/dL (ref 8.4–10.5)
Chloride: 100 mEq/L (ref 96–112)
Creatinine, Ser: 1.11 mg/dL (ref 0.40–1.50)
GFR: 70.24 mL/min (ref >60.00)
Glucose, Bld: 168 mg/dL — ABNORMAL HIGH (ref 70–99)
Potassium: 4.1 mEq/L (ref 3.5–5.1)
Sodium: 135 mEq/L (ref 135–145)
Total Bilirubin: 0.8 mg/dL (ref 0.2–1.2)
Total Protein: 6.7 g/dL (ref 6.0–8.3)

## 2021-04-10 LAB — MICROALBUMIN / CREATININE URINE RATIO
Creatinine,U: 75.9 mg/dL
Microalb Creat Ratio: 12.3 mg/g (ref 0.0–30.0)
Microalb, Ur: 9.3 mg/dL — ABNORMAL HIGH (ref 0.0–1.9)

## 2021-04-10 LAB — HEMOGLOBIN A1C: Hgb A1c MFr Bld: 7.1 % — ABNORMAL HIGH (ref 4.6–6.5)

## 2021-04-10 NOTE — Telephone Encounter (Signed)
LM that PA approved for Jardiance.

## 2021-04-10 NOTE — Telephone Encounter (Signed)
I have submitted PA & waiting response from insurance.

## 2021-04-10 NOTE — Telephone Encounter (Signed)
I spoke with patient & he is scheduled 3/10 to discuss his labs. He has read [employer].

## 2021-04-10 NOTE — Telephone Encounter (Signed)
Pt returning call. Advise Pt of below note. Pt would still like to speak with you about A1c. Pt requesting callback

## 2021-04-13 ENCOUNTER — Telehealth: Payer: Self-pay

## 2021-04-13 NOTE — Telephone Encounter (Signed)
PA through [place] for Jardiance approved 04/10/21-04/10/22.

## 2021-04-14 ENCOUNTER — Telehealth: Payer: Self-pay

## 2021-04-14 NOTE — Telephone Encounter (Signed)
Called patient to check on him make sure he knew of his upcoming apts to get the clearance he needs for the colonoscopy patient is aware and is gonna talk to his heart doctor at appointment

## 2021-04-15 ENCOUNTER — Telehealth: Payer: Self-pay

## 2021-04-15 ENCOUNTER — Other Ambulatory Visit: Payer: Self-pay | Admitting: Family

## 2021-04-15 DIAGNOSIS — E119 Type 2 diabetes mellitus without complications: Secondary | ICD-10-CM

## 2021-04-15 NOTE — Telephone Encounter (Signed)
I called patient back to let him know that we did have Jardiance samples & I was placing two boxes of the 10 mg tablets upfront for him to pick up until his insurance issues was straightened out. I asked that he call back & let us know he received the message. ?

## 2021-04-16 ENCOUNTER — Ambulatory Visit (INDEPENDENT_AMBULATORY_CARE_PROVIDER_SITE_OTHER): Payer: 59

## 2021-04-16 ENCOUNTER — Ambulatory Visit (INDEPENDENT_AMBULATORY_CARE_PROVIDER_SITE_OTHER): Payer: 59 | Admitting: Urology

## 2021-04-16 ENCOUNTER — Encounter: Payer: Self-pay | Admitting: Urology

## 2021-04-16 ENCOUNTER — Other Ambulatory Visit: Payer: Self-pay

## 2021-04-16 VITALS — BP 152/105 | HR 91 | Ht 72.0 in | Wt 205.0 lb

## 2021-04-16 DIAGNOSIS — Z7901 Long term (current) use of anticoagulants: Secondary | ICD-10-CM | POA: Diagnosis not present

## 2021-04-16 DIAGNOSIS — N401 Enlarged prostate with lower urinary tract symptoms: Secondary | ICD-10-CM | POA: Diagnosis not present

## 2021-04-16 DIAGNOSIS — N138 Other obstructive and reflux uropathy: Secondary | ICD-10-CM

## 2021-04-16 DIAGNOSIS — Z125 Encounter for screening for malignant neoplasm of prostate: Secondary | ICD-10-CM

## 2021-04-16 LAB — BLADDER SCAN AMB NON-IMAGING

## 2021-04-16 MED ORDER — TAMSULOSIN HCL 0.4 MG PO CAPS: 0.4000 mg | ORAL_CAPSULE | Freq: Every day | ORAL | 11 refills | Status: DC

## 2021-04-16 NOTE — Progress Notes (Signed)
? ?  04/16/2021 ?5:31 PM  ? ?Jeffrey Ortiz ?[birth year] ?[id number] ? ?Reason for visit: Follow up BPH, elevated PSA ? ?HPI: ?I saw Jeffrey Ortiz today for the above issues.  I last saw him in February 2022 for his worsening urinary symptoms and BPH and elevated PVRs of ~275mL, and cystoscopy/TRUS showed an enlarged prostate with a high bladder neck and significant intravesical protrusion, and prostate measured 85 g on TRUS.  At that point he was interested in pursuing HOLEP, but he never called to schedule a follow-up and opted for continuing the Flomax alone.  It sounds like he is done relatively well over the last year and is urinating well, PVR today has improved to 85 mL.  He also has a mildly elevated PSA of 4.8, with very reassuring > 20% free which is most likely secondary to his enlarged prostate.  PSA density is also very reassuring at 0.06. ? ?We again reviewed options for his BPH including continuing Flomax, addition of finasteride, or considering outlet procedure with HOLEP.  He satisfied with his urinary symptoms at this time and would like to continue Flomax.  Return precautions discussed extensively including urinary retention, gross hematuria, UTIs, or worsening urinary symptoms. ? ?Flomax refilled ?RTC 1 year IPSS, PVR, PSA reflex to free prior ? ? ?[name], MD ? ?[facility] ?[address], [address] ?[place], [place] [address] ?[phone] ? ? ?

## 2021-04-16 NOTE — Patient Instructions (Addendum)
Benign Prostatic Hyperplasia Benign prostatic hyperplasia (BPH) is an enlarged prostate gland that is caused by the normal aging process. The prostate may get bigger as a man gets older. The condition is not caused by cancer. The prostate is a walnut-sized gland that is involved in the production of semen. It is located in front of the rectum and below the bladder. The bladder stores urine. The urethra carries stored urine out of the body. An enlarged prostate can press on the urethra. This can make it harder to pass urine. The buildup of urine in the bladder can cause infection. Back pressure and infection may progress to bladder damage and kidney (renal) failure. What are the causes? This condition is part of the normal aging process. However, not all men develop problems from this condition. If the prostate enlarges away from the urethra, urine flow will not be blocked. If it enlarges toward the urethra and compresses it, there will be problems passing urine. What increases the risk? This condition is more likely to develop in men older than 50 years. What are the signs or symptoms? Symptoms of this condition include: Getting up often during the night to urinate. Needing to urinate frequently during the day. Difficulty starting urine flow. Decrease in size and strength of your urine stream. Leaking (dribbling) after urinating. Inability to pass urine. This needs immediate treatment. Inability to completely empty your bladder. Pain when you pass urine. This is more common if there is also an infection. Urinary tract infection (UTI). How is this diagnosed? This condition is diagnosed based on your medical history, a physical exam, and your symptoms. Tests will also be done, such as: A post-void bladder scan. This measures any amount of urine that may remain in your bladder after you finish urinating. A digital rectal exam. In a rectal exam, your health care provider checks your prostate by  putting a lubricated, gloved finger into your rectum to feel the back of your prostate gland. This exam detects the size of your gland and any abnormal lumps or growths. An exam of your urine (urinalysis). A prostate specific antigen (PSA) screening. This is a blood test used to screen for prostate cancer. An ultrasound. This test uses sound waves to electronically produce a picture of your prostate gland. Your health care provider may refer you to a specialist in kidney and prostate diseases (urologist). How is this treated? Once symptoms begin, your health care provider will monitor your condition (active surveillance or watchful waiting). Treatment for this condition will depend on the severity of your condition. Treatment may include: Observation and yearly exams. This may be the only treatment needed if your condition and symptoms are mild. Medicines to relieve your symptoms, including: Medicines to shrink the prostate. Medicines to relax the muscle of the prostate. Surgery in severe cases. Surgery may include: Prostatectomy. In this procedure, the prostate tissue is removed completely through an open incision or with a laparoscope or robotics. Transurethral resection of the prostate (TURP). In this procedure, a tool is inserted through the opening at the tip of the penis (urethra). It is used to cut away tissue of the inner core of the prostate. The pieces are removed through the same opening of the penis. This removes the blockage. Transurethral incision (TUIP). In this procedure, small cuts are made in the prostate. This lessens the prostate's pressure on the urethra. Transurethral microwave thermotherapy (TUMT). This procedure uses microwaves to create heat. The heat destroys and removes a small amount of prostate  tissue. Transurethral needle ablation (TUNA). This procedure uses radio frequencies to destroy and remove a small amount of prostate tissue. Interstitial laser coagulation (Wann).  This procedure uses a laser to destroy and remove a small amount of prostate tissue. Transurethral electrovaporization (TUVP). This procedure uses electrodes to destroy and remove a small amount of prostate tissue. Prostatic urethral lift. This procedure inserts an implant to push the lobes of the prostate away from the urethra. Follow these instructions at home: Take over-the-counter and prescription medicines only as told by your health care provider. Monitor your symptoms for any changes. Contact your health care provider with any changes. Avoid drinking large amounts of liquid before going to bed or out in public. Avoid or reduce how much caffeine or alcohol you drink. Give yourself time when you urinate. Keep all follow-up visits. This is important. Contact a health care provider if: You have unexplained back pain. Your symptoms do not get better with treatment. You develop side effects from the medicine you are taking. Your urine becomes very dark or has a bad smell. Your lower abdomen becomes distended and you have trouble passing urine. Get help right away if: You have a fever or chills. You suddenly cannot urinate. You feel light-headed or very dizzy, or you faint. There are large amounts of blood or clots in your urine. Your urinary problems become hard to manage. You develop moderate to severe low back or flank pain. The flank is the side of your body between the ribs and the hip. These symptoms may be an emergency. Get help right away. Call 911. Do not wait to see if the symptoms will go away. Do not drive yourself to the hospital. Summary Benign prostatic hyperplasia (BPH) is an enlarged prostate that is caused by the normal aging process. It is not caused by cancer. An enlarged prostate can press on the urethra. This can make it hard to pass urine. This condition is more likely to develop in men older than 50 years. Get help right away if you suddenly cannot urinate. This  information is not intended to replace advice given to you by your health care provider. Make sure you discuss any questions you have with your health care provider. Document Revised: 08/20/2020 Document Reviewed: 08/20/2020 Elsevier Patient Education  [place] Laser Enucleation of the Prostate (HoLEP)  HoLEP is a treatment for men with benign prostatic hyperplasia (BPH). The laser surgery removed blockages of urine flow, and is done without any incisions on the body.     What is HoLEP?  HoLEP is a type of laser surgery used to treat obstruction (blockage) of urine flow as a result of benign prostatic hyperplasia (BPH). In men with BPH, the prostate gland is not cancerous, but has become enlarged. An enlarged prostate can result in a number of urinary tract symptoms such as weak urinary stream, difficulty in starting urination, inability to urinate, frequent urination, or getting up at night to urinate.  HoLEP was developed in the 1990's as a more effective and less expensive surgical option for BPH, compared to other surgical options such as laser vaporization(PVP/greenlight laser), transurethral resection of the prostate(TURP), and open simple prostatectomy.   What happens during a HoLEP?  HoLEP requires general anesthesia (asleep throughout the procedure).   An antibiotic is given to reduce the risk of infection  A surgical instrument called a resectoscope is inserted through the urethra (the tube that carries urine from the bladder). The resectoscope has a  camera that allows the surgeon to view the internal structure of the prostate gland, and to see where the incisions are being made during surgery.  The laser is inserted into the resectoscope and is used to enucleate (free up) the enlarged prostate tissue from the capsule (outer shell) and then to seal up any blood vessels. The tissue that has been removed is pushed back into the bladder.  A morcellator is  placed through the resectoscope, and is used to suction out the prostate tissue that has been pushed into the bladder.  When the prostate tissue has been removed, the resectoscope is removed, and a foley catheter is placed to allow healing and drain the urine from the bladder.     What happens after a HoLEP?  More than 90% of patients go home the same day a few hours after surgery. Less than 10% will be admitted to the hospital overnight for observation to monitor the urine, or if they have other medical problems.  Fluid is flushed through the catheter for about 1 hour after surgery to clear any blood from the urine. It is normal to have some blood in the urine after surgery. The need for blood transfusion is extremely rare.  Eating and drinking are permitted after the procedure once the patient has fully awakened from anesthesia.  The catheter is usually removed 2-3 days after surgery- the patient will come to clinic to have the catheter removed and make sure they can urinate on their own.  It is very important to drink lots of fluids after surgery for one week to keep the bladder flushed.  At first, there may be some burning with urination, but this typically improved within a few hours to days. Most patients do not have a significant amount of pain, and narcotic pain medications are rarely needed.  Symptoms of urinary frequency, urgency, and even leakage are NORMAL for the first few weeks after surgery as the bladder adjusts after having to work hard against blockage from the prostate for many years. This will improve, but can sometimes take several months.  The use of pelvic floor exercises (Kegel exercises) can help improve problems with urinary incontinence.   After catheter removal, patients will be seen at 6 weeks and 6 months for symptom check  No heavy lifting for at least 2-3 weeks after surgery, however patients can walk and do light activities the first day after surgery.  Return to work time depends on occupation.    What are the advantages of HoLEP?  HoLEP has been studied in many different parts of the world and has been shown to be a safe and effective procedure. Although there are many types of BPH surgeries available, HoLEP offers a unique advantage in being able to remove a large amount of tissue without any incisions on the body, even in very large prostates, while decreasing the risk of bleeding and providing tissue for pathology (to look for cancer). This decreases the need for blood transfusions during surgery, minimizes hospital stay, and reduces the risk of needing repeat treatment.  What are the side effects of HoLEP?  Temporary burning and bleeding during urination. Some blood may be seen in the urine for weeks after surgery and is part of the healing process.  Urinary incontinence (inability to control urine flow) is expected in all patients immediately after surgery and they should wear pads for the first few days/weeks. This typically improves over the course of several weeks. Performing Kegel exercises can help  decrease leakage from stress maneuvers such as coughing, sneezing, or lifting. The rate of long term leakage is very low. Patients may also have leakage with urgency and this may be treated with medication. The risk of urge incontinence can be dependent on several factors including age, prostate size, symptoms, and other medical problems.  Retrograde ejaculation or backwards ejaculation. In 75% of cases, the patient will not see any fluid during ejaculation after surgery.  Erectile function is generally not significantly affected.   What are the risks of HoLEP?  Injury to the urethra or development of scar tissue at a later date  Injury to the capsule of the prostate (typically treated with longer catheterization).  Injury to the bladder or ureteral orifices (where the urine from the kidney drains out)  Infection of the bladder,  testes, or kidneys  Return of urinary obstruction at a later date requiring another operation (<2%)  Need for blood transfusion or re-operation due to bleeding  Failure to relieve all symptoms and/or need for prolonged catheterization after surgery  5-15% of patients are found to have previously undiagnosed prostate cancer in their specimen. Prostate cancer can be treated after HoLEP.  Standard risks of anesthesia including blood clots, heart attacks, etc  When should I call my doctor?  Fever over 101.3 degrees  Inability to urinate, or large blood clots in the urine

## 2021-04-16 NOTE — Telephone Encounter (Signed)
Pt called about medication and I read the message to him. ?

## 2021-04-17 LAB — ECHOCARDIOGRAM COMPLETE
Area-P 1/2: 4.8 cm2
Calc EF: 38.9 %
Height: 72 in
MV M vel: 4.59 m/s
MV Peak grad: 84.3 mmHg
S' Lateral: 4.8 cm
Single Plane A2C EF: 27.1 %
Single Plane A4C EF: 45.6 %
Weight: 3280 oz

## 2021-04-21 ENCOUNTER — Telehealth: Payer: Self-pay | Admitting: *Deleted

## 2021-04-21 NOTE — Telephone Encounter (Signed)
-----   Message from Theora Gianotti, NP sent at 04/20/2021  9:12 AM EST ----- ?Significant reduced heart squeezing function @ 25-30%, similar to what was seen in 07/2015, but down from 10/2015 study.  The mitral valve is mildly leaky.  F/u with Dr. Rockey Situ as planned on 3/10, to discuss further. ?

## 2021-04-21 NOTE — Telephone Encounter (Signed)
Reviewed results and recommendations with patient. He confirmed appointment with no further questions at this time.  ?

## 2021-04-21 NOTE — Telephone Encounter (Signed)
Left voicemail message to call back for review of results and recommendations.  

## 2021-04-24 ENCOUNTER — Other Ambulatory Visit: Payer: Self-pay

## 2021-04-24 ENCOUNTER — Telehealth: Payer: Self-pay

## 2021-04-24 ENCOUNTER — Encounter: Payer: Self-pay | Admitting: Cardiovascular Disease

## 2021-04-24 ENCOUNTER — Ambulatory Visit: Payer: 59 | Admitting: Cardiovascular Disease

## 2021-04-24 ENCOUNTER — Telehealth (INDEPENDENT_AMBULATORY_CARE_PROVIDER_SITE_OTHER): Payer: 59 | Admitting: Family

## 2021-04-24 VITALS — BP 160/100 | HR 135 | Ht 72.0 in | Wt 201.0 lb

## 2021-04-24 DIAGNOSIS — E785 Hyperlipidemia, unspecified: Secondary | ICD-10-CM

## 2021-04-24 DIAGNOSIS — I4891 Unspecified atrial fibrillation: Secondary | ICD-10-CM

## 2021-04-24 DIAGNOSIS — I5032 Chronic diastolic (congestive) heart failure: Secondary | ICD-10-CM | POA: Diagnosis not present

## 2021-04-24 DIAGNOSIS — Z8669 Personal history of other diseases of the nervous system and sense organs: Secondary | ICD-10-CM

## 2021-04-24 DIAGNOSIS — Z8616 Personal history of COVID-19: Secondary | ICD-10-CM

## 2021-04-24 DIAGNOSIS — I4892 Unspecified atrial flutter: Secondary | ICD-10-CM

## 2021-04-24 DIAGNOSIS — I480 Paroxysmal atrial fibrillation: Secondary | ICD-10-CM | POA: Diagnosis not present

## 2021-04-24 DIAGNOSIS — I10 Essential (primary) hypertension: Secondary | ICD-10-CM

## 2021-04-24 DIAGNOSIS — E119 Type 2 diabetes mellitus without complications: Secondary | ICD-10-CM

## 2021-04-24 DIAGNOSIS — I483 Typical atrial flutter: Secondary | ICD-10-CM

## 2021-04-24 MED ORDER — POTASSIUM CHLORIDE CRYS ER 20 MEQ PO TBCR: 20.0000 meq | EXTENDED_RELEASE_TABLET | Freq: Every day | ORAL | 3 refills | Status: DC

## 2021-04-24 MED ORDER — ROSUVASTATIN CALCIUM 10 MG PO TABS
10.0000 mg | ORAL_TABLET | ORAL | 0 refills | Status: DC
Start: 2021-04-24 — End: 2022-05-04

## 2021-04-24 MED ORDER — LOSARTAN POTASSIUM 25 MG PO TABS: 25.0000 mg | ORAL_TABLET | Freq: Every day | ORAL | 1 refills | Status: DC

## 2021-04-24 MED ORDER — BLOOD GLUCOSE MONITOR KIT: PACK | 0 refills | Status: AC

## 2021-04-24 MED ORDER — FUROSEMIDE 40 MG PO TABS
40.0000 mg | ORAL_TABLET | Freq: Every day | ORAL | 3 refills | Status: DC
Start: 2021-04-24 — End: 2021-06-04

## 2021-04-24 MED ORDER — DAPAGLIFLOZIN PROPANEDIOL 5 MG PO TABS: 5.0000 mg | ORAL_TABLET | Freq: Every day | ORAL | 1 refills | Status: DC

## 2021-04-24 MED ORDER — METOPROLOL SUCCINATE ER 100 MG PO TB24: 100.0000 mg | ORAL_TABLET | Freq: Two times a day (BID) | ORAL | 3 refills | Status: DC

## 2021-04-24 NOTE — Addendum Note (Signed)
Addended by: [place], Tayja Manzer on: 04/24/2021 01:27 PM ? ? Modules accepted: Orders ? ?

## 2021-04-24 NOTE — Assessment & Plan Note (Signed)
Uncontrolled.  LDL 132.  He is agreeable to increasing Crestor '10mg'$   from weekly to every other day with goal to take daily.  ?

## 2021-04-24 NOTE — Progress Notes (Signed)
Jardiance not covered anymore through insurance ?

## 2021-04-24 NOTE — Progress Notes (Signed)
Virtual Visit via Video Note ? ?I connected with@ ? on 04/24/21 at 11:30 AM EST by a video enabled telemedicine application and verified that I am speaking with the correct person using two identifiers. ? Location patient: home ?Location provider:work  ?Persons participating in the virtual visit: patient, provider ? ?I discussed the limitations of evaluation and management by telemedicine and the availability of in person appointments. The patient expressed understanding and agreed to proceed. ? ? ?HPI: ?Feels well today.  No new complaints ? ?Follow up with Dr Rockey Situ to discuss echocardiogram ?Drinks water, powerade zero. Notes some indiscretion with potatoes.  ? ?DM- insurance is not covering  jardiance. He is compliant with metformin '1000mg'$  BID.  ?HTN- He is no longer on lisinopril 40 mg qd and is unclear why this was stopped.  He does not recall any side effects from this medication.  he uses metoprolol tartrate 25 prn ? ?Hyperlipidemia-compliant with crestor '10mg'$  once weekly,. ? ? ?A1c 7.1 ?Increase in microalbuminuria ? ? ?LDL 132 10/21/20 ? ? ?Due eye exam ? ? ? ? ?ROS: See pertinent positives and negatives per HPI. ? ? ? ?EXAM: ? ?VITALS per patient if applicable: ?There were no vitals taken for this visit. ?BP Readings from Last 3 Encounters:  ?04/16/21 (!) 152/105  ?11/20/20 138/70  ?10/21/20 110/80  ? ?Wt Readings from Last 3 Encounters:  ?04/16/21 205 lb (93 kg)  ?11/20/20 200 lb 6 oz (90.9 kg)  ?10/21/20 193 lb 8 oz (87.8 kg)  ? ? ?GENERAL: alert, oriented, appears well and in no acute distress ? ?HEENT: atraumatic, conjunttiva clear, no obvious abnormalities on inspection of external nose and ears ? ?NECK: normal movements of the head and neck ? ?LUNGS: on inspection no signs of respiratory distress, breathing rate appears normal, no obvious gross SOB, gasping or wheezing ? ?CV: no obvious cyanosis ? ?MS: moves all visible extremities without noticeable abnormality ? ?PSYCH/NEURO: pleasant and  cooperative, no obvious depression or anxiety, speech and thought processing grossly intact ? ?ASSESSMENT AND PLAN: ? ?Discussed the following assessment and plan: ? ?Problem List Items Addressed This Visit   ? ?  ? Cardiovascular and Mediastinum  ? Essential hypertension (Chronic)  ?  Microalbuminuria.  Previously on lisinopril and stopped for unknown reason.  History of heart failure.  He is seeing Dr. Rockey Situ today.  Advised to start losartan '25mg'$  unless Dr. Rockey Situ makes otherwise suggestion. BMP lab in one week ?  ?  ? Relevant Medications  ? losartan (COZAAR) 25 MG tablet  ? rosuvastatin (CRESTOR) 10 MG tablet  ? Other Relevant Orders  ? Basic metabolic panel  ?  ? Endocrine  ? DM (diabetes mellitus), type 2 ([place]) - Primary  ?  Lab Results  ?Component Value Date  ? HGBA1C 7.1 (H) 04/10/2021  ?Uncontrolled.  Continue metformin '1000mg'$  BID.  Jardiance not covered by his insurance.  I have sent in [place] 5 mg.  He will start checking blood sugars at home.  Provided low glycemic diet ?  ?  ? Relevant Medications  ? dapagliflozin propanediol (FARXIGA) 5 MG TABS tablet  ? losartan (COZAAR) 25 MG tablet  ? rosuvastatin (CRESTOR) 10 MG tablet  ? Other Relevant Orders  ? CBC with Differential/Platelet  ? Basic metabolic panel  ?  ? Other  ? Hyperlipidemia  ?  Uncontrolled.  LDL 132.  He is agreeable to increasing Crestor '10mg'$   from weekly to every other day with goal to take daily.  ?  ?  ?  Relevant Medications  ? losartan (COZAAR) 25 MG tablet  ? rosuvastatin (CRESTOR) 10 MG tablet  ? ? ?-we discussed possible serious and likely etiologies, options for evaluation and workup, limitations of telemedicine visit vs in person visit, treatment, treatment risks and precautions. Pt prefers to treat via telemedicine empirically rather then risking or undertaking an in person visit at this moment.  ?. ?  ?I discussed the assessment and treatment plan with the patient. The patient was provided an opportunity to ask questions and  all were answered. The patient agreed with the plan and demonstrated an understanding of the instructions. ?  ?The patient was advised to call back or seek an in-person evaluation if the symptoms worsen or if the condition fails to improve as anticipated. ? ? ?[name], FNP  ?

## 2021-04-24 NOTE — Patient Instructions (Addendum)
Call the office to schedule non fasting labs in a week and also ensure to schedule a 9-monthfollow-up ? ?Start losartan '25mg'$  (to protect kidneys, lower protein in urine) ? ?Start farxiga '5mg'$  ( we may increase at follow-up) ? ?Please increase Crestor to every other day as goal of LDL cholesterol is less than 70 ? ?I have also sent in glucometer to check blood sugar ? ?Goal of fasting blood sugar is between 70-120. If in this range, we are reaching our target a1c ( goal 6.5%)  ? ?Please check fasting blood sugar in the morning time once or twice a week.  You may also check if you feel like you are having a low episode or particularly high episode of blood sugar. ? ?If blood sugars increase, I may advise you to check blood sugar after your largest meal.  You specifically do this TWO hours after largest meal with the goal of being less than 180.  If blood sugar is checked sooner than 2 hours after largest meal, and it will be  expected to be elevated. You must wait 2 hours.  ? ?If your blood sugar is less than 180 hours after your largest meal, again we are reaching our target a1c goal  ? ?[facility] < 70 or > 300. ? ? If you have any symptoms of low blood sugar ( sweating, shakiness, lightheaded, dizzy) that you notify me. If you have a low, please drink a glass of orange juice and recheck blood sugar every 5 minutes until you don?t feel symptomatic AND blood sugar is above 80.  ? ? ?This is  Dr. TLupita Dawn example of a  "Low GI"  Diet:  It will allow you to lose 4 to 8  lbs  per month if you follow it carefully.  Your goal with exercise is a minimum of 30 minutes of aerobic exercise 5 days per week (Walking does not count once it becomes easy!)  ? ? All of the foods can be found at grocery stores and in bulk at [employer]  The Atkins protein bars and shakes are available in more varieties at [employer], [employer] and [employer]   ? ? ?7 AM Breakfast:  Choose from the following: ? Low  carbohydrate Protein  Shakes (I recommend the  Premier Protein chocolate shakes,  EAS AdvantEdge "Carb Control" shakes  Or the Atkins shakes all are under 3 net carbs)  ?   a scrambled egg/bacon/cheese burrito made with [employer]'s "carb balance" whole wheat tortilla  (about 10 net carbs ) ? [employer] De[employer] sells mPhysicist, medical(basically a quiche without the pastry crust) that is eaten cold and very convenient way to get your eggs.  8 carbs) ? If you make your own protein shakes, avoid bananas and pineapple,  And use low carb greek yogurt or original /unsweetened almond or soy milk  ? ? Avoid cereal and bananas, oatmeal and cream of wheat and grits. They are loaded with carbohydrates! ? ? ?10 AM: high protein snack: ? Protein bar by Atkins (the snack size, under 200 cal, usually < 6 net carbs).   ? A stick of cheese:  Around 1 carb,  100 cal    ? Dannon Light n Fit Greek Yogurt  (80 cal, 8 carbs)  ?Other so called "protein bars" and Greek yogurts tend to be loaded with carbohydrates.  Remember, in food advertising, the word "energy" is synonymous for " carbohydrate." ? ?Lunch:  ?  A Sandwich using the bread choices listed, Can use any  Eggs,  lunchmeat, grilled meat or canned tuna), avocado, regular mayo/mustard  and cheese. ? A Salad using blue cheese, ranch,  Goddess or vinagrette,  Avoid taco shells, croutons or "confetti" and no "candied nuts" but regular nuts OK.  ? No pretzels, nabs  or chips.  Pickles and miniature sweet peppers are a good low carb alternative that provide a "crunch" ? The bread is the only source of carbohydrate in a sandwich and  can be decreased by trying some of the attached alternatives to traditional loaf bread ?  ?Avoid "Low fat dressings, as well as Tightwad dressings They are loaded with sugar! ? ? ?3 PM/ Mid day  Snack: ? Consider  1 ounce of  almonds, walnuts, pistachios, pecans, peanuts,  Macadamia nuts or a nut medley. ? Avoid "granola and granola bars " ?  Mixed nuts are ok in moderation as long as there are no raisins,  cranberries or dried fruit.  ? KIND bars are OK if you get the low glycemic index variety  ? Try the prosciutto/mozzarella cheese sticks by [employer]  In deli /backery section   High protein  ?  ?  ?6 PM  Dinner:   ?  Meat/fowl/fish with a green salad, and either broccoli, cauliflower, green beans, spinach, brussel sprouts or  Lima beans. DO NOT BREAD THE PROTEIN!!   ?   There is a low carb pasta by Dreamfield's that is acceptable and tastes great: only 5 digestible carbs/serving.( All grocery stores but BJs carry it ) ?Several ready made meals are available low carb:  ? Try Michel Angelo's chicken piccata or chicken or eggplant parm over low carb pasta.(Lowes and BJs)  ? Marjory Lies Sanchez's "Carnitas" (pulled pork, no sauce,  0 carbs) or his beef pot roast to make a dinner burrito (at [employer]) ? Pesto over low carb pasta (bj's sells a good quality pesto in the center refrigerated section of the deli  ? Try satueeing  Cheral Marker with mushroooms as a good side  ? [employer] makes a mashed cauliflower that tastes like mashed potatoes ? ?Whole wheat pasta is still full of digestible carbs and  Not as low in glycemic index as Dreamfield's.   ?Brown rice is still rice,  So skip the rice and noodles if you eat [place] or [place] (or at least limit to 1/2 cup) ? ?9 PM snack :  ? Breyer's "low carb" fudgsicle or  ice cream bar (Carb Smart line), or  Weight Watcher's ice cream bar , or another "no sugar added" ice cream; ? a serving of fresh berries/cherries with whipped cream  ? Cheese or DANNON'S LlGHT N FIT GREEK YOGURT ? 8 ounces of [place] unsweetened almond/cococunut milk   ? Treat yourself to a parfait made with whipped cream blueberiies, walnuts and vanilla greek yogurt ? ?Avoid bananas, pineapple, grapes  and watermelon on a regular basis because they are high in sugar.  THINK OF THEM AS DESSERT ? ?Remember that snack Substitutions should be less than 10 NET  carbs per serving and meals < 20 carbs. Remember to subtract fiber grams to get the "net carbs." ? ? ? ?

## 2021-04-24 NOTE — Assessment & Plan Note (Signed)
Microalbuminuria.  Previously on lisinopril and stopped for unknown reason.  History of heart failure.  He is seeing Dr. Rockey Situ today.  Advised to start losartan '25mg'$  unless Dr. Rockey Situ makes otherwise suggestion. BMP lab in one week ?

## 2021-04-24 NOTE — Patient Instructions (Addendum)
Will have scheduling make an appt with EP (cardiac  electrophysiologist) ? ?Medication Instructions:  ?Please increase  ?metoprolol succinate up to 100 mg twice a day ? ?Please start lasix /furosemide  ?40 mg daily with potassium 20 meq daily ? ? ?If you need a refill on your cardiac medications before your next appointment, please call your pharmacy.  ? ?Lab work: ?No new labs needed ? ?Testing/Procedures: ?No new testing needed ? ?Follow-Up: ?At [facility], you and your health needs are our priority.  As part of our continuing mission to provide you with exceptional heart care, we have created designated Provider Care Teams.  These Care Teams include your primary Cardiologist (physician) and Advanced Practice Providers (APPs -  Physician Assistants and Nurse Practitioners) who all work together to provide you with the care you need, when you need it. ? ?You will need a follow up appointment in 1 month ? ?Providers on your designated Care Team:   ?Murray Hodgkins, NP ?Christell Faith, PA-C ?Cadence Kathlen Mody, PA-C ? ?COVID-19 Vaccine Information can be found at: [email or web address].[email or web address] For questions related to vaccine distribution or appointments, please email [email or web address] or call [phone].  ? ?

## 2021-04-24 NOTE — Telephone Encounter (Signed)
Per secure chat from [name], NP ? ?"would you send glucometer, lancets, strips for pt? he also needs eye exam. would you call him and see if he has had done ( then we need to get record). If he needs referral to ophthalmology for DM eye exam, please place" ? ? ?Glucometer sent to pharmacy ? ? ? ? ?

## 2021-04-24 NOTE — Assessment & Plan Note (Signed)
Lab Results  ?Component Value Date  ? HGBA1C 7.1 (H) 04/10/2021  ? ?Uncontrolled.  Continue metformin '1000mg'$  BID.  Jardiance not covered by his insurance.  I have sent in [place] 5 mg.  He will start checking blood sugars at home.  Provided low glycemic diet ?

## 2021-04-24 NOTE — Progress Notes (Signed)
Patient ID: Jeffrey Ortiz, male   DOB: Apr 27, 1955, 66 y.o.   MRN: [id number] ?Cardiology Office Note ? ?Date:  04/24/2021  ? ?ID:  Jeffrey Ortiz, DOB 1955-03-15, MRN [id number] ? ?PCP:  [name], FNP  ? ?Chief Complaint  ?Patient presents with  ? Follow up Echo  ?  "Doing well." Medications reviewed by the patient verbally.   ? ? ?HPI:  ?Jeffrey Ortiz is a 9 ear-old gentleman with a history of  ?GERD,  ?diabetes, hypertension, hyperlipidemia,  ?hospital 08/11/2015 with worsening shortness of breath, orthopnea, PND ?after severe GERD, vomiting, aspiration into his sinuses ?noted to be in atrial flutter with RVR ?underwent TEE with cardioversion ?Cardioversion was initially successful though converted to atrial fibrillation ?Later back on the floor he went back into atrial flutter ?Atrial flutter-recurrent-typical  S/p ablation  ?Last seen in clinic 2017 ?He presents for follow-up of his atrial fibrillation, persistent ? ?LOV 9/22 ? ?Recap of recent events ?Covid 07/25/2020 ? ?EKG September 2022 noted to be in atrial fibrillation heart rate 122 in the office ?At that time started on Eliquis, metoprolol  ? ?Seen in clinic November 20, 2020,, heart rate 126 bpm ?Reported he was taking his metoprolol succinate 100 daily, ?Echo ordered ? ?We received a request for colonoscopy clearance, we recommended he follow-up in the office first given elevated heart rate ? ?On today's visit reports he has not taken his medications this morning, was rushing into the office ?EKG showing atrial fibrillation rate 135 bpm nonspecific ST and T wave abnormality anterolateral leads ? ? ?PMH:   has a past medical history of Atrial flutter ([place]), Chronic systolic CHF (congestive heart failure) ([place]), Essential hypertension, Hyperlipidemia, Hypertension, Overweight, Sleep apnea, and Type II diabetes mellitus ([place]). ? ?PSH:    ?Past Surgical History:  ?Procedure Laterality Date  ? COLONOSCOPY WITH PROPOFOL N/A 08/10/2018  ? Procedure:  COLONOSCOPY WITH PROPOFOL;  Surgeon: Virgel Manifold, MD;  Location: [facility] ENDOSCOPY;  Service: Endoscopy;  Laterality: N/A;  ? ELECTROPHYSIOLOGIC STUDY N/A 09/26/2015  ? Procedure: A-Flutter;  Surgeon: Deboraha Sprang, MD;  Location: [place] [facility];  Service: Cardiovascular;  Laterality: N/A;  ? TEE WITHOUT CARDIOVERSION N/A 08/13/2015  ? Procedure: TRANSESOPHAGEAL ECHOCARDIOGRAM (TEE) and cardioversion;  Surgeon: Minna Merritts, MD;  Location: [facility] ORS;  Service: Cardiovascular;  Laterality: N/A;  ? ? ?Current Outpatient Medications  ?Medication Sig Dispense Refill  ? blood glucose meter kit and supplies KIT Use to check blood sugar once daily Dispense based on patient and insurance preference. 1 each 0  ? ELIQUIS 5 MG TABS tablet Take 1 tablet by mouth twice daily 60 tablet 1  ? empagliflozin (JARDIANCE) 10 MG TABS tablet Take 10 mg by mouth daily.    ? furosemide (LASIX) 40 MG tablet Take 1 tablet (40 mg total) by mouth daily. 90 tablet 3  ? metFORMIN (GLUCOPHAGE) 500 MG tablet Take 2 tablets by mouth twice daily 360 tablet 1  ? metoprolol tartrate (LOPRESSOR) 25 MG tablet Take 1 tablet (25 mg total) by mouth as needed. Take for HR >100 60 tablet 3  ? potassium chloride SA (KLOR-CON M) 20 MEQ tablet Take 1 tablet (20 mEq total) by mouth daily. 90 tablet 3  ? rosuvastatin (CRESTOR) 10 MG tablet Take 1 tablet (10 mg total) by mouth every other day. 90 tablet 0  ? tamsulosin (FLOMAX) 0.4 MG CAPS capsule Take 1 capsule (0.4 mg total) by mouth daily. 30 capsule 11  ? dapagliflozin propanediol (FARXIGA) 5  MG TABS tablet Take 1 tablet (5 mg total) by mouth daily before breakfast. (Patient not taking: Reported on 04/24/2021) 90 tablet 1  ? metoprolol succinate (TOPROL-XL) 100 MG 24 hr tablet Take 1 tablet (100 mg total) by mouth 2 (two) times daily. 90 tablet 3  ? ?No current facility-administered medications for this visit.  ? ? ? ?Allergies:   Patient has no known allergies.  ? ?Social History:  The patient   reports that he has never smoked. He has never been exposed to tobacco smoke. He has never used smokeless tobacco. He reports that he does not drink alcohol and does not use drugs.  ? ?Family History:   family history includes Cancer in his father; Diabetes in his brother; Heart attack in his brother and father; Other (age of onset: 20) in his mother.  ? ? ?Review of Systems: ?Review of Systems  ?Constitutional: Negative.   ?HENT: Negative.    ?Respiratory: Negative.    ?Cardiovascular: Negative.   ?Gastrointestinal: Negative.   ?Musculoskeletal: Negative.   ?Neurological: Negative.   ?Psychiatric/Behavioral: Negative.    ?All other systems reviewed and are negative. ? ? ?PHYSICAL EXAM: ?VS:  BP (!) 160/100 (BP Location: Left Arm, Patient Position: Sitting, Cuff Size: Normal)   Pulse (!) 135   Ht 6' (1.829 m)   Wt 201 lb (91.2 kg)   SpO2 98%   BMI 27.26 kg/m?  , BMI Body mass index is 27.26 kg/m?Marland Kitchen ?Constitutional:  oriented to person, place, and time.  Mild shortness of breath ?HENT:  ?Head: Grossly normal ?Eyes:  no discharge. No scleral icterus.  ?Neck: No JVD, no carotid bruits  ?Cardiovascular: Rapid, irregularly irregular no murmurs appreciated ?Mild lower extremity edema bilateral 1+ pitting ?Pulmonary/Chest: Clear to auscultation bilaterally, no wheezes or rails ?Abdominal: Soft.  no distension.  no tenderness.  ?Musculoskeletal: Normal range of motion ?Neurological:  normal muscle tone. Coordination normal. No atrophy ?Skin: Skin warm and dry ?Psychiatric: normal affect, pleasant ? ? ?Recent Labs: ?10/21/2020: TSH 1.10 ?04/10/2021: ALT 14; BUN 21; Creatinine, Ser 1.11; Hemoglobin 17.7; Platelets 220.0; Potassium 4.1; Sodium 135  ? ? ?Lipid Panel ?Lab Results  ?Component Value Date  ? CHOL 191 10/21/2020  ? HDL 44.40 10/21/2020  ? LDLCALC 132 (H) 10/21/2020  ? TRIG 72.0 10/21/2020  ? ?  ? ?Wt Readings from Last 3 Encounters:  ?04/24/21 201 lb (91.2 kg)  ?04/16/21 205 lb (93 kg)  ?11/20/20 200 lb 6 oz (90.9  kg)  ?  ? ?ASSESSMENT AND PLAN: ? ?Atrial flutter with rapid ventricular response ([place])  ?Prior ablation ? 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  Atrial fibrillation dating back to 2017 (paroxysmal).  ?Not a great candidate for amiodarone given his younger age, ?Suspect options may be Tikosyn load and cardioversion versus ablation ?We will have him discuss options with EP ? ?Cardiomyopathy ?Drop in ejection fraction in the setting of atrial fibrillation, likely tachycardia mediated ?On SGLT2 inhibitor, metoprolol succinate for rate control ?We will focus on restoring normal sinus rhythm as detailed above ? ?  Total encounter time more than 40  minutes ? Greater than 50% was spent in counseling and coordination of care with the patient ? ? ?Orders Placed This Encounter  ?Procedures  ? Ambulatory referral to Cardiac Electrophysiology  ? EKG 12-Lead  ? ? ? ?Signed, ?Esmond Plants, M.D., Ph.D. ?04/24/2021  ?[place] ?[phone] ? ? ? ?

## 2021-04-27 NOTE — Progress Notes (Signed)
Resent blood thinner request

## 2021-04-27 NOTE — Telephone Encounter (Signed)
? ?  Patient Name: Jeffrey Ortiz  ?DOB: 04/26/1955 ?MRN: [id number] ? ?Primary Cardiologist: Dr. Ida Rogue ? ?Chart revisited as part of pre-operative protocol coverage. Patient saw Dr. Rockey Situ 04/24/21 for pre-procedure evaluation. The patient is not cleared at this time. Per his recommendations, the patient is "Not a good candidate for any procedures including colonoscopy given rapid atrial fibrillation and now with drop in ejection fraction on recent echo. We need to manage that first before any elective procedures.  Will likely take several months to get under control." The patient has follow-up with Dr. Rockey Situ 06/01/21; future recommendations regarding timing of colonoscopy can be reviewed at that time. ? ?Will route this bundled recommendation to requesting provider via Epic fax function. Please call with questions. ? ?Charlie Pitter, PA-C ?04/27/2021, 2:13 PM ? ? ?

## 2021-04-28 ENCOUNTER — Telehealth: Payer: Self-pay

## 2021-04-28 NOTE — Telephone Encounter (Signed)
Called no one answer left detailed message he was not cleared to have procedure called endo and canceled his procedure  ?

## 2021-05-05 ENCOUNTER — Telehealth: Payer: Self-pay | Admitting: Family

## 2021-05-05 NOTE — Telephone Encounter (Signed)
PA in process in cover my meds ?

## 2021-05-06 ENCOUNTER — Encounter: Payer: Self-pay | Admitting: Cardiology

## 2021-05-06 ENCOUNTER — Ambulatory Visit (INDEPENDENT_AMBULATORY_CARE_PROVIDER_SITE_OTHER): Payer: 59 | Admitting: Cardiology

## 2021-05-06 ENCOUNTER — Other Ambulatory Visit: Payer: Self-pay

## 2021-05-06 VITALS — BP 120/82 | HR 113 | Ht 72.0 in | Wt 200.0 lb

## 2021-05-06 DIAGNOSIS — I4819 Other persistent atrial fibrillation: Secondary | ICD-10-CM | POA: Diagnosis not present

## 2021-05-06 DIAGNOSIS — I5022 Chronic systolic (congestive) heart failure: Secondary | ICD-10-CM

## 2021-05-06 DIAGNOSIS — Z01812 Encounter for preprocedural laboratory examination: Secondary | ICD-10-CM

## 2021-05-06 DIAGNOSIS — I483 Typical atrial flutter: Secondary | ICD-10-CM

## 2021-05-06 DIAGNOSIS — I429 Cardiomyopathy, unspecified: Secondary | ICD-10-CM

## 2021-05-06 MED ORDER — AMIODARONE HCL 200 MG PO TABS: ORAL_TABLET | ORAL | 0 refills | Status: DC

## 2021-05-06 NOTE — Progress Notes (Signed)
?Electrophysiology Office Note:   ? ?Date:  05/06/2021  ? ?ID:  Jeffrey Ortiz, DOB 1955/12/28, MRN [id number] ? ?PCP:  [name], FNP  ?[facility] Cardiologist:  Ida Rogue, MD  ?[facility] Electrophysiologist:  Vickie Epley, MD  ? ?Referring MD: [name], FNP  ? ?Chief Complaint: Atrial flutter ? ?History of Present Illness:   ? ?Jeffrey Ortiz is a 66 y.o. male who presents for an evaluation of atrial flutter at the request of Dr. Rockey Situ. Their medical history includes atrial flutter post ablation in 7342, chronic systolic heart failure, hypertension, hyperlipidemia, sleep apnea and diabetes.  The patient was last seen by Dr. Rockey Situ April 24, 2021.  Before that visit he was previously seen in 2017.  He was diagnosed with atrial fibrillation in September 2022.  He was started back on Eliquis at that time.  In the setting of his rapid ventricular rates he reported shortness of breath and lower extremity edema.  His metoprolol was uptitrated and he was started on Lasix. ? ?Today he confirms the above.  He is very motivated to get back in normal rhythm. ? ?  ?Past Medical History:  ?Diagnosis Date  ? Atrial flutter ([place])   ? a. 8.2017 s/p RFCA.  ? Chronic systolic CHF (congestive heart failure) ([place])   ? a. 09/2015 Echo: EF 40-45%, diff HK.  ? Essential hypertension   ? Hyperlipidemia   ? Hypertension   ? Overweight   ? Sleep apnea   ? "did study 2 days ago; CPAP ordered" (09/26/2015)  ? Type II diabetes mellitus ([place])   ? ? ?Past Surgical History:  ?Procedure Laterality Date  ? COLONOSCOPY WITH PROPOFOL N/A 08/10/2018  ? Procedure: COLONOSCOPY WITH PROPOFOL;  Surgeon: Virgel Manifold, MD;  Location: [facility] ENDOSCOPY;  Service: Endoscopy;  Laterality: N/A;  ? ELECTROPHYSIOLOGIC STUDY N/A 09/26/2015  ? Procedure: A-Flutter;  Surgeon: Deboraha Sprang, MD;  Location: [place] [facility];  Service: Cardiovascular;  Laterality: N/A;  ? TEE WITHOUT CARDIOVERSION N/A 08/13/2015  ? Procedure:  TRANSESOPHAGEAL ECHOCARDIOGRAM (TEE) and cardioversion;  Surgeon: Minna Merritts, MD;  Location: [facility] ORS;  Service: Cardiovascular;  Laterality: N/A;  ? ? ?Current Medications: ?Current Meds  ?Medication Sig  ? blood glucose meter kit and supplies KIT Use to check blood sugar once daily Dispense based on patient and insurance preference.  ? dapagliflozin propanediol (FARXIGA) 5 MG TABS tablet Take 1 tablet (5 mg total) by mouth daily before breakfast.  ? ELIQUIS 5 MG TABS tablet Take 1 tablet by mouth twice daily  ? furosemide (LASIX) 40 MG tablet Take 1 tablet (40 mg total) by mouth daily.  ? metFORMIN (GLUCOPHAGE) 500 MG tablet Take 2 tablets by mouth twice daily  ? metoprolol succinate (TOPROL-XL) 100 MG 24 hr tablet Take 1 tablet (100 mg total) by mouth 2 (two) times daily.  ? metoprolol tartrate (LOPRESSOR) 25 MG tablet Take 1 tablet (25 mg total) by mouth as needed. Take for HR >100  ? potassium chloride SA (KLOR-CON M) 20 MEQ tablet Take 1 tablet (20 mEq total) by mouth daily.  ? rosuvastatin (CRESTOR) 10 MG tablet Take 1 tablet (10 mg total) by mouth every other day.  ? tamsulosin (FLOMAX) 0.4 MG CAPS capsule Take 1 capsule (0.4 mg total) by mouth daily.  ?  ? ?Allergies:   Patient has no known allergies.  ? ?Social History  ? ?Socioeconomic History  ? Marital status: Married  ?  Spouse name: Not on  file  ? Number of children: Not on file  ? Years of education: Not on file  ? Highest education level: Not on file  ?Occupational History  ? Not on file  ?Tobacco Use  ? Smoking status: Never  ?  Passive exposure: Never  ? Smokeless tobacco: Never  ?Vaping Use  ? Vaping Use: Never used  ?Substance and Sexual Activity  ? Alcohol use: No  ?  Alcohol/week: 0.0 standard drinks  ? Drug use: No  ?  Comment: Used marijuana a few times in college.  ? Sexual activity: Not Currently  ?Other Topics Concern  ? Not on file  ?Social History Narrative  ? Lives locally with wife.  Does not routinely exercise.    ? ?Social  Determinants of Health  ? ?Financial Resource Strain: Not on file  ?Food Insecurity: Not on file  ?Transportation Needs: Not on file  ?Physical Activity: Not on file  ?Stress: Not on file  ?Social Connections: Not on file  ?  ? ?Family History: ?The patient's family history includes Cancer in his father; Diabetes in his brother; Heart attack in his brother and father; Other (age of onset: 8) in his mother. There is no history of Colon cancer. ? ?ROS:   ?Please see the history of present illness.    ?All other systems reviewed and are negative. ? ?EKGs/Labs/Other Studies Reviewed:   ? ?The following studies were reviewed today: ? ?April 17, 2021 echo ?Severely reduced left ventricular function ?Mildly reduced right ventricular function ?Mildly dilated left atrium ?Mild MR ? ?April 24, 2021 EKG ?Atrial fibrillation with a rapid ventricular rate ? ?December 29, 2018 [facility] ?Normal left ventricular function ?No calcifications seen on the attenuation CT ?No ischemia or scar ? ? ?Recent Labs: ?10/21/2020: TSH 1.10 ?04/10/2021: ALT 14; BUN 21; Creatinine, Ser 1.11; Hemoglobin 17.7; Platelets 220.0; Potassium 4.1; Sodium 135  ?Recent Lipid Panel ?   ?Component Value Date/Time  ? CHOL 191 10/21/2020 0903  ? TRIG 72.0 10/21/2020 0903  ? HDL 44.40 10/21/2020 0903  ? CHOLHDL 4 10/21/2020 0903  ? VLDL 14.4 10/21/2020 0903  ? [place] 132 (H) 10/21/2020 3662  ? LDLCALC 120 (H) 02/01/2020 1418  ? ? ?Physical Exam:   ? ?VS:  BP 120/82 (BP Location: Left Arm, Patient Position: Sitting, Cuff Size: Normal)   Pulse (!) 113   Ht 6' (1.829 m)   Wt 200 lb (90.7 kg)   SpO2 98%   BMI 27.12 kg/m?    ? ?Wt Readings from Last 3 Encounters:  ?05/06/21 200 lb (90.7 kg)  ?04/24/21 201 lb (91.2 kg)  ?04/16/21 205 lb (93 kg)  ?  ? ?GEN:  Well nourished, well developed in no acute distress ?HEENT: Normal ?NECK: No JVD; No carotid bruits ?LYMPHATICS: No lymphadenopathy ?CARDIAC: Irregularly irregular, no murmurs, rubs,  gallops ?RESPIRATORY:  Clear to auscultation without rales, wheezing or rhonchi  ?ABDOMEN: Soft, non-tender, non-distended ?MUSCULOSKELETAL:  No edema; No deformity  ?SKIN: Warm and dry ?NEUROLOGIC:  Alert and oriented x 3 ?PSYCHIATRIC:  Normal affect  ? ? ?  ? ?ASSESSMENT:   ? ?1. Chronic systolic heart failure ([place])   ?2. Typical atrial flutter ([place])   ?3. Persistent atrial fibrillation ([place])   ? ?PLAN:   ? ?In order of problems listed above: ? ?#Chronic systolic heart failure ?NYHA class II symptoms.  EF 25 to 30%. ?I suspect this is tachycardia mediated.  Previous normal stress test at [facility] in 2020. ?Rhythm control be  very important ?Continue medical therapy. ? ?#Atrial fibrillation, persistent ?#Typical atrial flutter ?Rhythm control indicated as above.  Continue Eliquis for stroke prophylaxis.  I would like to start amiodarone as a temporary medication to hopefully restore normal rhythm in a more durable fashion and help improve his left ventricular function while we wait for his ablation date.  We will start 400 mg by mouth twice daily for 5 days followed by 400 mg by mouth once daily for 5 days followed by 200 mg by mouth thereafter.  We will plan to cardiovert in 4 weeks.  I discussed the cardioversion procedure in detail with the patient include the risks and he wishes to proceed. ? ?I also discussed catheter ablation in detail during today's visit including the risks, recovery and likelihood of success.  I discussed the possibility of needing future repeat ablations.  He understands and wishes to proceed with scheduling. ? ?Prior to the catheter ablation I would like to get a CT scan protocoled to assess both the coronary arteries and the pulmonary veins.  This should be protocoled as a CT coronary with FFR and we will have a delayed sequence to assess the pulmonary vein anatomy. ? ?Risk, benefits, and alternatives to EP study and radiofrequency ablation for afib were also discussed in detail today. These  risks include but are not limited to stroke, bleeding, vascular damage, tamponade, perforation, damage to the esophagus, lungs, and other structures, pulmonary vein stenosis, worsening renal function, and death. The patient unde

## 2021-05-06 NOTE — Addendum Note (Signed)
Addended byAlvis Lemmings C on: 05/06/2021 01:48 PM ? ? Modules accepted: Orders ? ?

## 2021-05-06 NOTE — Patient Instructions (Signed)
Medication Instructions:  ?- Your physician has recommended you make the following change in your medication:  ? ?1) START Amiodarone 200 mg: ?- take 2 tablets (400 mg) by mouth TWICE daily x 5 days,  ?- then, take 2 tablets (400 mg) by mouth ONCE daily x 5 days,  ?- then, take 1 tablet (200 mg) by mouth ONCE daily  ? ?*If you need a refill on your cardiac medications before your next appointment, please call your pharmacy* ? ? ?Lab Work: ?- Your physician recommends that you have lab work today:  BMP/ CBC ? ?If you have labs (blood work) drawn today and your tests are completely normal, you will receive your results only by: ?MyChart Message (if you have MyChart) OR ?A paper copy in the mail ?If you have any lab test that is abnormal or we need to change your treatment, we will call you to review the results. ? ? ?Testing/Procedures: ? ?1) Cardioversion: in 4 weeks ?- Your physician has recommended that you have a Cardioversion (DCCV). Electrical Cardioversion uses a jolt of electricity to your heart either through paddles or wired patches attached to your chest. This is a controlled, usually prescheduled, procedure. Defibrillation is done under light anesthesia in the hospital, and you usually go home the day of the procedure. This is done to get your heart back into a normal rhythm. You are not awake for the procedure.  ? ?You are scheduled for a Cardioversion on Tuesday 06/09/21 with Dr. Saunders Revel ? ?Please arrive at the [place] [facility] at 6:30 a.m. on the day of your procedure. ? ?Once you enter the [facility], proceed to the 1st desk on the right to check in. ? ?DIET INSTRUCTIONS:  ?Nothing to eat or drink after midnight the night prior to your procedure ? ?      ? ?Labs: today  ? ?Medications:  You may take all of your regular medications with enough water to get them down safely the morning of your procedure unless listed below: ? ?- HOLD lasix (furosemide) & glucophage (metformin) the morning of your  procedure ? ?Must have a responsible person to drive you home. ? ?Bring a current list of your medications and current insurance cards.  ? ? ?If you have any questions after you get home, please call the office at [phone] ? ? ? ? ?2) Cardiac CT: ?- Your physician has requested that you have cardiac CT. Cardiac computed tomography (CT) is a painless test that uses an x-ray machine to take clear, detailed pictures of your heart. . ?  ? ? ?Your cardiac CT will be scheduled at:  ? ?[facility] ?[address] ?[place], [place] [address] ?[phone] ? ?The day of your test please arrive at the [facility] Children's Entrance (Entrance C2) of [facility] 30 minutes prior to test start time. ?You can use the FREE valet parking offered at entrance C (encouraged to control the heart rate for the test)  ?Proceed to the [facility] Radiology Department (first floor) to check-in and test prep. ? ?All radiology patients and guests should use entrance C2 at [facility], accessed from [facility], even though the hospital's physical address listed is [address] ? ? ? ? ? ?Please follow these instructions carefully (unless otherwise directed): ? ?Hold all erectile dysfunction medications at least 3 days (72 hrs) prior to test. ? ?On the Night Before the Test: ?Be sure to Drink plenty  of water. ?Do not consume any caffeinated/decaffeinated beverages or chocolate 12 hours prior to your test. ?Do not take any antihistamines 12 hours prior to your test. ? ? ?On the Day of the Test: ?Drink plenty of water until 1 hour prior to the test. ?Do not eat any food 4 hours prior to the test. ?You may take your regular medications prior to the test.  ?Take metoprolol (Lopressor) 100 mg two hours prior to test. ?HOLD Furosemide morning of the test. ? ?     ?After the Test: ?Drink plenty of water. ?After receiving IV contrast, you may experience a mild flushed feeling. This is normal. ?On  occasion, you may experience a mild rash up to 24 hours after the test. This is not dangerous. If this occurs, you can take Benadryl 25 mg and increase your fluid intake. ?If you experience trouble breathing, this can be serious. If it is severe call 911 IMMEDIATELY. If it is mild, please call our office. ?If you take any of these medications: Glipizide/Metformin, Avandament, Glucavance, please do not take 48 hours after completing test unless otherwise instructed. ? ?We will call to schedule your test 2-4 weeks out understanding that some insurance companies will need an authorization prior to the service being performed.  ? ?For non-scheduling related questions, please contact the cardiac imaging nurse navigator should you have any questions/concerns: ?Marchia Bond, Cardiac Imaging Nurse Navigator ?Gordy Clement, Cardiac Imaging Nurse Navigator ?[facility] ?Direct Office Dial: [phone]  ? ?For scheduling needs, including cancellations and rescheduling, please call [place], [phone]. ? ? ?3) Atrial Fibrillation Ablation: ?- Your physician has recommended that you have an ablation. Catheter ablation is a medical procedure used to treat some cardiac arrhythmias (irregular heartbeats). During catheter ablation, a long, thin, flexible tube is put into a blood vessel in your groin (upper thigh), or neck. This tube is called an ablation catheter. It is then guided to your heart through the blood vessel. Radio frequency waves destroy small areas of heart tissue where abnormal heartbeats may cause an arrhythmia to start.  ? ? ? ?Follow-Up: ?At [facility], you and your health needs are our priority.  As part of our continuing mission to provide you with exceptional heart care, we have created designated Provider Care Teams.  These Care Teams include your primary Cardiologist (physician) and Advanced Practice Providers (APPs -  Physician Assistants and Nurse Practitioners) who all  work together to provide you with the care you need, when you need it. ? ?We recommend signing up for the patient portal called "MyChart".  Sign up information is provided on this After Visit Summary.  MyChart is used to connect with patients for Virtual Visits (Telemedicine).  Patients are able to view lab/test results, encounter notes, upcoming appointments, etc.  Non-urgent messages can be sent to your provider as well.   ?To learn more about what you can do with MyChart, go to [email or web address].   ? ?Your next appointment:   ?Pending your ablation date  ? ?The format for your next appointment:   ?In Person ? ?Provider:   ?Lars Mage, MD  ? ? ?Other Instructions ? ?Electrical Cardioversion ?Electrical cardioversion is the delivery of a jolt of electricity to restore a normal rhythm to the heart. A rhythm that is too fast or is not regular keeps the heart from pumping well. In this procedure, sticky patches or metal paddles are placed on the chest to deliver electricity to the heart from a device. ?  This procedure may be done in an emergency if: ?There is low or no blood pressure as a result of the heart rhythm. ?Normal rhythm must be restored as fast as possible to protect the brain and heart from further damage. ?It may save a life. ?This may also be a scheduled procedure for irregular or fast heart rhythms that are not immediately life-threatening. ?Tell a health care provider about: ?Any allergies you have. ?All medicines you are taking, including vitamins, herbs, eye drops, creams, and over-the-counter medicines. ?Any problems you or family members have had with anesthetic medicines. ?Any blood disorders you have. ?Any surgeries you have had. ?Any medical conditions you have. ?Whether you are pregnant or may be pregnant. ?What are the risks? ?Generally, this is a safe procedure. However, problems may occur, including: ?Allergic reactions to medicines. ?A blood clot that breaks free and travels to  other parts of your body. ?The possible return of an abnormal heart rhythm within hours or days after the procedure. ?Your heart stopping (cardiac arrest). This is rare. ?What happens before the procedure? ?Medi

## 2021-05-07 LAB — CBC
Hematocrit: 53 % — ABNORMAL HIGH (ref 37.5–51.0)
Hemoglobin: 18.3 g/dL — ABNORMAL HIGH (ref 13.0–17.7)
MCH: 31.3 pg (ref 26.6–33.0)
MCHC: 34.5 g/dL (ref 31.5–35.7)
MCV: 91 fL (ref 79–97)
Platelets: 243 10*3/uL (ref 150–450)
RBC: 5.84 x10E6/uL — ABNORMAL HIGH (ref 4.14–5.80)
RDW: 12.7 % (ref 11.6–15.4)
WBC: 7.4 10*3/uL (ref 3.4–10.8)

## 2021-05-07 LAB — BASIC METABOLIC PANEL
BUN/Creatinine Ratio: 20 (ref 10–24)
BUN: 20 mg/dL (ref 8–27)
CO2: 16 mmol/L — ABNORMAL LOW (ref 20–29)
Calcium: 9.3 mg/dL (ref 8.6–10.2)
Chloride: 103 mmol/L (ref 96–106)
Creatinine, Ser: 0.99 mg/dL (ref 0.76–1.27)
Glucose: 155 mg/dL — ABNORMAL HIGH (ref 70–99)
Potassium: 4.3 mmol/L (ref 3.5–5.2)
Sodium: 140 mmol/L (ref 134–144)
eGFR: 85 mL/min/{1.73_m2} (ref >59)

## 2021-05-08 ENCOUNTER — Other Ambulatory Visit: Payer: Self-pay

## 2021-05-08 ENCOUNTER — Telehealth: Payer: Self-pay

## 2021-05-08 DIAGNOSIS — E119 Type 2 diabetes mellitus without complications: Secondary | ICD-10-CM

## 2021-05-08 MED ORDER — DAPAGLIFLOZIN PROPANEDIOL 5 MG PO TABS: 5.0000 mg | ORAL_TABLET | Freq: Every day | ORAL | 1 refills | Status: DC

## 2021-05-08 NOTE — Telephone Encounter (Signed)
PA for Farxiga approved 05/05/21-05/06/22. ?

## 2021-05-08 NOTE — Addendum Note (Signed)
Addended byAlvis Lemmings C on: 05/08/2021 11:06 AM ? ? Modules accepted: Orders ? ?

## 2021-05-13 ENCOUNTER — Ambulatory Visit: Admit: 2021-05-13 | Payer: 59 | Admitting: Gastroenterology

## 2021-05-13 SURGERY — COLONOSCOPY WITH PROPOFOL
Anesthesia: General

## 2021-05-30 NOTE — Progress Notes (Signed)
Patient ID: Jeffrey Ortiz, male   DOB: 10/27/1955, 66 y.o.   MRN: [id number] ?Cardiology Office Note ? ?Date:  06/01/2021  ? ?ID:  Jeffrey Ortiz, DOB 08-23-1955, MRN [id number] ? ?PCP:  [name], FNP  ? ?Chief Complaint  ?Patient presents with  ? 1 month follow up   ?  Patient c/o a-fib spells at times. Medications reviewed by the patient verbally.   ? ? ?HPI:  ?Jeffrey Ortiz is a 66 yo-old gentleman with a history of  ?GERD,  ?diabetes, hypertension, hyperlipidemia,  ?hospital 08/11/2015 with worsening shortness of breath, orthopnea, PND ?after severe GERD, vomiting, aspiration into his sinuses ?noted to be in atrial flutter with RVR ?underwent TEE with cardioversion ?Cardioversion was initially successful though converted to atrial fibrillation ?Later back on the floor he went back into atrial flutter ?Atrial flutter-recurrent-typical  S/p ablation  ?Last seen in clinic 2017 ?Cardiomyopathy ejection fraction 25%-30% March 2023 while in atrial fibrillation ?He presents for follow-up of his atrial fibrillation, persistent ? ?LOV March 2023 ? ?Seen by EP March 2023 ?Started on amiodarone ?Plan to move forward with cardioversion, ?Followed by ablation ? ?In general has been feeling well with no complaints ?Has not been monitoring his heart rate, Fitbit band is broken ?Felt arrhythmia today, took extra metoprolol tartrate today 2 pm ?Heart rate 45 today ? ?Overall feels much better, calm, does not feel he has been having much arrhythmia ? ?Denies leg swelling, no significant shortness of breath exertion, sleeping well ? ?Discussed echocardiogram findings from April 16, 2021 ejection fraction 25%-30% ?Continues to take metoprolol succinate 100 twice daily, amiodarone 200 daily ? ?EKG personally reviewed by myself on todays visit ?Sinus bradycardia rate 45 bpm no significant ST or T wave changes ? ?Past medical history reviewed ?Covid 07/25/2020 ? ? ?PMH:   has a past medical history of Atrial flutter ([place]), Chronic  systolic CHF (congestive heart failure) ([place]), Essential hypertension, Hyperlipidemia, Hypertension, Overweight, Sleep apnea, and Type II diabetes mellitus ([place]). ? ?PSH:    ?Past Surgical History:  ?Procedure Laterality Date  ? COLONOSCOPY WITH PROPOFOL N/A 08/10/2018  ? Procedure: COLONOSCOPY WITH PROPOFOL;  Surgeon: Virgel Manifold, MD;  Location: [facility] ENDOSCOPY;  Service: Endoscopy;  Laterality: N/A;  ? ELECTROPHYSIOLOGIC STUDY N/A 09/26/2015  ? Procedure: A-Flutter;  Surgeon: Deboraha Sprang, MD;  Location: [facility];  Service: Cardiovascular;  Laterality: N/A;  ? TEE WITHOUT CARDIOVERSION N/A 08/13/2015  ? Procedure: TRANSESOPHAGEAL ECHOCARDIOGRAM (TEE) and cardioversion;  Surgeon: Minna Merritts, MD;  Location: [facility] ORS;  Service: Cardiovascular;  Laterality: N/A;  ? ? ?Current Outpatient Medications  ?Medication Sig Dispense Refill  ? amiodarone (PACERONE) 200 MG tablet Take 200 mg by mouth daily.    ? blood glucose meter kit and supplies KIT Use to check blood sugar once daily Dispense based on patient and insurance preference. 1 each 0  ? dapagliflozin propanediol (FARXIGA) 5 MG TABS tablet Take 1 tablet (5 mg total) by mouth daily before breakfast. 90 tablet 1  ? ELIQUIS 5 MG TABS tablet Take 1 tablet by mouth twice daily 60 tablet 1  ? furosemide (LASIX) 40 MG tablet Take 1 tablet (40 mg total) by mouth daily. 90 tablet 3  ? metFORMIN (GLUCOPHAGE) 500 MG tablet Take 2 tablets by mouth twice daily 360 tablet 1  ? metoprolol succinate (TOPROL-XL) 100 MG 24 hr tablet Take 1 tablet (100 mg total) by mouth 2 (two) times daily. 90 tablet 3  ? metoprolol tartrate (LOPRESSOR) 25  MG tablet Take 1 tablet (25 mg total) by mouth as needed. Take for HR >100 60 tablet 3  ? potassium chloride SA (KLOR-CON M) 20 MEQ tablet Take 1 tablet (20 mEq total) by mouth daily. 90 tablet 3  ? rosuvastatin (CRESTOR) 10 MG tablet Take 1 tablet (10 mg total) by mouth every other day. 90 tablet 0  ? tamsulosin (FLOMAX) 0.4  MG CAPS capsule Take 1 capsule (0.4 mg total) by mouth daily. 30 capsule 11  ? ?No current facility-administered medications for this visit.  ? ? ? ?Allergies:   Patient has no known allergies.  ? ?Social History:  The patient  reports that he has never smoked. He has never been exposed to tobacco smoke. He has never used smokeless tobacco. He reports that he does not drink alcohol and does not use drugs.  ? ?Family History:   family history includes Cancer in his father; Diabetes in his brother; Heart attack in his brother and father; Other (age of onset: 57) in his mother.  ? ? ?Review of Systems: ?Review of Systems  ?Constitutional: Negative.   ?HENT: Negative.    ?Respiratory: Negative.    ?Cardiovascular: Negative.   ?Gastrointestinal: Negative.   ?Musculoskeletal: Negative.   ?Neurological: Negative.   ?Psychiatric/Behavioral: Negative.    ?All other systems reviewed and are negative. ? ? ?PHYSICAL EXAM: ?VS:  BP (!) 160/80 (BP Location: Left Arm, Patient Position: Sitting, Cuff Size: Normal)   Pulse (!) 45   Ht 6' (1.829 m)   Wt 92.5 kg   SpO2 (!) 45%   BMI 27.67 kg/m?  , BMI Body mass index is 27.67 kg/m?Marland Kitchen ?Constitutional:  oriented to person, place, and time. No distress.  ?HENT:  ?Head: Grossly normal ?Eyes:  no discharge. No scleral icterus.  ?Neck: No JVD, no carotid bruits  ?Cardiovascular: Regular, bradycardic no murmurs appreciated ?Pulmonary/Chest: Clear to auscultation bilaterally, no wheezes or rails ?Abdominal: Soft.  no distension.  no tenderness.  ?Musculoskeletal: Normal range of motion ?Neurological:  normal muscle tone. Coordination normal. No atrophy ?Skin: Skin warm and dry ?Psychiatric: normal affect, pleasant ? ? ?Recent Labs: ?10/21/2020: TSH 1.10 ?04/10/2021: ALT 14 ?05/06/2021: BUN 20; Creatinine, Ser 0.99; Hemoglobin 18.3; Platelets 243; Potassium 4.3; Sodium 140  ? ? ?Lipid Panel ?Lab Results  ?Component Value Date  ? CHOL 191 10/21/2020  ? HDL 44.40 10/21/2020  ? LDLCALC 132 (H)  10/21/2020  ? TRIG 72.0 10/21/2020  ? ?  ? ?Wt Readings from Last 3 Encounters:  ?06/01/21 92.5 kg  ?05/06/21 90.7 kg  ?04/24/21 91.2 kg  ?  ? ?ASSESSMENT AND PLAN: ? ?Atrial flutter with rapid ventricular response ([place])  ?Prior ablation ? ?Persistent atrial fibrillation ?-Appears to have converted to normal sinus rhythm on high-dose metoprolol succinate 100 twice daily and amiodarone ?-Recommend he continue current medication ?Can cancel his scheduled cardioversion ?-Recommend he consider ablation, appears this has been scheduled in the next several months ? ?Cardiomyopathy ?Repeat limited echo ordered to reassess ejection fraction in the setting of normal sinus rhythm  ? ? Total encounter time more than 30  minutes ? Greater than 50% was spent in counseling and coordination of care with the patient ? ? ?Orders Placed This Encounter  ?Procedures  ? EKG 12-Lead  ? ? ? ?Signed, ?Esmond Plants, M.D., Ph.D. ?06/01/2021  ?[place] ?[phone] ? ? ? ?

## 2021-06-01 ENCOUNTER — Encounter: Payer: Self-pay | Admitting: Cardiovascular Disease

## 2021-06-01 ENCOUNTER — Ambulatory Visit: Payer: 59 | Admitting: Cardiovascular Disease

## 2021-06-01 VITALS — BP 160/80 | HR 45 | Ht 72.0 in | Wt 204.0 lb

## 2021-06-01 DIAGNOSIS — I4819 Other persistent atrial fibrillation: Secondary | ICD-10-CM | POA: Diagnosis not present

## 2021-06-01 DIAGNOSIS — I4891 Unspecified atrial fibrillation: Secondary | ICD-10-CM

## 2021-06-01 DIAGNOSIS — Z8669 Personal history of other diseases of the nervous system and sense organs: Secondary | ICD-10-CM | POA: Diagnosis not present

## 2021-06-01 DIAGNOSIS — I483 Typical atrial flutter: Secondary | ICD-10-CM | POA: Diagnosis not present

## 2021-06-01 DIAGNOSIS — I5022 Chronic systolic (congestive) heart failure: Secondary | ICD-10-CM

## 2021-06-01 DIAGNOSIS — I10 Essential (primary) hypertension: Secondary | ICD-10-CM | POA: Diagnosis not present

## 2021-06-01 DIAGNOSIS — I429 Cardiomyopathy, unspecified: Secondary | ICD-10-CM | POA: Diagnosis not present

## 2021-06-01 NOTE — Patient Instructions (Addendum)
Limited echo for cardiomyopathy ? ?Monitor blood pressure and heart rate ? ?Medication Instructions:  ?No changes ?For breakthrough atrial fib spell, ?Take metoprolol  tartrate x1 ?Maybe extra amiodarone  ? ?If you need a refill on your cardiac medications before your next appointment, please call your pharmacy.  ? ?Lab work: ?No new labs needed ? ?Testing/Procedures: ?Your physician has requested that you have an limited echocardiogram. Echocardiography is a painless test that uses sound waves to create images of your heart. It provides your doctor with information about the size and shape of your heart and how well your heart?s chambers and valves are working. This procedure takes approximately one hour. There are no restrictions for this procedure.  ? ?Follow-Up: ?At [facility], you and your health needs are our priority.  As part of our continuing mission to provide you with exceptional heart care, we have created designated Provider Care Teams.  These Care Teams include your primary Cardiologist (physician) and Advanced Practice Providers (APPs -  Physician Assistants and Nurse Practitioners) who all work together to provide you with the care you need, when you need it. ? ?You will need a follow up appointment in 6 months ? ?Providers on your designated Care Team:   ?Murray Hodgkins, NP ?Christell Faith, PA-C ?Cadence Kathlen Mody, PA-C ? ?COVID-19 Vaccine Information can be found at: [email or web address].co.uk For questions related to vaccine distribution or appointments, please email [email or web address] or call [phone].  ? ?

## 2021-06-03 ENCOUNTER — Telehealth: Payer: Self-pay | Admitting: Cardiovascular Disease

## 2021-06-03 ENCOUNTER — Other Ambulatory Visit: Payer: Self-pay | Admitting: Family

## 2021-06-03 DIAGNOSIS — I480 Paroxysmal atrial fibrillation: Secondary | ICD-10-CM

## 2021-06-03 DIAGNOSIS — E119 Type 2 diabetes mellitus without complications: Secondary | ICD-10-CM

## 2021-06-03 DIAGNOSIS — I4892 Unspecified atrial flutter: Secondary | ICD-10-CM

## 2021-06-03 NOTE — Telephone Encounter (Signed)
?*  STAT* If patient is at the pharmacy, call can be transferred to refill team. ? ? ?1. Which medications need to be refilled? (please list name of each medication and dose if known)  ? amiodarone (PACERONE) 200 MG tablet  ? ? dapagliflozin propanediol (FARXIGA) 5 MG TABS tablet  ? ? ELIQUIS 5 MG TABS tablet  ? ? furosemide (LASIX) 40 MG tablet  ? ? metoprolol tartrate (LOPRESSOR) 25 MG tablet  ? ? ?2. Which pharmacy/location (including street and city if local pharmacy) is medication to be sent to?  ?[place], [place] Phone:  [phone]  ?   ?  ? ? ?3. Do they need a 30 day or 90 day supply? 30 ? ?

## 2021-06-04 ENCOUNTER — Telehealth: Payer: Self-pay | Admitting: Cardiovascular Disease

## 2021-06-04 MED ORDER — DAPAGLIFLOZIN PROPANEDIOL 5 MG PO TABS: 5.0000 mg | ORAL_TABLET | Freq: Every day | ORAL | 3 refills | Status: DC

## 2021-06-04 MED ORDER — METOPROLOL TARTRATE 25 MG PO TABS: 25.0000 mg | ORAL_TABLET | ORAL | 3 refills | Status: DC | PRN

## 2021-06-04 MED ORDER — FUROSEMIDE 40 MG PO TABS: 40.0000 mg | ORAL_TABLET | Freq: Every day | ORAL | 3 refills | Status: DC

## 2021-06-04 MED ORDER — AMIODARONE HCL 200 MG PO TABS: 200.0000 mg | ORAL_TABLET | Freq: Every day | ORAL | 3 refills | Status: DC

## 2021-06-04 NOTE — Telephone Encounter (Signed)
Called and spoke with patient.  ? ?He reports that he he hasn't gotten his fitbit yet, but is planning on getting one this afternoon.  ? ?He does feel like he is still in normal rhythm and doesn't feel like he's in a fib. States that he thinks the amiodarone is working really well for him.  ? ?Is supposed to have cardioversion on 4/25.  ? ?Will forward to MD for review.  ?

## 2021-06-04 NOTE — Telephone Encounter (Signed)
Requested Prescriptions  ? ?Signed Prescriptions Disp Refills  ? amiodarone (PACERONE) 200 MG tablet 30 tablet 3  ?  Sig: Take 1 tablet (200 mg total) by mouth daily.  ?  Authorizing Provider: Minna Merritts  ?  Ordering User: Othelia Pulling C  ? dapagliflozin propanediol (FARXIGA) 5 MG TABS tablet 30 tablet 3  ?  Sig: Take 1 tablet (5 mg total) by mouth daily before breakfast.  ?  Authorizing Provider: Minna Merritts  ?  Ordering User: Othelia Pulling C  ? furosemide (LASIX) 40 MG tablet 30 tablet 3  ?  Sig: Take 1 tablet (40 mg total) by mouth daily.  ?  Authorizing Provider: Minna Merritts  ?  Ordering User: Othelia Pulling C  ? metoprolol tartrate (LOPRESSOR) 25 MG tablet 60 tablet 3  ?  Sig: Take 1 tablet (25 mg total) by mouth as needed. Take for HR >100  ?  Authorizing Provider: Minna Merritts  ?  Ordering User: Othelia Pulling C  ? ? ?

## 2021-06-04 NOTE — Telephone Encounter (Signed)
? ?  Pt is calling, he said he have questions about his upcoming cardioversion ?

## 2021-06-04 NOTE — Telephone Encounter (Signed)
Refill request

## 2021-06-05 NOTE — Telephone Encounter (Signed)
"  Sorry for the confusion we can cancel the cardioversion  ?He was normal sinus rhythm with me in clinic on amiodarone  ?Low risk he will go back to A-fib  ?He needs to decide if he wants ablation with a [facility] down the road  ?Thx  ?TGollan" ? ?Called to let patient know that cardioversion was not needed. Pt verbalized understanding and voiced appreciation for the call. ? ?Called and left a message with scheduling. Will follow up on Monday to ensure it was canceled.  ?

## 2021-06-09 ENCOUNTER — Ambulatory Visit: Admission: RE | Admit: 2021-06-09 | Payer: 59 | Source: Home / Self Care | Admitting: Internal Medicine

## 2021-06-09 ENCOUNTER — Encounter: Admission: RE | Payer: Self-pay | Source: Home / Self Care

## 2021-06-09 DIAGNOSIS — I480 Paroxysmal atrial fibrillation: Secondary | ICD-10-CM

## 2021-06-09 SURGERY — CARDIOVERSION
Anesthesia: General

## 2021-06-11 ENCOUNTER — Encounter: Payer: Self-pay | Admitting: *Deleted

## 2021-06-19 ENCOUNTER — Ambulatory Visit (INDEPENDENT_AMBULATORY_CARE_PROVIDER_SITE_OTHER): Payer: 59

## 2021-06-19 DIAGNOSIS — I429 Cardiomyopathy, unspecified: Secondary | ICD-10-CM | POA: Diagnosis not present

## 2021-06-19 LAB — ECHOCARDIOGRAM LIMITED
Area-P 1/2: 5.38 cm2
Calc EF: 45 %
S' Lateral: 3.5 cm
Single Plane A2C EF: 44 %
Single Plane A4C EF: 45.6 %

## 2021-06-22 ENCOUNTER — Telehealth: Payer: Self-pay | Admitting: Emergency Medicine

## 2021-06-22 NOTE — Telephone Encounter (Signed)
Called patient, went over results and recommendations. Pt verbalized understanding, and questions, if any, were answered.  ? ?Pt will monitor BP 1-2 times per day over the next week and either call with numbers or send them through [place].  ?

## 2021-06-22 NOTE — Telephone Encounter (Signed)
-----   Message from Minna Merritts, MD sent at 06/20/2021  2:04 PM EDT ----- ?Echocardiogram ?Ejection fraction has improved up to 45 to 50%, previously was 25 to 30% ?Pump squeeze better in normal sinus rhythm ?Would monitor blood pressure and call us with some numbers ?High blood pressure can contribute to lower ejection fraction ?If room on the blood pressure, there are medications to help try to recover ejection fraction ?

## 2021-07-08 ENCOUNTER — Telehealth: Payer: Self-pay | Admitting: Cardiology

## 2021-07-08 NOTE — Telephone Encounter (Signed)
Follow Up:    Patient is returning Tina's call, concerning rescheduling his Ablation.

## 2021-07-08 NOTE — Telephone Encounter (Signed)
New Message:   Patient need to reschedule his Ablation for 08-06-21.He will not be in town.

## 2021-07-08 NOTE — Telephone Encounter (Signed)
Left message to call back  

## 2021-07-09 NOTE — Telephone Encounter (Signed)
Pt calling back to reschedule ablation. Req call back.

## 2021-07-10 ENCOUNTER — Other Ambulatory Visit: Payer: Self-pay | Admitting: *Deleted

## 2021-07-10 ENCOUNTER — Encounter: Payer: Self-pay | Admitting: *Deleted

## 2021-07-10 DIAGNOSIS — I4819 Other persistent atrial fibrillation: Secondary | ICD-10-CM

## 2021-07-10 DIAGNOSIS — Z01818 Encounter for other preprocedural examination: Secondary | ICD-10-CM

## 2021-07-10 NOTE — Telephone Encounter (Addendum)
Picked new procedure day and preop lab day. Will send updated instructions over mychart. Patient verbalized understanding and agreement.

## 2021-07-21 ENCOUNTER — Encounter: Payer: Self-pay | Admitting: *Deleted

## 2021-07-30 ENCOUNTER — Ambulatory Visit (HOSPITAL_COMMUNITY): Payer: 59

## 2021-08-03 ENCOUNTER — Telehealth: Payer: Self-pay | Admitting: Cardiology

## 2021-08-03 NOTE — Telephone Encounter (Signed)
Went over instructions. Patient verbalized understanding and agreement.

## 2021-08-03 NOTE — Telephone Encounter (Signed)
Follow Up:     Patient is returning Tina's call from today.

## 2021-08-03 NOTE — Telephone Encounter (Signed)
See 5/24 encounter

## 2021-08-03 NOTE — Telephone Encounter (Signed)
Work up completed.   Left message to call back to go over CT scan and procedure instructions.

## 2021-09-02 ENCOUNTER — Other Ambulatory Visit: Payer: Self-pay | Admitting: Family

## 2021-09-02 DIAGNOSIS — I480 Paroxysmal atrial fibrillation: Secondary | ICD-10-CM

## 2021-09-02 NOTE — Telephone Encounter (Signed)
REFILL SENT TO PHARMACY  

## 2021-09-03 ENCOUNTER — Telehealth: Payer: Self-pay

## 2021-09-03 ENCOUNTER — Ambulatory Visit (HOSPITAL_COMMUNITY): Payer: 59 | Admitting: Nurse Practitioner

## 2021-09-03 NOTE — Telephone Encounter (Signed)
Due to cath lab needs, rescheduled patient to second case at 1030 on 10/05/2021.  The patient will now need  to arrive at 0830 for 1030 procedure instead of 0530.  Left message to call back to confirm.

## 2021-09-03 NOTE — Telephone Encounter (Signed)
Confirmed with patient that his arrival time for ablation will now be 0830 for 1030 case. He was grateful for call and agrees with plan.

## 2021-09-24 ENCOUNTER — Telehealth (HOSPITAL_COMMUNITY): Payer: Self-pay | Admitting: Emergency Medicine

## 2021-09-24 ENCOUNTER — Encounter (HOSPITAL_COMMUNITY): Payer: Self-pay

## 2021-09-24 NOTE — Telephone Encounter (Signed)
Calling to remind patient of labs needed for CTA on Monday.   Reaching out to patient to offer assistance regarding upcoming cardiac imaging study; pt verbalizes understanding of appt date/time, parking situation and where to check in, pre-test NPO status and medications ordered, and verified current allergies; name and call back number provided for further questions should they arise Marchia Bond RN Navigator Cardiac Imaging [place] [facility] [phone] office [phone] cell  Sent pictures of driving directions due to contruction on [place] to mychart.

## 2021-09-28 ENCOUNTER — Ambulatory Visit (HOSPITAL_COMMUNITY)
Admission: RE | Admit: 2021-09-28 | Discharge: 2021-09-28 | Disposition: A | Discharge status: Home / Self Care | Payer: 59 | Source: Ambulatory Visit | Attending: Cardiology | Admitting: Cardiology

## 2021-09-28 DIAGNOSIS — I429 Cardiomyopathy, unspecified: Secondary | ICD-10-CM | POA: Insufficient documentation

## 2021-09-28 LAB — POCT I-STAT CREATININE: Creatinine, Ser: 1.1 mg/dL (ref 0.61–1.24)

## 2021-09-28 MED ORDER — NITROGLYCERIN 0.4 MG SL SUBL
SUBLINGUAL_TABLET | SUBLINGUAL | Status: AC
  Filled 2021-09-28: qty 2

## 2021-09-28 MED ORDER — NITROGLYCERIN 0.4 MG SL SUBL
0.8000 mg | SUBLINGUAL_TABLET | Freq: Once | SUBLINGUAL | Status: AC
Start: 2021-09-28 — End: 2021-09-28
  Administered 2021-09-28: 0.8 mg via SUBLINGUAL

## 2021-09-28 MED ORDER — IOHEXOL 350 MG/ML SOLN
95.0000 mL | Freq: Once | INTRAVENOUS | Status: AC | PRN
  Administered 2021-09-28: 95 mL via INTRAVENOUS

## 2021-10-05 ENCOUNTER — Ambulatory Visit (HOSPITAL_COMMUNITY)
Admission: RE | Admit: 2021-10-05 | Discharge: 2021-10-05 | Disposition: A | Discharge status: Home / Self Care | Payer: 59 | Attending: Cardiology | Admitting: Cardiology

## 2021-10-05 ENCOUNTER — Ambulatory Visit (HOSPITAL_COMMUNITY): Payer: 59 | Admitting: Certified Registered Nurse Anesthetist

## 2021-10-05 ENCOUNTER — Ambulatory Visit (HOSPITAL_BASED_OUTPATIENT_CLINIC_OR_DEPARTMENT_OTHER): Payer: 59 | Admitting: Certified Registered Nurse Anesthetist

## 2021-10-05 ENCOUNTER — Encounter (HOSPITAL_COMMUNITY)
Admission: RE | Disposition: A | Discharge status: Home / Self Care | Payer: Self-pay | Source: Home / Self Care | Attending: Cardiology

## 2021-10-05 ENCOUNTER — Other Ambulatory Visit: Payer: Self-pay

## 2021-10-05 DIAGNOSIS — I509 Heart failure, unspecified: Secondary | ICD-10-CM | POA: Diagnosis not present

## 2021-10-05 DIAGNOSIS — G4730 Sleep apnea, unspecified: Secondary | ICD-10-CM | POA: Insufficient documentation

## 2021-10-05 DIAGNOSIS — E119 Type 2 diabetes mellitus without complications: Secondary | ICD-10-CM | POA: Diagnosis not present

## 2021-10-05 DIAGNOSIS — I5022 Chronic systolic (congestive) heart failure: Secondary | ICD-10-CM | POA: Diagnosis not present

## 2021-10-05 DIAGNOSIS — Z7984 Long term (current) use of oral hypoglycemic drugs: Secondary | ICD-10-CM | POA: Diagnosis not present

## 2021-10-05 DIAGNOSIS — I110 Hypertensive heart disease with heart failure: Secondary | ICD-10-CM | POA: Insufficient documentation

## 2021-10-05 DIAGNOSIS — I4891 Unspecified atrial fibrillation: Secondary | ICD-10-CM | POA: Diagnosis not present

## 2021-10-05 DIAGNOSIS — E785 Hyperlipidemia, unspecified: Secondary | ICD-10-CM | POA: Insufficient documentation

## 2021-10-05 DIAGNOSIS — I4819 Other persistent atrial fibrillation: Secondary | ICD-10-CM | POA: Insufficient documentation

## 2021-10-05 DIAGNOSIS — I483 Typical atrial flutter: Secondary | ICD-10-CM | POA: Diagnosis not present

## 2021-10-05 HISTORY — PX: ATRIAL FIBRILLATION ABLATION: EP1191

## 2021-10-05 LAB — GLUCOSE, CAPILLARY
Glucose-Capillary: 144 mg/dL — ABNORMAL HIGH (ref 70–99)
Glucose-Capillary: 150 mg/dL — ABNORMAL HIGH (ref 70–99)

## 2021-10-05 LAB — BASIC METABOLIC PANEL
Anion gap: 7 (ref 5–15)
BUN: 15 mg/dL (ref 8–23)
CO2: 23 mmol/L (ref 22–32)
Calcium: 8.5 mg/dL — ABNORMAL LOW (ref 8.9–10.3)
Chloride: 109 mmol/L (ref 98–111)
Creatinine, Ser: 1.03 mg/dL (ref 0.61–1.24)
GFR, Estimated: >60 mL/min (ref >60)
Glucose, Bld: 125 mg/dL — ABNORMAL HIGH (ref 70–99)
Potassium: 3.8 mmol/L (ref 3.5–5.1)
Sodium: 139 mmol/L (ref 135–145)

## 2021-10-05 LAB — CBC
HCT: 49.6 % (ref 39.0–52.0)
Hemoglobin: 17.1 g/dL — ABNORMAL HIGH (ref 13.0–17.0)
MCH: 32.7 pg (ref 26.0–34.0)
MCHC: 34.5 g/dL (ref 30.0–36.0)
MCV: 94.8 fL (ref 80.0–100.0)
Platelets: 219 10*3/uL (ref 150–400)
RBC: 5.23 MIL/uL (ref 4.22–5.81)
RDW: 11.8 % (ref 11.5–15.5)
WBC: 7.3 10*3/uL (ref 4.0–10.5)
nRBC: 0 % (ref 0.0–0.2)

## 2021-10-05 LAB — POCT ACTIVATED CLOTTING TIME
Activated Clotting Time: 323 seconds
Activated Clotting Time: 293 seconds

## 2021-10-05 SURGERY — ATRIAL FIBRILLATION ABLATION
Anesthesia: General

## 2021-10-05 MED ORDER — ROCURONIUM BROMIDE 10 MG/ML (PF) SYRINGE
PREFILLED_SYRINGE | INTRAVENOUS | Status: DC | PRN
  Administered 2021-10-05: 20 mg via INTRAVENOUS
  Administered 2021-10-05: 50 mg via INTRAVENOUS

## 2021-10-05 MED ORDER — PANTOPRAZOLE SODIUM 40 MG PO TBEC
40.0000 mg | DELAYED_RELEASE_TABLET | Freq: Every day | ORAL | Status: DC
  Administered 2021-10-05: 40 mg via ORAL
  Filled 2021-10-05: qty 1

## 2021-10-05 MED ORDER — PANTOPRAZOLE SODIUM 40 MG PO TBEC: 40.0000 mg | DELAYED_RELEASE_TABLET | Freq: Every day | ORAL | 0 refills | Status: DC

## 2021-10-05 MED ORDER — LIDOCAINE 2% (20 MG/ML) 5 ML SYRINGE
INTRAMUSCULAR | Status: DC | PRN
  Administered 2021-10-05: 60 mg via INTRAVENOUS

## 2021-10-05 MED ORDER — EPHEDRINE SULFATE-NACL 50-0.9 MG/10ML-% IV SOSY
PREFILLED_SYRINGE | INTRAVENOUS | Status: DC | PRN
  Administered 2021-10-05 (×3): 5 mg via INTRAVENOUS

## 2021-10-05 MED ORDER — PHENYLEPHRINE 80 MCG/ML (10ML) SYRINGE FOR IV PUSH (FOR BLOOD PRESSURE SUPPORT)
PREFILLED_SYRINGE | INTRAVENOUS | Status: DC | PRN
  Administered 2021-10-05: 160 ug via INTRAVENOUS
  Administered 2021-10-05: 80 ug via INTRAVENOUS

## 2021-10-05 MED ORDER — HEPARIN (PORCINE) IN NACL 1000-0.9 UT/500ML-% IV SOLN
INTRAVENOUS | Status: AC
  Filled 2021-10-05: qty 1500

## 2021-10-05 MED ORDER — ONDANSETRON HCL 4 MG/2ML IJ SOLN: 4.0000 mg | Freq: Four times a day (QID) | INTRAMUSCULAR | Status: DC | PRN

## 2021-10-05 MED ORDER — FENTANYL CITRATE (PF) 250 MCG/5ML IJ SOLN
INTRAMUSCULAR | Status: DC | PRN
  Administered 2021-10-05 (×2): 50 ug via INTRAVENOUS

## 2021-10-05 MED ORDER — PROTAMINE SULFATE 10 MG/ML IV SOLN
INTRAVENOUS | Status: DC | PRN
  Administered 2021-10-05: 30 mg via INTRAVENOUS

## 2021-10-05 MED ORDER — COLCHICINE 0.6 MG PO TABS
0.6000 mg | ORAL_TABLET | Freq: Two times a day (BID) | ORAL | Status: DC
Start: 2021-10-05 — End: 2021-10-05
  Administered 2021-10-05: 0.6 mg via ORAL
  Filled 2021-10-05: qty 1

## 2021-10-05 MED ORDER — SUGAMMADEX SODIUM 200 MG/2ML IV SOLN
INTRAVENOUS | Status: DC | PRN
  Administered 2021-10-05: 200 mg via INTRAVENOUS

## 2021-10-05 MED ORDER — SODIUM CHLORIDE 0.9 % IV SOLN: 250.0000 mL | INTRAVENOUS | Status: DC | PRN

## 2021-10-05 MED ORDER — SODIUM CHLORIDE 0.9% FLUSH: 3.0000 mL | INTRAVENOUS | Status: DC | PRN

## 2021-10-05 MED ORDER — HEPARIN SODIUM (PORCINE) 1000 UNIT/ML IJ SOLN
INTRAMUSCULAR | Status: DC | PRN
  Administered 2021-10-05: 1000 [IU] via INTRAVENOUS

## 2021-10-05 MED ORDER — APIXABAN 5 MG PO TABS
5.0000 mg | ORAL_TABLET | Freq: Two times a day (BID) | ORAL | Status: DC
  Administered 2021-10-05: 5 mg via ORAL
  Filled 2021-10-05: qty 1

## 2021-10-05 MED ORDER — ACETAMINOPHEN 325 MG PO TABS: 650.0000 mg | ORAL_TABLET | ORAL | Status: DC | PRN

## 2021-10-05 MED ORDER — FENTANYL CITRATE (PF) 100 MCG/2ML IJ SOLN
INTRAMUSCULAR | Status: AC
  Filled 2021-10-05: qty 2

## 2021-10-05 MED ORDER — SODIUM CHLORIDE 0.9% FLUSH: 3.0000 mL | Freq: Two times a day (BID) | INTRAVENOUS | Status: DC

## 2021-10-05 MED ORDER — PHENYLEPHRINE HCL-NACL 20-0.9 MG/250ML-% IV SOLN
INTRAVENOUS | Status: DC | PRN
  Administered 2021-10-05: 20 ug/min via INTRAVENOUS

## 2021-10-05 MED ORDER — ONDANSETRON HCL 4 MG/2ML IJ SOLN
INTRAMUSCULAR | Status: DC | PRN
  Administered 2021-10-05: 4 mg via INTRAVENOUS

## 2021-10-05 MED ORDER — HEPARIN SODIUM (PORCINE) 1000 UNIT/ML IJ SOLN
INTRAMUSCULAR | Status: AC
  Filled 2021-10-05: qty 10

## 2021-10-05 MED ORDER — PROPOFOL 10 MG/ML IV BOLUS
INTRAVENOUS | Status: DC | PRN
  Administered 2021-10-05: 140 mg via INTRAVENOUS
  Administered 2021-10-05: 60 mg via INTRAVENOUS

## 2021-10-05 MED ORDER — HEPARIN (PORCINE) IN NACL 1000-0.9 UT/500ML-% IV SOLN
INTRAVENOUS | Status: DC | PRN
  Administered 2021-10-05 (×3): 500 mL

## 2021-10-05 MED ORDER — SODIUM CHLORIDE 0.9 % IV SOLN: INTRAVENOUS | Status: DC

## 2021-10-05 MED ORDER — DEXAMETHASONE SODIUM PHOSPHATE 10 MG/ML IJ SOLN
INTRAMUSCULAR | Status: DC | PRN
  Administered 2021-10-05: 5 mg via INTRAVENOUS

## 2021-10-05 MED ORDER — COLCHICINE 0.6 MG PO TABS: 0.6000 mg | ORAL_TABLET | Freq: Two times a day (BID) | ORAL | 0 refills | Status: DC

## 2021-10-05 MED ORDER — HEPARIN SODIUM (PORCINE) 1000 UNIT/ML IJ SOLN
INTRAMUSCULAR | Status: DC | PRN
  Administered 2021-10-05: 3000 [IU] via INTRAVENOUS
  Administered 2021-10-05: 15000 [IU] via INTRAVENOUS

## 2021-10-05 SURGICAL SUPPLY — 18 items
CATH 8FR ACUNAV REPROCESSED (CATHETERS) IMPLANT
CATH ABLAT QDOT MICRO BI TC DF (CATHETERS) IMPLANT
CATH OCTARAY 2.0 F 3-3-3-3-3 (CATHETERS) IMPLANT
CATH REPROCESSED 8FR ACUNAV (CATHETERS) ×1 IMPLANT
CATH S-M CIRCA TEMP PROBE (CATHETERS) IMPLANT
CATH WEBSTER BI DIR CS D-F CRV (CATHETERS) IMPLANT
CLOSURE PERCLOSE PROSTYLE (VASCULAR PRODUCTS) IMPLANT
COVER SWIFTLINK CONNECTOR (BAG) ×1 IMPLANT
PACK EP LATEX FREE (CUSTOM PROCEDURE TRAY) ×1
PACK EP LF (CUSTOM PROCEDURE TRAY) ×1 IMPLANT
PAD DEFIB RADIO PHYSIO CONN (PAD) ×1 IMPLANT
PATCH CARTO3 (PAD) IMPLANT
SHEATH BAYLIS TRANSSEPTAL 98CM (NEEDLE) IMPLANT
SHEATH CARTO VIZIGO SM CVD (SHEATH) IMPLANT
SHEATH PINNACLE 8F 10CM (SHEATH) IMPLANT
SHEATH PINNACLE 9F 10CM (SHEATH) IMPLANT
SHEATH PROBE COVER 6X72 (BAG) IMPLANT
TUBING SMART ABLATE COOLFLOW (TUBING) IMPLANT

## 2021-10-05 NOTE — Anesthesia Preprocedure Evaluation (Addendum)
Anesthesia Evaluation  Patient identified by MRN, date of birth, ID band Patient awake    Reviewed: Allergy & Precautions, NPO status , Patient's Chart, lab work & pertinent test results, reviewed documented beta blocker date and time   History of Anesthesia Complications Negative for: history of anesthetic complications  Airway Mallampati: II  TM Distance: >3 FB Neck ROM: Full    Dental  (+) Dental Advisory Given, Teeth Intact   Pulmonary sleep apnea ,    Pulmonary exam normal        Cardiovascular hypertension, Pt. on home beta blockers and Pt. on medications +CHF  + dysrhythmias Atrial Fibrillation  Rhythm:Irregular Rate:Bradycardia   '23 TTE -EF 45 to 50%. Grade II diastolic dysfunction (pseudonormalization). Aortic valve regurgitation is mild.    Neuro/Psych negative neurological ROS  negative psych ROS   GI/Hepatic Neg liver ROS, GERD  Controlled,  Endo/Other  diabetes, Type 2, Oral Hypoglycemic Agents  Renal/GU negative Renal ROS     Musculoskeletal negative musculoskeletal ROS (+)   Abdominal   Peds  Hematology  On eliquis    Anesthesia Other Findings   Reproductive/Obstetrics                            Anesthesia Physical Anesthesia Plan  ASA: 3  Anesthesia Plan: General   Post-op Pain Management: Tylenol PO (pre-op)*   Induction: Intravenous  PONV Risk Score and Plan: 2 and Treatment may vary due to age or medical condition, Ondansetron and Dexamethasone  Airway Management Planned: Oral ETT  Additional Equipment: None  Intra-op Plan:   Post-operative Plan: Extubation in OR  Informed Consent: I have reviewed the patients History and Physical, chart, labs and discussed the procedure including the risks, benefits and alternatives for the proposed anesthesia with the patient or authorized representative who has indicated his/her understanding and acceptance.      Dental advisory given  Plan Discussed with: CRNA and Anesthesiologist  Anesthesia Plan Comments:        Anesthesia Quick Evaluation

## 2021-10-05 NOTE — H&P (Signed)
Electrophysiology Office Note:     Date:  10/05/2021    ID:  Rosaire Cueto, DOB Jul 19, 1955, MRN [id number]   PCP:  [name], [place] Cardiologist:  Ida Rogue, MD  [facility] Electrophysiologist:  Vickie Epley, MD    Referring MD: [name], FNP    Chief Complaint: Atrial flutter   History of Present Illness:     Cai Anfinson is a 66 y.o. male who presents for an evaluation of atrial flutter at the request of Dr. Rockey Situ. Their medical history includes atrial flutter post ablation in 2778, chronic systolic heart failure, hypertension, hyperlipidemia, sleep apnea and diabetes.  The patient was last seen by Dr. Rockey Situ April 24, 2021.  Before that visit he was previously seen in 2017.  He was diagnosed with atrial fibrillation in September 2022.  He was started back on Eliquis at that time.  In the setting of his rapid ventricular rates he reported shortness of breath and lower extremity edema.  His metoprolol was uptitrated and he was started on Lasix.   He has returned to normal rhythm since I last saw him on amiodarone.  Today he presents for PVI and CTI ablation.   Objective      Past Medical History:  Diagnosis Date   Atrial flutter ([place])      a. 8.2017 s/p RFCA.   Chronic systolic CHF (congestive heart failure) ([place])      a. 09/2015 Echo: EF 40-45%, diff HK.   Essential hypertension     Hyperlipidemia     Hypertension     Overweight     Sleep apnea      "did study 2 days ago; CPAP ordered" (09/26/2015)   Type II diabetes mellitus ([place])             Past Surgical History:  Procedure Laterality Date   COLONOSCOPY WITH PROPOFOL N/A 08/10/2018    Procedure: COLONOSCOPY WITH PROPOFOL;  Surgeon: Virgel Manifold, MD;  Location: [facility] ENDOSCOPY;  Service: Endoscopy;  Laterality: N/A;   ELECTROPHYSIOLOGIC STUDY N/A 09/26/2015    Procedure: A-Flutter;  Surgeon: Deboraha Sprang, MD;  Location: [place] [facility];  Service:  Cardiovascular;  Laterality: N/A;   TEE WITHOUT CARDIOVERSION N/A 08/13/2015    Procedure: TRANSESOPHAGEAL ECHOCARDIOGRAM (TEE) and cardioversion;  Surgeon: Minna Merritts, MD;  Location: [facility] ORS;  Service: Cardiovascular;  Laterality: N/A;      Current Medications: Active Medications      Current Meds  Medication Sig   blood glucose meter kit and supplies KIT Use to check blood sugar once daily Dispense based on patient and insurance preference.   dapagliflozin propanediol (FARXIGA) 5 MG TABS tablet Take 1 tablet (5 mg total) by mouth daily before breakfast.   ELIQUIS 5 MG TABS tablet Take 1 tablet by mouth twice daily   furosemide (LASIX) 40 MG tablet Take 1 tablet (40 mg total) by mouth daily.   metFORMIN (GLUCOPHAGE) 500 MG tablet Take 2 tablets by mouth twice daily   metoprolol succinate (TOPROL-XL) 100 MG 24 hr tablet Take 1 tablet (100 mg total) by mouth 2 (two) times daily.   metoprolol tartrate (LOPRESSOR) 25 MG tablet Take 1 tablet (25 mg total) by mouth as needed. Take for HR >100   potassium chloride SA (KLOR-CON M) 20 MEQ tablet Take 1 tablet (20 mEq total) by mouth daily.   rosuvastatin (CRESTOR) 10 MG tablet Take 1 tablet (10 mg total) by  mouth every other day.   tamsulosin (FLOMAX) 0.4 MG CAPS capsule Take 1 capsule (0.4 mg total) by mouth daily.        Allergies:   Patient has no known allergies.    Social History         Socioeconomic History   Marital status: Married      Spouse name: Not on file   Number of children: Not on file   Years of education: Not on file   Highest education level: Not on file  Occupational History   Not on file  Tobacco Use   Smoking status: Never      Passive exposure: Never   Smokeless tobacco: Never  Vaping Use   Vaping Use: Never used  Substance and Sexual Activity   Alcohol use: No      Alcohol/week: 0.0 standard drinks   Drug use: No      Comment: Used marijuana a few times in college.   Sexual activity: Not Currently   Other Topics Concern   Not on file  Social History Narrative    Lives locally with wife.  Does not routinely exercise.      Social Determinants of Health    Financial Resource Strain: Not on file  Food Insecurity: Not on file  Transportation Needs: Not on file  Physical Activity: Not on file  Stress: Not on file  Social Connections: Not on file      Family History: The patient's family history includes Cancer in his father; Diabetes in his brother; Heart attack in his brother and father; Other (age of onset: 29) in his mother. There is no history of Colon cancer.   ROS:   Please see the history of present illness.    All other systems reviewed and are negative.   EKGs/Labs/Other Studies Reviewed:     The following studies were reviewed today:   April 17, 2021 echo Severely reduced left ventricular function Mildly reduced right ventricular function Mildly dilated left atrium Mild MR   April 24, 2021 EKG Atrial fibrillation with a rapid ventricular rate   December 29, 2018 stress test at [facility] Normal left ventricular function No calcifications seen on the attenuation CT No ischemia or scar     Recent Labs: 10/21/2020: TSH 1.10 04/10/2021: ALT 14; BUN 21; Creatinine, Ser 1.11; Hemoglobin 17.7; Platelets 220.0; Potassium 4.1; Sodium 135  Recent Lipid Panel Labs (Brief)          Component Value Date/Time    CHOL 191 10/21/2020 0903    TRIG 72.0 10/21/2020 0903    HDL 44.40 10/21/2020 0903    CHOLHDL 4 10/21/2020 0903    VLDL 14.4 10/21/2020 0903    LDLCALC 132 (H) 10/21/2020 0903    LDLCALC 120 (H) 02/01/2020 1418        Physical Exam:     VS:  BP 166/85 (BP Location: Left Arm, Patient Position: Sitting, Cuff Size: Normal)   Pulse 54   Ht 6' (1.829 m)   Wt 200 lb (90.7 kg)   SpO2 98%   BMI 27.12 kg/m         Wt Readings from Last 3 Encounters:  05/06/21 200 lb (90.7 kg)  04/24/21 201 lb (91.2 kg)  04/16/21 205 lb (93 kg)      GEN:  Well nourished,  well developed in no acute distress HEENT: Normal NECK: No JVD; No carotid bruits LYMPHATICS: No lymphadenopathy CARDIAC: RRR, no murmurs, rubs, gallops RESPIRATORY:  Clear to auscultation without  rales, wheezing or rhonchi  ABDOMEN: Soft, non-tender, non-distended MUSCULOSKELETAL:  No edema; No deformity  SKIN: Warm and dry NEUROLOGIC:  Alert and oriented x 3 PSYCHIATRIC:  Normal affect          Assessment ASSESSMENT:     1. Chronic systolic heart failure ([place])   2. Typical atrial flutter (H[facility])   3. Persistent atrial fibrillation (H[facility])     PLAN:     In order of problems listed above:   #Chronic systolic heart failure NYHA class II symptoms.  EF 25 to 30%. I suspect this is tachycardia mediated.  Previous normal stress test at [facility] in 2020. Rhythm control be very important Continue medical therapy.  #Atrial fibrillation, persistent #Typical atrial flutter Rhythm control indicated as above.  Continue Eliquis for stroke prophylaxis.  I would like to start amiodarone as a temporary medication to hopefully restore normal rhythm in a more durable fashion and help improve his left ventricular function while we wait for his ablation date.  We will start 400 mg by mouth twice daily for 5 days followed by 400 mg by mouth once daily for 5 days followed by 200 mg by mouth thereafter.  We will plan to cardiovert in 4 weeks.  I discussed the cardioversion procedure in detail with the patient include the risks and he wishes to proceed.  I also discussed catheter ablation in detail during today's visit including the risks, recovery and likelihood of success.  I discussed the possibility of needing future repeat ablations.  He understands and wishes to proceed with scheduling.   Prior to the catheter ablation I would like to get a CT scan protocoled to assess both the coronary arteries and the pulmonary veins.  This should be protocoled as a CT coronary with FFR and we will have a delayed  sequence to assess the pulmonary vein anatomy.   Risk, benefits, and alternatives to EP study and radiofrequency ablation for afib were also discussed in detail today. These risks include but are not limited to stroke, bleeding, vascular damage, tamponade, perforation, damage to the esophagus, lungs, and other structures, pulmonary vein stenosis, worsening renal function, and death. The patient understands these risk and wishes to proceed.  We will therefore proceed with catheter ablation at the next available time.  Carto, ICE, anesthesia are requested for the procedure.  Will also obtain CT PV protocol prior to the procedure to exclude LAA thrombus and further evaluate atrial anatomy.    He presents today for PVI. Procedure reviewed.     Medication Adjustments/Labs and Tests Ordered: Current medicines are reviewed at length with the patient today.  Concerns regarding medicines are outlined above.  No orders of the defined types were placed in this encounter.   No orders of the defined types were placed in this encounter.       Signed, Hilton Cork. Quentin Ore, MD, [facility], [facility] 10/05/2021 Electrophysiology [facility]

## 2021-10-05 NOTE — Anesthesia Postprocedure Evaluation (Signed)
Anesthesia Post Note  Patient: Jeffrey Ortiz  Procedure(s) Performed: ATRIAL FIBRILLATION ABLATION     Patient location during evaluation: PACU Anesthesia Type: General Level of consciousness: awake and alert Pain management: pain level controlled Vital Signs Assessment: post-procedure vital signs reviewed and stable Respiratory status: spontaneous breathing, nonlabored ventilation and respiratory function stable Cardiovascular status: stable and blood pressure returned to baseline Anesthetic complications: no   No notable events documented.  Last Vitals:  Vitals:   10/05/21 1405 10/05/21 1410  BP: (!) 166/75 (!) 167/78  Pulse: 61 (!) 58  Resp: (!) 8 (!) 6  Temp: 36.8 C   SpO2: 97% 96%    Last Pain:  Vitals:   10/05/21 1405  TempSrc: Temporal  PainSc: 0-No pain                 Audry Pili

## 2021-10-05 NOTE — Anesthesia Procedure Notes (Addendum)
Procedure Name: Intubation Date/Time: 10/05/2021 11:40 AM  Performed by: Thelma Comp, CRNAPre-anesthesia Checklist: Patient identified, Emergency Drugs available, Suction available and Patient being monitored Patient Re-evaluated:Patient Re-evaluated prior to induction Oxygen Delivery Method: Circle System Utilized Preoxygenation: Pre-oxygenation with 100% oxygen Induction Type: IV induction Ventilation: Mask ventilation without difficulty, Two handed mask ventilation required and Oral airway inserted - appropriate to patient size Laryngoscope Size: Mac and 4 Grade View: Grade I Tube type: Oral Tube size: 7.5 mm Number of attempts: 1 Airway Equipment and Method: Stylet and Oral airway Placement Confirmation: ETT inserted through vocal cords under direct vision, positive ETCO2 and breath sounds checked- equal and bilateral Secured at: 23 cm Tube secured with: Tape Dental Injury: Teeth and Oropharynx as per pre-operative assessment  Comments: ETT Placed by Florene Route

## 2021-10-05 NOTE — Transfer of Care (Signed)
Immediate Anesthesia Transfer of Care Note  Patient: Jeffrey Ortiz  Procedure(s) Performed: ATRIAL FIBRILLATION ABLATION  Patient Location: PACU and Cath Lab  Anesthesia Type:General  Level of Consciousness: drowsy and patient cooperative  Airway & Oxygen Therapy: Patient Spontanous Breathing and Patient connected to nasal cannula oxygen  Post-op Assessment: Report given to RN and Post -op Vital signs reviewed and stable  Post vital signs: Reviewed and stable  Last Vitals:  Vitals Value Taken Time  BP 177/82 10/05/21 1335  Temp    Pulse 66 10/05/21 1337  Resp 15 10/05/21 1337  SpO2 96 % 10/05/21 1337  Vitals shown include unvalidated device data.  Last Pain:  Vitals:   10/05/21 1139  TempSrc:   PainSc: 0-No pain         Complications: No notable events documented.

## 2021-10-05 NOTE — Discharge Instructions (Signed)

## 2021-10-06 ENCOUNTER — Encounter (HOSPITAL_COMMUNITY): Payer: Self-pay | Admitting: Cardiology

## 2021-10-06 ENCOUNTER — Telehealth: Payer: Self-pay

## 2021-10-06 ENCOUNTER — Other Ambulatory Visit: Payer: Self-pay

## 2021-10-06 NOTE — Telephone Encounter (Signed)
Pt calling stating that his pharmacy [facility] will not refill his medication colchine because of an interaction with medication amiodarone. Pharmacist states that the interaction could increase QT interval. Would Dr. Quentin Ore still want pt to take colchine? Pt would like a call back concerning this matter.

## 2021-10-07 ENCOUNTER — Telehealth: Payer: Self-pay | Admitting: Cardiology

## 2021-10-07 NOTE — Telephone Encounter (Signed)
New Message:      Patient says he would like to talk to Otila Kluver, concerning his procedure he had on Monday(10-05-21) please.

## 2021-10-07 NOTE — Telephone Encounter (Signed)
Talked to the patient, answered all questions about medication and calling in to the office for help.  Verbalized understanding and agreement.

## 2021-10-07 NOTE — Telephone Encounter (Signed)
Called Pharmacy and advised to fill Colchicine per MD.  Verbalized agreement

## 2021-10-07 NOTE — Telephone Encounter (Signed)
Left detailed message to pick up Colchicine from pharmacy.

## 2021-10-12 ENCOUNTER — Telehealth: Payer: Self-pay

## 2021-10-12 ENCOUNTER — Encounter: Payer: Self-pay | Admitting: Family

## 2021-10-12 ENCOUNTER — Ambulatory Visit (INDEPENDENT_AMBULATORY_CARE_PROVIDER_SITE_OTHER): Payer: 59 | Admitting: Family

## 2021-10-12 ENCOUNTER — Other Ambulatory Visit: Payer: Self-pay

## 2021-10-12 VITALS — BP 124/76 | HR 70 | Temp 97.9°F | Ht 72.0 in | Wt 200.0 lb

## 2021-10-12 DIAGNOSIS — G4730 Sleep apnea, unspecified: Secondary | ICD-10-CM | POA: Diagnosis not present

## 2021-10-12 DIAGNOSIS — Z1211 Encounter for screening for malignant neoplasm of colon: Secondary | ICD-10-CM

## 2021-10-12 DIAGNOSIS — I10 Essential (primary) hypertension: Secondary | ICD-10-CM | POA: Diagnosis not present

## 2021-10-12 DIAGNOSIS — E119 Type 2 diabetes mellitus without complications: Secondary | ICD-10-CM | POA: Diagnosis not present

## 2021-10-12 DIAGNOSIS — I480 Paroxysmal atrial fibrillation: Secondary | ICD-10-CM | POA: Diagnosis not present

## 2021-10-12 LAB — POCT GLYCOSYLATED HEMOGLOBIN (HGB A1C): Hemoglobin A1C: 6.6 % — AB (ref 4.0–5.6)

## 2021-10-12 MED ORDER — METOPROLOL SUCCINATE ER 100 MG PO TB24: 100.0000 mg | ORAL_TABLET | Freq: Two times a day (BID) | ORAL | 3 refills | Status: DC

## 2021-10-12 NOTE — Assessment & Plan Note (Signed)
Chronic, stable. Continue metformin 1000 mg twice daily, Farxiga 5 mg daily

## 2021-10-12 NOTE — Telephone Encounter (Signed)
Gastroenterology Pre-Procedure Review  Request Date: Not Scheduled Patient Prefers to go to [facility] for his colonoscopy due to bad experience at [facility].  PCP has been advised to send referral to [place].  Requesting Physician: Dr. Not Scheduled Due to request to go to [facility]  PATIENT REVIEW QUESTIONS: The patient responded to the following health history questions as indicated:    1. Are you having any GI issues? no 2. Do you have a personal history of Polyps? no 3. Do you have a family history of Colon Cancer or Polyps? Sister colon polyps 4. Diabetes Mellitus? yes (type 2 takes metformin) 5. Joint replacements in the past 12 months? Cardiac ablation 10/06/21 6. Major health problems in the past 3 months?no 7. Any artificial heart valves, MVP, or defibrillator?no    MEDICATIONS & ALLERGIES:    Patient reports the following regarding taking any anticoagulation/antiplatelet therapy:   Plavix, Coumadin, Eliquis, Xarelto, Lovenox, Pradaxa, Brilinta, or Effient? Eliquis Dr. Quentin Ore at [facility] Aspirin? no  Patient confirms/reports the following medications:  Current Outpatient Medications  Medication Sig Dispense Refill   amiodarone (PACERONE) 200 MG tablet Take 1 tablet (200 mg total) by mouth daily. 30 tablet 3   apixaban (ELIQUIS) 5 MG TABS tablet Take 1 tablet (5 mg total) by mouth 2 (two) times daily. 90 tablet 1   blood glucose meter kit and supplies KIT Use to check blood sugar once daily Dispense based on patient and insurance preference. 1 each 0   colchicine 0.6 MG tablet Take 1 tablet (0.6 mg total) by mouth 2 (two) times daily for 5 days. 10 tablet 0   dapagliflozin propanediol (FARXIGA) 5 MG TABS tablet Take 1 tablet (5 mg total) by mouth daily before breakfast. 30 tablet 3   furosemide (LASIX) 40 MG tablet Take 1 tablet (40 mg total) by mouth daily. (Patient taking differently: Take 40 mg by mouth daily as needed for fluid or edema.) 30 tablet 3    metFORMIN (GLUCOPHAGE) 500 MG tablet Take 2 tablets by mouth twice daily (Patient taking differently: Take 1,000 mg by mouth 2 (two) times daily with a meal.) 360 tablet 1   metoprolol succinate (TOPROL-XL) 100 MG 24 hr tablet Take 1 tablet (100 mg total) by mouth 2 (two) times daily. 90 tablet 3   metoprolol tartrate (LOPRESSOR) 25 MG tablet Take 1 tablet (25 mg total) by mouth as needed. Take for HR >100 (Patient taking differently: Take 25 mg by mouth daily as needed (Take for HR >100).) 60 tablet 3   pantoprazole (PROTONIX) 40 MG tablet Take 1 tablet (40 mg total) by mouth daily. 45 tablet 0   potassium chloride SA (KLOR-CON M) 20 MEQ tablet Take 1 tablet (20 mEq total) by mouth daily. (Patient taking differently: Take 20 mEq by mouth daily as needed (When take furosemide).) 90 tablet 3   rosuvastatin (CRESTOR) 10 MG tablet Take 1 tablet (10 mg total) by mouth every other day. (Patient taking differently: Take 10 mg by mouth every other day.) 90 tablet 0   tamsulosin (FLOMAX) 0.4 MG CAPS capsule Take 1 capsule (0.4 mg total) by mouth daily. 30 capsule 11   No current facility-administered medications for this visit.    Patient confirms/reports the following allergies:  No Known Allergies  No orders of the defined types were placed in this encounter.   AUTHORIZATION INFORMATION Primary Insurance: 1D#: Group #:  Secondary Insurance: 1D#: Group #:  SCHEDULE INFORMATION: Date: Not Scheduled with [facility] Pt prefers  to go to [place] Time: Location:

## 2021-10-12 NOTE — Assessment & Plan Note (Signed)
Chronic, stable.  Continue toprol '100mg'$  qd

## 2021-10-12 NOTE — Assessment & Plan Note (Signed)
Currently untreated.  Discussed the risk of recurrent atrial fibrillation with untreated sleep apnea.  Patient was very agreeable to arrange a consult pulmonology as likely he would need an updated sleep study.

## 2021-10-12 NOTE — Progress Notes (Signed)
Subjective:    Patient ID: Jeffrey Ortiz, male    DOB: 1955/11/07, 66 y.o.   MRN: [id number]  CC: Jeffrey Ortiz is a 65 y.o. male who presents today for follow up.   HPI: Feels well today.  No new complaints   DM-compliant metformin 1000 mg twice daily, Farxiga 5 mg daily  Hypertension -compliant with metoprolol tartrate 25 mg as needed for heart rate greater than 100. He is compliant with toprol 1110m qd  Takes Lasix 40 mg as needed for leg swelling along with potassium chloride 20 mEq  s/p ablation 10/05/21 Dr LQuentin Orefor paroxysmal atrial fibrillation.  He is compliant with amiodarone 200 mg daily, apixaban 5 mg twice daily  Hyperlipidemia-compliant with Crestor 10 mg daily  Diagnosed with obstructive sleep apnea.  He is currently not wearing cipap.  Denies headaches, fatigue  HISTORY:  Past Medical History:  Diagnosis Date   Atrial flutter ([place]    a. 8.2017 s/p RFCA.   Chronic systolic CHF (congestive heart failure) ([facility]    a. 09/2015 Echo: EF 40-45%, diff HK.   Essential hypertension    Hyperlipidemia    Hypertension    Overweight    Sleep apnea    "did study 2 days ago; CPAP ordered" (09/26/2015)   Type II diabetes mellitus ([place]    Past Surgical History:  Procedure Laterality Date   ATRIAL FIBRILLATION ABLATION N/A 10/05/2021   Procedure: ATRIAL FIBRILLATION ABLATION;  Surgeon: LVickie Epley MD;  [facility];  Service: Cardiovascular;  Laterality: N/A;   COLONOSCOPY WITH PROPOFOL N/A 08/10/2018   Procedure: COLONOSCOPY WITH PROPOFOL;  Surgeon: TVirgel Manifold MD;  Location: [facility];  Service: Endoscopy;  Laterality: N/A;   ELECTROPHYSIOLOGIC STUDY N/A 09/26/2015   Procedure: A-Flutter;  Surgeon: SDeboraha Sprang MD;  [facility];  Service: Cardiovascular;  Laterality: N/A;   TEE WITHOUT CARDIOVERSION N/A 08/13/2015   Procedure: TRANSESOPHAGEAL ECHOCARDIOGRAM (TEE) and cardioversion;  Surgeon: TMinna Merritts MD;   Location: [facility];  Service: Cardiovascular;  Laterality: N/A;   Family History  Problem Relation Age of Onset   Heart attack Father        died in his 667'sw/ cancer but dx CAD in his 549's   Cancer Father    Other Mother 934  Heart attack Brother        died in early 611's   Diabetes Brother    Colon cancer Neg Hx     Allergies: Patient has no known allergies. Current Outpatient Medications on File Prior to Visit  Medication Sig Dispense Refill   amiodarone (PACERONE) 200 MG tablet Take 1 tablet (200 mg total) by mouth daily. 30 tablet 3   apixaban (ELIQUIS) 5 MG TABS tablet Take 1 tablet (5 mg total) by mouth 2 (two) times daily. 90 tablet 1   blood glucose meter kit and supplies KIT Use to check blood sugar once daily Dispense based on patient and insurance preference. 1 each 0   dapagliflozin propanediol (FARXIGA) 5 MG TABS tablet Take 1 tablet (5 mg total) by mouth daily before breakfast. 30 tablet 3   furosemide (LASIX) 40 MG tablet Take 1 tablet (40 mg total) by mouth daily. (Patient taking differently: Take 40 mg by mouth daily as needed for fluid or edema.) 30 tablet 3   metFORMIN (GLUCOPHAGE) 500 MG tablet Take 2 tablets by mouth twice daily (Patient taking differently: Take 1,000 mg by mouth 2 (two) times daily with  a meal.) 360 tablet 1   metoprolol tartrate (LOPRESSOR) 25 MG tablet Take 1 tablet (25 mg total) by mouth as needed. Take for HR >100 (Patient taking differently: Take 25 mg by mouth daily as needed (Take for HR >100).) 60 tablet 3   pantoprazole (PROTONIX) 40 MG tablet Take 1 tablet (40 mg total) by mouth daily. 45 tablet 0   potassium chloride SA (KLOR-CON M) 20 MEQ tablet Take 1 tablet (20 mEq total) by mouth daily. (Patient taking differently: Take 20 mEq by mouth daily as needed (When take furosemide).) 90 tablet 3   rosuvastatin (CRESTOR) 10 MG tablet Take 1 tablet (10 mg total) by mouth every other day. (Patient taking differently: Take 10 mg by mouth every  other day.) 90 tablet 0   tamsulosin (FLOMAX) 0.4 MG CAPS capsule Take 1 capsule (0.4 mg total) by mouth daily. 30 capsule 11   colchicine 0.6 MG tablet Take 1 tablet (0.6 mg total) by mouth 2 (two) times daily for 5 days. 10 tablet 0   No current facility-administered medications on file prior to visit.    Social History   Tobacco Use   Smoking status: Never    Passive exposure: Never   Smokeless tobacco: Never  Vaping Use   Vaping Use: Never used  Substance Use Topics   Alcohol use: No    Alcohol/week: 0.0 standard drinks of alcohol   Drug use: No    Comment: Used marijuana a few times in college.    Review of Systems  Constitutional:  Negative for chills and fever.  Respiratory:  Negative for cough.   Cardiovascular:  Negative for chest pain and palpitations.  Gastrointestinal:  Negative for nausea and vomiting.      Objective:    BP 124/76 (BP Location: Left Arm, Patient Position: Sitting, Cuff Size: Large)   Pulse 70   Temp 97.9 F (36.6 C) (Oral)   Ht 6' (1.829 m)   Wt 200 lb (90.7 kg)   SpO2 96%   BMI 27.12 kg/m  BP Readings from Last 3 Encounters:  10/12/21 124/76  10/05/21 (!) 155/92  09/28/21 (!) 160/87   Wt Readings from Last 3 Encounters:  10/12/21 200 lb (90.7 kg)  10/05/21 202 lb (91.6 kg)  06/01/21 204 lb (92.5 kg)    Physical Exam Vitals reviewed.  Constitutional:      Appearance: He is well-developed.  Cardiovascular:     Rate and Rhythm: Regular rhythm.     Heart sounds: Normal heart sounds.  Pulmonary:     Effort: Pulmonary effort is normal. No respiratory distress.     Breath sounds: Normal breath sounds. No wheezing, rhonchi or rales.  Skin:    General: Skin is warm and dry.  Neurological:     Mental Status: He is alert.  Psychiatric:        Speech: Speech normal.        Behavior: Behavior normal.        Assessment & Plan:   Problem List Items Addressed This Visit       Cardiovascular and Mediastinum   Essential  hypertension (Chronic)    Chronic, stable.  Continue toprol 170m qd      Paroxysmal atrial fibrillation ([place]   Relevant Orders   Ambulatory referral to Pulmonology   TSH     Respiratory   Sleep apnea    Currently untreated.  Discussed the risk of recurrent atrial fibrillation with untreated sleep apnea.  Patient was very agreeable to  arrange a consult pulmonology as likely he would need an updated sleep study.      Relevant Orders   Ambulatory referral to Pulmonology     Endocrine   DM (diabetes mellitus), type 2 ([place]) - Primary    Chronic, stable. Continue metformin 1000 mg twice daily, Farxiga 5 mg daily      Relevant Orders   POCT HgB A1C (Completed)   Microalbumin / creatinine urine ratio   Lipid panel   CBC with Differential/Platelet   Comprehensive metabolic panel     Other   Screen for colon cancer   Relevant Orders   Ambulatory referral to Gastroenterology     I am having Jeffrey Ortiz maintain his metFORMIN, tamsulosin, rosuvastatin, blood glucose meter kit and supplies, potassium chloride SA, amiodarone, dapagliflozin propanediol, furosemide, metoprolol tartrate, apixaban, pantoprazole, and colchicine.   No orders of the defined types were placed in this encounter.   Return precautions given.   Risks, benefits, and alternatives of the medications and treatment plan prescribed today were discussed, and patient expressed understanding.   Education regarding symptom management and diagnosis given to patient on AVS.  Continue to follow with [name], FNP for routine health maintenance.   Jeffrey Ortiz and I agreed with plan.   [name], FNP

## 2021-10-12 NOTE — Patient Instructions (Signed)
Referral to gastroenterology, pulmonology Let us know if you dont hear back within a week in regards to an appointment being scheduled.

## 2021-10-13 ENCOUNTER — Other Ambulatory Visit: Payer: Self-pay | Admitting: Family

## 2021-10-13 DIAGNOSIS — Z1211 Encounter for screening for malignant neoplasm of colon: Secondary | ICD-10-CM

## 2021-10-13 LAB — MICROALBUMIN / CREATININE URINE RATIO
Creatinine,U: 86.9 mg/dL
Microalb Creat Ratio: 1 mg/g (ref 0.0–30.0)
Microalb, Ur: 0.9 mg/dL (ref 0.0–1.9)

## 2021-10-13 NOTE — Progress Notes (Signed)
rerf

## 2021-10-31 ENCOUNTER — Other Ambulatory Visit: Payer: Self-pay | Admitting: Cardiovascular Disease

## 2021-11-02 NOTE — Telephone Encounter (Signed)
Please schedule 6 month F/U appt for refills. Thank you!

## 2021-11-03 ENCOUNTER — Ambulatory Visit (HOSPITAL_COMMUNITY): Payer: 59 | Admitting: Nurse Practitioner

## 2021-11-03 NOTE — Telephone Encounter (Signed)
Left voicemail message requesting for patient to call back to schedule 6 month F/U appt with Dr. Rockey Situ.

## 2021-11-11 ENCOUNTER — Ambulatory Visit: Payer: 59 | Admitting: Cardiology

## 2021-11-18 ENCOUNTER — Ambulatory Visit (HOSPITAL_COMMUNITY): Payer: 59 | Admitting: Physician Assistant

## 2021-11-20 DIAGNOSIS — E119 Type 2 diabetes mellitus without complications: Secondary | ICD-10-CM | POA: Diagnosis not present

## 2021-11-20 LAB — HM DIABETES EYE EXAM

## 2021-11-25 NOTE — Progress Notes (Signed)
ABSTRACT

## 2021-12-08 ENCOUNTER — Ambulatory Visit (HOSPITAL_COMMUNITY): Payer: 59 | Admitting: Nurse Practitioner

## 2021-12-21 ENCOUNTER — Institutional Professional Consult (permissible substitution): Payer: 59 | Admitting: Pulmonary Disease

## 2021-12-22 ENCOUNTER — Ambulatory Visit (HOSPITAL_COMMUNITY)
Admission: RE | Admit: 2021-12-22 | Discharge: 2021-12-22 | Disposition: A | Discharge status: Home / Self Care | Payer: Medicare Other | Source: Ambulatory Visit | Attending: Nurse Practitioner | Admitting: Nurse Practitioner

## 2021-12-22 ENCOUNTER — Encounter (HOSPITAL_COMMUNITY): Payer: Self-pay | Admitting: Nurse Practitioner

## 2021-12-22 VITALS — BP 196/98 | HR 60 | Ht 72.0 in | Wt 213.8 lb

## 2021-12-22 DIAGNOSIS — I10 Essential (primary) hypertension: Secondary | ICD-10-CM

## 2021-12-22 DIAGNOSIS — Z79899 Other long term (current) drug therapy: Secondary | ICD-10-CM | POA: Diagnosis not present

## 2021-12-22 DIAGNOSIS — Z7901 Long term (current) use of anticoagulants: Secondary | ICD-10-CM | POA: Insufficient documentation

## 2021-12-22 DIAGNOSIS — I480 Paroxysmal atrial fibrillation: Secondary | ICD-10-CM | POA: Diagnosis not present

## 2021-12-22 DIAGNOSIS — I4891 Unspecified atrial fibrillation: Secondary | ICD-10-CM | POA: Diagnosis present

## 2021-12-22 DIAGNOSIS — D6869 Other thrombophilia: Secondary | ICD-10-CM | POA: Diagnosis not present

## 2021-12-22 DIAGNOSIS — I4892 Unspecified atrial flutter: Secondary | ICD-10-CM

## 2021-12-22 MED ORDER — AMIODARONE HCL 200 MG PO TABS: 200.0000 mg | ORAL_TABLET | Freq: Every day | ORAL | 1 refills | Status: DC

## 2021-12-22 NOTE — Progress Notes (Signed)
Primary Care Physician: [name], FNP Referring Physician:Dr. Quentin Ore Cardiologist: Dr. Jolee Ewing Faux is a 66 y.o. male with a h/o afib , s/p ablation 10/05/21. He is in the afib clinic for f/u. He reports that he has not noted any afib. His BP is elevated today on presentation at 196/98. On recheck 160 /108. He states he ate salty fires last night. His lisinopril was stopped last fall with the addition of high dose metoprolol. He is being complaint with eliquis 5 mg bid. He continues on amiodarone 200 mg daily.No swallowing or groin issues, back to usual activities.    Today, he denies symptoms of palpitations, chest pain, shortness of breath, orthopnea, PND, lower extremity edema, dizziness, presyncope, syncope, or neurologic sequela. The patient is tolerating medications without difficulties and is otherwise without complaint today.   Past Medical History:  Diagnosis Date   Atrial flutter ([place])    a. 8.2017 s/p RFCA.   Chronic systolic CHF (congestive heart failure) ([place])    a. 09/2015 Echo: EF 40-45%, diff HK.   Essential hypertension    Hyperlipidemia    Hypertension    Overweight    Sleep apnea    "did study 2 days ago; CPAP ordered" (09/26/2015)   Type II diabetes mellitus ([place])    Past Surgical History:  Procedure Laterality Date   ATRIAL FIBRILLATION ABLATION N/A 10/05/2021   Procedure: ATRIAL FIBRILLATION ABLATION;  Surgeon: Vickie Epley, MD;  Location: [facility] [facility];  Service: Cardiovascular;  Laterality: N/A;   COLONOSCOPY WITH PROPOFOL N/A 08/10/2018   Procedure: COLONOSCOPY WITH PROPOFOL;  Surgeon: Virgel Manifold, MD;  Location: [facility];  Service: Endoscopy;  Laterality: N/A;   ELECTROPHYSIOLOGIC STUDY N/A 09/26/2015   Procedure: A-Flutter;  Surgeon: Deboraha Sprang, MD;  Location: [place] [facility];  Service: Cardiovascular;  Laterality: N/A;   TEE WITHOUT CARDIOVERSION N/A 08/13/2015   Procedure: TRANSESOPHAGEAL ECHOCARDIOGRAM  (TEE) and cardioversion;  Surgeon: Minna Merritts, MD;  Location: [facility];  Service: Cardiovascular;  Laterality: N/A;    Current Outpatient Medications  Medication Sig Dispense Refill   amiodarone (PACERONE) 200 MG tablet Take 1 tablet (200 mg total) by mouth daily. Please call to schedule an overdue appointment with Dr. Quentin Ore for refills, [phone], thank you. 1st attempt. 90 tablet 0   apixaban (ELIQUIS) 5 MG TABS tablet Take 1 tablet (5 mg total) by mouth 2 (two) times daily. 90 tablet 1   blood glucose meter kit and supplies KIT Use to check blood sugar once daily Dispense based on patient and insurance preference. 1 each 0   dapagliflozin propanediol (FARXIGA) 5 MG TABS tablet Take 1 tablet (5 mg total) by mouth daily before breakfast. 30 tablet 3   furosemide (LASIX) 40 MG tablet Take 1 tablet (40 mg total) by mouth daily. (Patient taking differently: Take 40 mg by mouth daily as needed for fluid or edema.) 30 tablet 3   metFORMIN (GLUCOPHAGE) 500 MG tablet Take 2 tablets by mouth twice daily (Patient taking differently: Take 1,000 mg by mouth 2 (two) times daily with a meal.) 360 tablet 1   metoprolol succinate (TOPROL-XL) 100 MG 24 hr tablet Take 1 tablet (100 mg total) by mouth 2 (two) times daily. 90 tablet 3   metoprolol tartrate (LOPRESSOR) 25 MG tablet Take 1 tablet (25 mg total) by mouth as needed. Take for HR >100 (Patient taking differently: Take 25 mg by mouth daily as needed (Take for HR >100).)  60 tablet 3   potassium chloride SA (KLOR-CON M) 20 MEQ tablet Take 1 tablet (20 mEq total) by mouth daily. (Patient taking differently: Take 20 mEq by mouth daily as needed (When take furosemide).) 90 tablet 3   tamsulosin (FLOMAX) 0.4 MG CAPS capsule Take 1 capsule (0.4 mg total) by mouth daily. 30 capsule 11   rosuvastatin (CRESTOR) 10 MG tablet Take 1 tablet (10 mg total) by mouth every other day. (Patient not taking: Reported on 12/22/2021) 90 tablet 0   No current  facility-administered medications for this encounter.    No Known Allergies  Social History   Socioeconomic History   Marital status: Married    Spouse name: Not on file   Number of children: Not on file   Years of education: Not on file   Highest education level: Not on file  Occupational History   Not on file  Tobacco Use   Smoking status: Never    Passive exposure: Never   Smokeless tobacco: Never  Vaping Use   Vaping Use: Never used  Substance and Sexual Activity   Alcohol use: No    Alcohol/week: 0.0 standard drinks of alcohol   Drug use: No    Comment: Used marijuana a few times in college.   Sexual activity: Not Currently  Other Topics Concern   Not on file  Social History Narrative   Lives locally with wife.  Does not routinely exercise.     Social Determinants of Health   Financial Resource Strain: Not on file  Food Insecurity: Not on file  Transportation Needs: Not on file  Physical Activity: Not on file  Stress: Not on file  Social Connections: Not on file  Intimate Partner Violence: Not on file    Family History  Problem Relation Age of Onset   Heart attack Father        died in his 58's w/ cancer but dx CAD in his 49's.   Cancer Father    Other Mother 32   Heart attack Brother        died in early 46's.   Diabetes Brother    Colon cancer Neg Hx     ROS- All systems are reviewed and negative except as per the HPI above  Physical Exam: Vitals:   12/22/21 1502  BP: (!) 196/98  Pulse: 60  Weight: 97 kg  Height: 6' (1.829 m)   Wt Readings from Last 3 Encounters:  12/22/21 97 kg  10/12/21 90.7 kg  10/05/21 91.6 kg    Labs: Lab Results  Component Value Date   NA 139 10/05/2021   K 3.8 10/05/2021   CL 109 10/05/2021   CO2 23 10/05/2021   GLUCOSE 125 (H) 10/05/2021   BUN 15 10/05/2021   CREATININE 1.03 10/05/2021   CALCIUM 8.5 (L) 10/05/2021   MG 2.1 08/15/2015   No results found for: "INR" Lab Results  Component Value Date    CHOL 191 10/21/2020   HDL 44.40 10/21/2020   LDLCALC 132 (H) 10/21/2020   TRIG 72.0 10/21/2020     GEN- The patient is well appearing, alert and oriented x 3 today.   Head- normocephalic, atraumatic Eyes-  Sclera clear, conjunctiva pink Ears- hearing intact Oropharynx- clear Neck- supple, no JVP Lymph- no cervical lymphadenopathy Lungs- Clear to ausculation bilaterally, normal work of breathing Heart- Regular rate and rhythm, no murmurs, rubs or gallops, PMI not laterally displaced GI- soft, NT, ND, + BS Extremities- no clubbing, cyanosis, or edema MS-  no significant deformity or atrophy Skin- no rash or lesion Psych- euthymic mood, full affect Neuro- strength and sensation are intact  EKG-Vent. rate 60 BPM PR interval 162 ms QRS duration 94 ms QT/QTcB 470/470 ms P-R-T axes 57 57 62 Normal sinus rhythm Normal ECG When compared with ECG of 05-Oct-2021 13:49, PREVIOUS ECG IS PRESENT    Assessment and Plan:  1. Afib Is SR Quiet since ablation  Continue amiodarone 200 mg daily Continue metoprolol succinate 100 mg bid   2. CHA2DS2VASc score of at least 4 Continue eliquis 5 mg bid   3. HTN Elevated today at 196/68 Recheck 160/108 States lisinopril was d/ced last fall to allow for more room  to add rate control  Check later tonight at home and if still elevated check with Dr. Donivan Scull office tomorrow  to see if lisinopril should be restarted  Avoid salt   Butch Penny C. Chealsea Paske, [facility] [address] [place], [place] [address] [phone]

## 2021-12-25 ENCOUNTER — Encounter: Payer: Self-pay | Admitting: Gastroenterology

## 2021-12-28 NOTE — Progress Notes (Signed)
Patient ID: Jeffrey Ortiz, male   DOB: 03-Jan-1956, 66 y.o.   MRN: [id number] Cardiology Office Note  Date:  12/29/2021   ID:  Jeffrey Ortiz, DOB 1955-09-04, MRN [id number]  PCP:  [name], FNP   Chief Complaint  Patient presents with   6 month follow up     Patient had a A-Fib ablation on 10/06/2021. Medications reviewed by the patient verbally. "Doing well."     HPI:  [name] is a 66 yo-old gentleman with a history of  GERD,  diabetes, hypertension, hyperlipidemia,  hospital 08/11/2015 with worsening shortness of breath, orthopnea, PND after severe GERD, vomiting, aspiration into his sinuses noted to be in atrial flutter with RVR underwent TEE with cardioversion Cardioversion was initially successful though converted to atrial fibrillation Later back on the floor he went back into atrial flutter Atrial flutter-recurrent-typical  S/p ablation  Last seen in clinic 2017 Cardiomyopathy ejection fraction 25%-30% March 2023 while in atrial fibrillation He presents for follow-up of his atrial fibrillation, persistent  LOV April 2023  Seen by EP March 2023 Started on amiodarone Underwent cardioversion,  Ablation 10/06/21  Ablation reports that he has been feeling well, denies any tachypalpitations concerning for breakthrough arrhythmia Denies leg swelling, no PND orthopnea Reports blood pressure has been running high  Prior cardiac testing reviewed Echocardiogram 06/19/21 Ejection fraction has improved up to 45 to 50%, previously was 25 to 30%  Continues to work at shoe store  EKG personally reviewed by myself on todays visit Sinus bradycardia rate 54 bpm no significant ST or T wave changes  Past medical history reviewed Covid 07/25/2020   PMH:   has a past medical history of Atrial flutter ([place]), Chronic systolic CHF (congestive heart failure) ([place]), Essential hypertension, Hyperlipidemia, Hypertension, Overweight, Sleep apnea, and Type II diabetes mellitus  ([place]).  PSH:    Past Surgical History:  Procedure Laterality Date   ATRIAL FIBRILLATION ABLATION N/A 10/05/2021   Procedure: ATRIAL FIBRILLATION ABLATION;  Surgeon: Vickie Epley, MD;  Location: [facility];  Service: Cardiovascular;  Laterality: N/A;   COLONOSCOPY WITH PROPOFOL N/A 08/10/2018   Procedure: COLONOSCOPY WITH PROPOFOL;  Surgeon: Virgel Manifold, MD;  Location: [facility];  Service: Endoscopy;  Laterality: N/A;   ELECTROPHYSIOLOGIC STUDY N/A 09/26/2015   Procedure: A-Flutter;  Surgeon: Deboraha Sprang, MD;  Location: [facility];  Service: Cardiovascular;  Laterality: N/A;   TEE WITHOUT CARDIOVERSION N/A 08/13/2015   Procedure: TRANSESOPHAGEAL ECHOCARDIOGRAM (TEE) and cardioversion;  Surgeon: Minna Merritts, MD;  Location: [facility];  Service: Cardiovascular;  Laterality: N/A;    Current Outpatient Medications  Medication Sig Dispense Refill   amiodarone (PACERONE) 200 MG tablet Take 1 tablet (200 mg total) by mouth daily. 90 tablet 1   apixaban (ELIQUIS) 5 MG TABS tablet Take 1 tablet (5 mg total) by mouth 2 (two) times daily. 90 tablet 1   blood glucose meter kit and supplies KIT Use to check blood sugar once daily Dispense based on patient and insurance preference. 1 each 0   dapagliflozin propanediol (FARXIGA) 5 MG TABS tablet Take 1 tablet (5 mg total) by mouth daily before breakfast. 30 tablet 3   furosemide (LASIX) 40 MG tablet Take 1 tablet (40 mg total) by mouth daily. (Patient taking differently: Take 40 mg by mouth daily as needed for fluid or edema.) 30 tablet 3   metFORMIN (GLUCOPHAGE) 500 MG tablet Take 2 tablets by mouth twice daily (Patient taking differently: Take 1,000 mg  by mouth 2 (two) times daily with a meal.) 360 tablet 1   metoprolol succinate (TOPROL-XL) 100 MG 24 hr tablet Take 1 tablet (100 mg total) by mouth 2 (two) times daily. 90 tablet 3   metoprolol tartrate (LOPRESSOR) 25 MG tablet Take 1 tablet (25 mg total) by mouth as  needed. Take for HR >100 (Patient taking differently: Take 25 mg by mouth daily as needed (Take for HR >100).) 60 tablet 3   tamsulosin (FLOMAX) 0.4 MG CAPS capsule Take 1 capsule (0.4 mg total) by mouth daily. 30 capsule 11   potassium chloride SA (KLOR-CON M) 20 MEQ tablet Take 1 tablet (20 mEq total) by mouth daily. (Patient not taking: Reported on 12/29/2021) 90 tablet 3   rosuvastatin (CRESTOR) 10 MG tablet Take 1 tablet (10 mg total) by mouth every other day. (Patient not taking: Reported on 12/29/2021) 90 tablet 0   No current facility-administered medications for this visit.     Allergies:   Patient has no known allergies.   Social History:  The patient  reports that he has never smoked. He has never been exposed to tobacco smoke. He has never used smokeless tobacco. He reports that he does not drink alcohol and does not use drugs.   Family History:   family history includes Cancer in his father; Diabetes in his brother; Heart attack in his brother and father; Other (age of onset: 1) in his mother.    Review of Systems: Review of Systems  Constitutional: Negative.   HENT: Negative.    Respiratory: Negative.    Cardiovascular: Negative.   Gastrointestinal: Negative.   Musculoskeletal: Negative.   Neurological: Negative.   Psychiatric/Behavioral: Negative.    All other systems reviewed and are negative.   PHYSICAL EXAM: VS:  BP (!) 140/82 (BP Location: Left Arm, Patient Position: Sitting, Cuff Size: Normal)   Pulse (!) 54   Ht 6' (1.829 m)   Wt 210 lb 8 oz (95.5 kg)   SpO2 98%   BMI 28.55 kg/m  , BMI Body mass index is 28.55 kg/m. Constitutional:  oriented to person, place, and time. No distress.  HENT:  Head: Grossly normal Eyes:  no discharge. No scleral icterus.  Neck: No JVD, no carotid bruits  Cardiovascular: Regular rate and rhythm, no murmurs appreciated Pulmonary/Chest: Clear to auscultation bilaterally, no wheezes or rails Abdominal: Soft.  no distension.   no tenderness.  Musculoskeletal: Normal range of motion Neurological:  normal muscle tone. Coordination normal. No atrophy Skin: Skin warm and dry Psychiatric: normal affect, pleasant  Recent Labs: 04/10/2021: ALT 14 10/05/2021: BUN 15; Creatinine, Ser 1.03; Hemoglobin 17.1; Platelets 219; Potassium 3.8; Sodium 139    Lipid Panel Lab Results  Component Value Date   CHOL 191 10/21/2020   HDL 44.40 10/21/2020   LDLCALC 132 (H) 10/21/2020   TRIG 72.0 10/21/2020      Wt Readings from Last 3 Encounters:  12/29/21 210 lb 8 oz (95.5 kg)  12/22/21 213 lb 12.8 oz (97 kg)  10/12/21 200 lb (90.7 kg)     ASSESSMENT AND PLAN:  Atrial flutter with rapid ventricular response (HCC)  Prior ablation On metoprolol, amiodarone, Eliquis Maintaining normal sinus rhythm  Persistent atrial fibrillation Recent ablation August 2023 Recommended continue amiodarone metoprolol Eliquis, discussed any medication changes with Dr. Quentin Ore  Essential hypertension Recommend he add losartan 50 daily  Cardiomyopathy Ejection fraction has improved up to 45 to 50%, previously was 25 to 30% Continue Farxiga, metoprolol succinate We will add  losartan 50 daily Could consider Entresto   Total encounter time more than 30  minutes  Greater than 50% was spent in counseling and coordination of care with the patient   No orders of the defined types were placed in this encounter.    Signed, Esmond Plants, M.D., Ph.D. 12/29/2021  [place], [place]

## 2021-12-29 ENCOUNTER — Encounter: Payer: Self-pay | Admitting: Cardiovascular Disease

## 2021-12-29 ENCOUNTER — Ambulatory Visit: Payer: Medicare Other | Attending: Cardiovascular Disease | Admitting: Cardiovascular Disease

## 2021-12-29 VITALS — BP 140/82 | HR 54 | Ht 72.0 in | Wt 210.5 lb

## 2021-12-29 DIAGNOSIS — E119 Type 2 diabetes mellitus without complications: Secondary | ICD-10-CM | POA: Diagnosis not present

## 2021-12-29 DIAGNOSIS — I480 Paroxysmal atrial fibrillation: Secondary | ICD-10-CM | POA: Diagnosis not present

## 2021-12-29 MED ORDER — LOSARTAN POTASSIUM 50 MG PO TABS: 50.0000 mg | ORAL_TABLET | Freq: Every day | ORAL | 3 refills | Status: DC

## 2021-12-29 NOTE — Patient Instructions (Addendum)
Medication Instructions:   Please start losartan 50 mg daily  If you need a refill on your cardiac medications before your next appointment, please call your pharmacy.   Lab work:  No new labs needed  Testing/Procedures:  No new testing needed  Follow-Up: At [facility], you and your health needs are our priority.  As part of our continuing mission to provide you with exceptional heart care, we have created designated Provider Care Teams.  These Care Teams include your primary Cardiologist (physician) and Advanced Practice Providers (APPs -  Physician Assistants and Nurse Practitioners) who all work together to provide you with the care you need, when you need it.  You will need a follow up appointment in 12 months  Providers on your designated Care Team:   Murray Hodgkins, NP Christell Faith, PA-C Cadence Kathlen Mody, [place]  COVID-19 Vaccine Information can be found at: [email or web address] For questions related to vaccine distribution or appointments, please email [email or web address] .[email or web address] or call [phone].

## 2022-01-06 ENCOUNTER — Ambulatory Visit: Payer: 59 | Admitting: Cardiology

## 2022-01-22 ENCOUNTER — Telehealth: Payer: Self-pay | Admitting: Cardiovascular Disease

## 2022-01-22 NOTE — Telephone Encounter (Signed)
Returned the call to the patient. He stated that he went to pick up his Eliquis and Wilder Glade and was told Medicaid would not approve this anymore.   Call placed to [place]. They stated that he is either in the donut hole or that the $500 is his new deductible. He stated that for now, he has enough Eliquis but is out of [place]. He has been advised that we do not have any samples of Farxiga and wanted to know if he could switch to [place].

## 2022-01-22 NOTE — Telephone Encounter (Signed)
Pt c/o medication issue:  1. Name of Medication:   apixaban (ELIQUIS) 5 MG TABS tablet  dapagliflozin propanediol (FARXIGA) 5 MG TABS tablet   2. How are you currently taking this medication (dosage and times per day)?   As prescribed  3. Are you having a reaction (difficulty breathing--STAT)?   No  4. What is your medication issue?   Patient stated he started on Medicaid and they do not cover these medications.  Patient would like a call back to discuss how he can get this medication.

## 2022-01-27 MED ORDER — EMPAGLIFLOZIN 10 MG PO TABS: 10.0000 mg | ORAL_TABLET | Freq: Every day | ORAL | 6 refills | Status: DC

## 2022-01-27 NOTE — Telephone Encounter (Signed)
I spoke with the patient. I have advised him of Dr. Donivan Scull recommendations that we could change his [place] to [place]. I inquired if he knew if he would receive better coverage with Jardiance.  Per the patient, he is now on Medicare since November. His Eliquis and Wilder Glade are ~ $600 for a piece for a month's supply.  The patient states he was on Jardiance before and did fine with this.   I have advised the patient that at the 1st of the year his co-pays may go down for his medications.  He would prefer restarting the Jardiance.   I have advised the patient I will pull a couple of weeks worth of samples of Eliquis and Jardiance and he can pick these up at his convenience.  He is aware I will send a RX to the pharmacy for Jardiance and give him a free trial card if available.  The patient voices understanding and is agreeable.   Samples Given: Eliquis 5 mg Lot: [id number] Exp: June 2025 # 2 boxes  Jardiance 10 mg Lot: [id number] Exp: 12/2023 # 2 boxes & a free 14 day trial card.

## 2022-01-27 NOTE — Telephone Encounter (Signed)
Dr. Rockey Situ- this patient is calling back.  Please advise regarding a switch to Jardiance.

## 2022-01-27 NOTE — Telephone Encounter (Signed)
Follow Up:      Patient is calling back to see what was decided.

## 2022-02-03 ENCOUNTER — Ambulatory Visit: Payer: Medicare Other | Attending: Cardiology | Admitting: Cardiology

## 2022-02-03 ENCOUNTER — Ambulatory Visit: Payer: 59 | Admitting: Gastroenterology

## 2022-02-03 ENCOUNTER — Encounter: Payer: Self-pay | Admitting: Cardiology

## 2022-02-03 NOTE — Progress Notes (Deleted)
Electrophysiology Office Follow up Visit Note:    Date:  02/03/2022   ID:  Jeffrey Ortiz, DOB Dec 28, 1955, MRN [id number]  PCP:  [name], [place] Cardiologist:  Ida Rogue, MD  [facility] Electrophysiologist:  Vickie Epley, MD    Interval History:    Jeffrey Ortiz is a 66 y.o. male who presents for a follow up visit.   He had an A-fib ablation on October 05, 2021.  During that procedure, the pulmonary veins and CTI were ablated.  He has done well following the ablation without recurrence of arrhythmia.  He saw Butch Penny in clinic December 22, 2021 and was doing well without recurrence.  He saw Esmond Plants on December 29, 2021 is also doing well.       Past Medical History:  Diagnosis Date   Atrial flutter ([place])    a. 8.2017 s/p RFCA.   Chronic systolic CHF (congestive heart failure) ([employer])    a. 09/2015 Echo: EF 40-45%, diff HK.   Essential hypertension    Hyperlipidemia    Hypertension    Overweight    Sleep apnea    "did study 2 days ago; CPAP ordered" (09/26/2015)   Type II diabetes mellitus ([place])     Past Surgical History:  Procedure Laterality Date   ATRIAL FIBRILLATION ABLATION N/A 10/05/2021   Procedure: ATRIAL FIBRILLATION ABLATION;  Surgeon: Vickie Epley, MD;  Location: [place] [facility];  Service: Cardiovascular;  Laterality: N/A;   COLONOSCOPY WITH PROPOFOL N/A 08/10/2018   Procedure: COLONOSCOPY WITH PROPOFOL;  Surgeon: Virgel Manifold, MD;  Location: [facility] ENDOSCOPY;  Service: Endoscopy;  Laterality: N/A;   ELECTROPHYSIOLOGIC STUDY N/A 09/26/2015   Procedure: A-Flutter;  Surgeon: Deboraha Sprang, MD;  Location: [place] [facility];  Service: Cardiovascular;  Laterality: N/A;   TEE WITHOUT CARDIOVERSION N/A 08/13/2015   Procedure: TRANSESOPHAGEAL ECHOCARDIOGRAM (TEE) and cardioversion;  Surgeon: Minna Merritts, MD;  Location: [facility] ORS;  Service: Cardiovascular;  Laterality: N/A;    Current Medications: No outpatient  medications have been marked as taking for the 02/03/22 encounter (Appointment) with Vickie Epley, MD.     Allergies:   Patient has no known allergies.   Social History   Socioeconomic History   Marital status: Married    Spouse name: Not on file   Number of children: Not on file   Years of education: Not on file   Highest education level: Not on file  Occupational History   Not on file  Tobacco Use   Smoking status: Never    Passive exposure: Never   Smokeless tobacco: Never  Vaping Use   Vaping Use: Never used  Substance and Sexual Activity   Alcohol use: No    Alcohol/week: 0.0 standard drinks of alcohol   Drug use: No    Comment: Used marijuana a few times in college.   Sexual activity: Not Currently  Other Topics Concern   Not on file  Social History Narrative   Lives locally with wife.  Does not routinely exercise.     Social Determinants of Health   Financial Resource Strain: Not on file  Food Insecurity: Not on file  Transportation Needs: Not on file  Physical Activity: Not on file  Stress: Not on file  Social Connections: Not on file     Family History: The patient's family history includes Cancer in his father; Diabetes in his brother; Heart attack in his brother and father; Other (age of onset: 41)  in his mother. There is no history of Colon cancer.  ROS:   Please see the history of present illness.    All other systems reviewed and are negative.  EKGs/Labs/Other Studies Reviewed:    The following studies were reviewed today:    EKG:  The ekg ordered today demonstrates ***  Recent Labs: 04/10/2021: ALT 14 10/05/2021: BUN 15; Creatinine, Ser 1.03; Hemoglobin 17.1; Platelets 219; Potassium 3.8; Sodium 139  Recent Lipid Panel    Component Value Date/Time   CHOL 191 10/21/2020 0903   TRIG 72.0 10/21/2020 0903   HDL 44.40 10/21/2020 0903   CHOLHDL 4 10/21/2020 0903   VLDL 14.4 10/21/2020 0903   LDLCALC 132 (H) 10/21/2020 0903   LDLCALC  120 (H) 02/01/2020 1418    Physical Exam:    VS:  There were no vitals taken for this visit.    Wt Readings from Last 3 Encounters:  12/29/21 210 lb 8 oz (95.5 kg)  12/22/21 213 lb 12.8 oz (97 kg)  10/12/21 200 lb (90.7 kg)     GEN: *** Well nourished, well developed in no acute distress HEENT: Normal NECK: No JVD; No carotid bruits LYMPHATICS: No lymphadenopathy CARDIAC: ***RRR, no murmurs, rubs, gallops RESPIRATORY:  Clear to auscultation without rales, wheezing or rhonchi  ABDOMEN: Soft, non-tender, non-distended MUSCULOSKELETAL:  No edema; No deformity  SKIN: Warm and dry NEUROLOGIC:  Alert and oriented x 3 PSYCHIATRIC:  Normal affect        ASSESSMENT:    1. Paroxysmal atrial fibrillation (HCC)   2. Atrial flutter, unspecified type ([place])   3. Encounter for long-term (current) use of high-risk medication   4. Chronic systolic heart failure (HCC)    PLAN:    In order of problems listed above:   #Persistent atrial fibrillation and flutter Doing well after his ablation. He can stop amiodarone today. Continue Eliquis for stroke prophylaxis  #Chronic systolic heart failure EF had improved on the Jun 19, 2021 echo while in normal sinus rhythm.  Rhythm control indicated as above.  NYHA class II today.  Warm and dry on exam.  Continue Jardiance, Lasix, losartan, metoprolol.  Continue follow-up with general cardiology.  Follow-up with APP in 6 months.      Medication Adjustments/Labs and Tests Ordered: Current medicines are reviewed at length with the patient today.  Concerns regarding medicines are outlined above.  No orders of the defined types were placed in this encounter.  No orders of the defined types were placed in this encounter.    Signed, Lars Mage, MD, [facility], [facility] 02/03/2022 8:17 AM    Electrophysiology [facility]

## 2022-02-07 ENCOUNTER — Other Ambulatory Visit: Payer: Self-pay | Admitting: Cardiovascular Disease

## 2022-02-07 DIAGNOSIS — I480 Paroxysmal atrial fibrillation: Secondary | ICD-10-CM

## 2022-02-07 DIAGNOSIS — I4892 Unspecified atrial flutter: Secondary | ICD-10-CM

## 2022-03-08 ENCOUNTER — Encounter: Payer: Self-pay | Admitting: Gastroenterology

## 2022-03-08 ENCOUNTER — Telehealth: Payer: Self-pay

## 2022-03-08 ENCOUNTER — Ambulatory Visit (INDEPENDENT_AMBULATORY_CARE_PROVIDER_SITE_OTHER): Payer: Medicare Other | Admitting: Gastroenterology

## 2022-03-08 VITALS — BP 160/60 | HR 86 | Ht 72.0 in | Wt 205.1 lb

## 2022-03-08 DIAGNOSIS — Z1211 Encounter for screening for malignant neoplasm of colon: Secondary | ICD-10-CM

## 2022-03-08 DIAGNOSIS — Z7902 Long term (current) use of antithrombotics/antiplatelets: Secondary | ICD-10-CM

## 2022-03-08 DIAGNOSIS — I5022 Chronic systolic (congestive) heart failure: Secondary | ICD-10-CM

## 2022-03-08 DIAGNOSIS — G4730 Sleep apnea, unspecified: Secondary | ICD-10-CM

## 2022-03-08 DIAGNOSIS — I480 Paroxysmal atrial fibrillation: Secondary | ICD-10-CM

## 2022-03-08 MED ORDER — NA SULFATE-K SULFATE-MG SULF 17.5-3.13-1.6 GM/177ML PO SOLN: 1.0000 | Freq: Once | ORAL | 0 refills | Status: AC

## 2022-03-08 NOTE — Telephone Encounter (Signed)
Patient with diagnosis of afib on Eliquis for anticoagulation.    Procedure: colonoscopy Date of procedure: 04/14/22   CHA2DS2-VASc Score = 4   This indicates a 4.8% annual risk of stroke. The patient's score is based upon: CHF History: 1 HTN History: 1 Diabetes History: 1 Stroke History: 0 Vascular Disease History: 0 Age Score: 1 Gender Score: 0      CrCl 85 ml/min  Per office protocol, patient can hold Eliquis for 2 days prior to procedure.    **This guidance is not considered finalized until pre-operative APP has relayed final recommendations.**

## 2022-03-08 NOTE — Patient Instructions (Signed)
_______________________________________________________  If your blood pressure at your visit was 140/90 or greater, please contact your primary care physician to follow up on this.  _______________________________________________________  If you are age 66 or older, your body mass index should be between 23-30. Your Body mass index is 27.82 kg/m. If this is out of the aforementioned range listed, please consider follow up with your Primary Care Provider.  If you are age 66 or younger, your body mass index should be between 19-25. Your Body mass index is 27.82 kg/m. If this is out of the aformentioned range listed, please consider follow up with your Primary Care Provider.   ________________________________________________________  The [facility] providers would like to encourage you to use [facility] to communicate with providers for non-urgent requests or questions.  Due to long hold times on the telephone, sending your provider a message by [facility] may be a faster and more efficient way to get a response.  Please allow 48 business hours for a response.  Please remember that this is for non-urgent requests.  _______________________________________________________  Jeffrey Ortiz have been scheduled for a colonoscopy. Please follow written instructions given to you at your visit today.  Please pick up your prep supplies at the pharmacy within the next 1-3 days. If you use inhalers (even only as needed), please bring them with you on the day of your procedure.  Due to recent changes in healthcare laws, you may see the results of your imaging and laboratory studies on MyChart before your provider has had a chance to review them.  We understand that in some cases there may be results that are confusing or concerning to you. Not all laboratory results come back in the same time frame and the provider may be waiting for multiple results in order to interpret others.  Please give [place] 48 hours in order for your  provider to thoroughly review all the results before contacting the office for clarification of your results.   It was a pleasure to see you today!  Thank you for trusting me with your gastrointestinal care!

## 2022-03-08 NOTE — Telephone Encounter (Signed)
   Patient Name: Jeffrey Ortiz  DOB: 1955-07-18 MRN: [id number]  Primary Cardiologist: Ida Rogue, MD  Clinical pharmacists have reviewed the patient's past medical history, labs, and current medications as part of preoperative protocol coverage. The following recommendations have been made:  Patient with diagnosis of afib on Eliquis for anticoagulation.     Procedure: colonoscopy Date of procedure: 04/14/22     CHA2DS2-VASc Score = 4  This indicates a 4.8% annual risk of stroke. The patient's score is based upon: CHF History: 1 HTN History: 1 Diabetes History: 1 Stroke History: 0 Vascular Disease History: 0 Age Score: 1 Gender Score: 0      CrCl 85 ml/min   Per office protocol, patient can hold Eliquis for 2 days prior to procedure.  Please resume Eliquis as soon as possible postprocedure, at the discretion of the surgeon.   I will route this recommendation to the requesting party via Epic fax function and remove from pre-op pool.  Please call with questions.  Lenna Sciara, NP 03/08/2022, 4:28 PM

## 2022-03-08 NOTE — Telephone Encounter (Signed)
[facility] Pre-operative Risk Assessment     Request for surgical clearance:     Endoscopy Procedure  What type of surgery is being performed?     Colonoscopy  When is this surgery scheduled?     04-14-2022  What type of clearance is required ?   Pharmacy  Are there any medications that need to be held prior to surgery and how long? Yes, Eliquis 2 day hold request   Practice name and name of physician performing surgery?      [facility]  What is your office phone and fax number?      Phone- [phone]  Fax423 [phone]  Anesthesia type (None, local, MAC, general) ?       MAC

## 2022-03-08 NOTE — Progress Notes (Signed)
[facility] Consult Note:  History: Jeffrey Ortiz 03/08/2022  Referring provider: [name], FNP  Reason for consult/chief complaint: Colonoscopy (Discuss having ) and Blood In Stools   Subjective  HPI:  Jeffrey Ortiz is a very poor 67 year old man referred to [place] for screening colonoscopy. Scheduled for screening colonoscopy with [facility] in 2020, but canceled for some reason (may not have had preprocedure COVID testing?).  Scheduled again in March 2023, but canceled because he was found to be in recurrent A-fib. Chart review indicates he had persistent A-fib with low LVEF, then underwent successful A-fib ablation August 2023.  Excerpt from 01/08/2022 cardiology office note below.  Jeffrey Ortiz says he is feeling quite well these days.  He denies chest pain or dyspnea.  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  Bowel habits are regular without chronic abdominal pain.  He has some occasional painless rectal bleeding for the last few years.  Denies dysphagia odynophagia frequent heartburn nausea or vomiting.    ROS:  Review of Systems  Constitutional:  Negative for appetite change and unexpected weight change.  HENT:  Negative for mouth sores and voice change.   Eyes:  Negative for pain and redness.  Respiratory:  Negative for cough and shortness of breath.   Cardiovascular:  Negative for chest pain and palpitations.  Genitourinary:  Negative for dysuria and hematuria.  Musculoskeletal:  Negative for arthralgias and myalgias.  Skin:  Negative for pallor and rash.  Neurological:  Negative for weakness and headaches.  Hematological:  Negative for adenopathy.     Past Medical History: Past Medical History:  Diagnosis Date   Atrial flutter ([place])    a. 8.2017 s/p RFCA.   Chronic systolic CHF (congestive heart failure) ([place])    a. 09/2015 Echo: EF 40-45%, diff HK.   Essential hypertension    Hyperlipidemia    Hypertension    Overweight    Sleep apnea     "did study 2 days ago; CPAP ordered" (09/26/2015)   Type II diabetes mellitus ([place])   12/29/2021 cardiology office note:  "LOV April 2023   Seen by EP March 2023 Started on amiodarone Underwent cardioversion,  Ablation 10/06/21   Ablation reports that he has been feeling well, denies any tachypalpitations concerning for breakthrough arrhythmia Denies leg swelling, no PND orthopnea Reports blood pressure has been running high   Prior cardiac testing reviewed Echocardiogram 06/19/21 Ejection fraction has improved up to 45 to 50%, previously was 25 to 30%   Continues to work at shoe store   EKG personally reviewed by myself on todays visit Sinus bradycardia rate 54 bpm no significant ST or T wave changes"    Past Surgical History: Past Surgical History:  Procedure Laterality Date   ATRIAL FIBRILLATION ABLATION N/A 10/05/2021   Procedure: ATRIAL FIBRILLATION ABLATION;  Surgeon: Vickie Epley, MD;  Location: [place] [facility];  Service: Cardiovascular;  Laterality: N/A;   COLONOSCOPY WITH PROPOFOL N/A 08/10/2018   Procedure: COLONOSCOPY WITH PROPOFOL;  Surgeon: Virgel Manifold, MD;  Location: [facility] ENDOSCOPY;  Service: Endoscopy;  Laterality: N/A;   ELECTROPHYSIOLOGIC STUDY N/A 09/26/2015   Procedure: A-Flutter;  Surgeon: Deboraha Sprang, MD;  Location: [place] [facility];  Service: Cardiovascular;  Laterality: N/A;   TEE WITHOUT CARDIOVERSION N/A 08/13/2015   Procedure: TRANSESOPHAGEAL ECHOCARDIOGRAM (TEE) and cardioversion;  Surgeon: Minna Merritts, MD;  Location: [facility] ORS;  Service: Cardiovascular;  Laterality: N/A;     Family History: Family History  Problem Relation  Age of Onset   Heart attack Father        died in his 53's w/ cancer but dx CAD in his 68's.   Cancer Father    Other Mother 38   Heart attack Brother        died in early 34's.   Diabetes Brother    Colon cancer Neg Hx     Social History: Social History   Socioeconomic History   Marital  status: Married    Spouse name: Not on file   Number of children: Not on file   Years of education: Not on file   Highest education level: Not on file  Occupational History   Not on file  Tobacco Use   Smoking status: Never    Passive exposure: Never   Smokeless tobacco: Never  Vaping Use   Vaping Use: Never used  Substance and Sexual Activity   Alcohol use: No    Alcohol/week: 0.0 standard drinks of alcohol   Drug use: No    Comment: Used marijuana a few times in college.   Sexual activity: Not Currently  Other Topics Concern   Not on file  Social History Narrative   Lives locally with wife.  Does not routinely exercise.     Social Determinants of Health   Financial Resource Strain: Not on file  Food Insecurity: Not on file  Transportation Needs: Not on file  Physical Activity: Not on file  Stress: Not on file  Social Connections: Not on file    Allergies: No Known Allergies  Outpatient Meds: Current Outpatient Medications  Medication Sig Dispense Refill   amiodarone (PACERONE) 200 MG tablet Take 1 tablet (200 mg total) by mouth daily. 90 tablet 1   apixaban (ELIQUIS) 5 MG TABS tablet Take 1 tablet (5 mg total) by mouth 2 (two) times daily. 90 tablet 1   blood glucose meter kit and supplies KIT Use to check blood sugar once daily Dispense based on patient and insurance preference. 1 each 0   empagliflozin (JARDIANCE) 10 MG TABS tablet Take 1 tablet (10 mg total) by mouth daily. 30 tablet 6   furosemide (LASIX) 40 MG tablet Take 1 tablet (40 mg total) by mouth daily. (Patient taking differently: Take 40 mg by mouth daily as needed for fluid or edema.) 30 tablet 3   losartan (COZAAR) 50 MG tablet Take 1 tablet (50 mg total) by mouth daily. 90 tablet 3   metFORMIN (GLUCOPHAGE) 500 MG tablet Take 2 tablets by mouth twice daily (Patient taking differently: Take 1,000 mg by mouth 2 (two) times daily with a meal.) 360 tablet 1   metoprolol succinate (TOPROL-XL) 100 MG 24 hr  tablet Take 1 tablet (100 mg total) by mouth 2 (two) times daily. 90 tablet 3   metoprolol tartrate (LOPRESSOR) 25 MG tablet Take 1 tablet (25 mg total) by mouth daily as needed (Take for HR >100). 30 tablet 3   potassium chloride SA (KLOR-CON M) 20 MEQ tablet Take 1 tablet (20 mEq total) by mouth daily. 90 tablet 3   rosuvastatin (CRESTOR) 10 MG tablet Take 1 tablet (10 mg total) by mouth every other day. 90 tablet 0   tamsulosin (FLOMAX) 0.4 MG CAPS capsule Take 1 capsule (0.4 mg total) by mouth daily. 30 capsule 11   No current facility-administered medications for this visit.      ___________________________________________________________________ Objective   Exam:  BP (!) 160/60 Comment: 180/90 second attempt  Pulse 86   Ht 6' (  1.829 m)   Wt 205 lb 2 oz (93 kg)   BMI 27.82 kg/m  Wt Readings from Last 3 Encounters:  03/08/22 205 lb 2 oz (93 kg)  12/29/21 210 lb 8 oz (95.5 kg)  12/22/21 213 lb 12.8 oz (97 kg)   No blood pressure above.  He was advised to contact cardiology for closer follow-up of blood pressure.  I also recommended he get a automated home blood pressure machine and check it twice a day so he can keep a log and communicate closely with cardiology. General: Very pleasant and well-appearing. Eyes: sclera anicteric, no redness ENT: oral mucosa moist without lesions, no cervical or supraclavicular lymphadenopathy CV: Regular without appreciable murmur, no JVD, no peripheral edema Resp: clear to auscultation bilaterally, normal RR and effort noted GI: soft, no tenderness, with active bowel sounds. No guarding or palpable organomegaly noted. Skin; warm and dry, no rash or jaundice noted Neuro: awake, alert and oriented x 3. Normal gross motor function and fluent speech  Data:  Most recent 2D echocardiogram  1. Left ventricular ejection fraction, by estimation, is 45 to 50%. The  left ventricle has mildly decreased function. The left ventricle has no  regional  wall motion abnormalities. Left ventricular diastolic parameters  are consistent with Grade II  diastolic dysfunction (pseudonormalization).   2. Right ventricular systolic function is normal. The right ventricular  size is normal. Tricuspid regurgitation signal is inadequate for assessing  PA pressure.   3. The mitral valve is normal in structure. No evidence of mitral valve  regurgitation. No evidence of mitral stenosis.   4. The aortic valve was not well visualized. Aortic valve regurgitation  is mild. No aortic stenosis is present.   5. The inferior vena cava is normal in size with greater than 50%  respiratory variability, suggesting right atrial pressure of 3 mmHg.   Assessment: Encounter Diagnoses  Name Primary?   Special screening for malignant neoplasms, colon Yes   Long term (current) use of antithrombotics/antiplatelets    Chronic systolic heart failure (HCC)    Paroxysmal atrial fibrillation (HCC)    Sleep apnea, unspecified type     Average risk for colorectal cancer, no prior screening.  Incidental occasional painless rectal bleeding, sounds likely hemorrhoidal.  No chronic abdominal pain or altered bowel habits, no chronic upper digestive symptoms.  Doing well in sinus rhythm status post ablation, still on [place].  I recommended he undergo screening colonoscopy and he was agreeable after discussion of procedure and risks.  The benefits and risks of the planned procedure were described in detail with the patient or (when appropriate) their health care proxy.  Risks were outlined as including, but not limited to, bleeding, infection, perforation, adverse medication reaction leading to cardiac or pulmonary decompensation, pancreatitis (if ERCP).  The limitation of incomplete mucosal visualization was also discussed.  No guarantees or warranties were given.  Patient at increased risk for cardiopulmonary complications of procedure due to medical comorbidities.  His procedure  can be done in our outpatient endoscopy lab.  Recommend brief Eliquis hold with last dose 2 evenings prior to procedure, resume the medicine either procedure day or the following depending on any polypectomies.  Low risk CVA in that scenario, especially since he is in sinus rhythm at present.  Will clear with his cardiologist to be sure. Note above, also recommended he communicate with them about his blood pressure.  The hypertension is not causing him any symptoms today.   Nelida Meuse III  CC: Referring provider noted above

## 2022-03-11 NOTE — Telephone Encounter (Signed)
Left a message to return call.  

## 2022-03-25 ENCOUNTER — Encounter (HOSPITAL_COMMUNITY): Payer: Self-pay | Admitting: *Deleted

## 2022-04-01 ENCOUNTER — Telehealth: Payer: Self-pay | Admitting: Family

## 2022-04-01 DIAGNOSIS — I480 Paroxysmal atrial fibrillation: Secondary | ICD-10-CM

## 2022-04-01 NOTE — Telephone Encounter (Signed)
Pt need a refill on ELIQUIS sent to [facility]

## 2022-04-02 MED ORDER — APIXABAN 5 MG PO TABS: 5.0000 mg | ORAL_TABLET | Freq: Two times a day (BID) | ORAL | 3 refills | Status: DC

## 2022-04-02 NOTE — Telephone Encounter (Signed)
Call pt He is due to DM follow up , please sch  I refilled eliquis 34m BID  and reviewed dr gDonivan Scullnote from 12/2021

## 2022-04-02 NOTE — Addendum Note (Signed)
Addended by: [name] on: 04/02/2022 09:46 AM   Modules accepted: Orders

## 2022-04-12 ENCOUNTER — Telehealth: Payer: Self-pay | Admitting: Gastroenterology

## 2022-04-12 ENCOUNTER — Other Ambulatory Visit: Payer: Self-pay

## 2022-04-12 MED ORDER — NA SULFATE-K SULFATE-MG SULF 17.5-3.13-1.6 GM/177ML PO SOLN: 1.0000 | Freq: Once | ORAL | 0 refills | Status: AC

## 2022-04-12 NOTE — Telephone Encounter (Signed)
Spoke to the patient and he is aware we sent Suprep to the pharmacy.

## 2022-04-12 NOTE — Telephone Encounter (Signed)
Inbound call from pt, he is schedule for a  Colon 2/28/ and need his prep kit to be sent to pharmacy .please call pt once is done.Thanks

## 2022-04-14 ENCOUNTER — Other Ambulatory Visit: Payer: Self-pay

## 2022-04-14 ENCOUNTER — Emergency Department (HOSPITAL_COMMUNITY): Payer: Medicare Other | Admitting: Anesthesiology

## 2022-04-14 ENCOUNTER — Inpatient Hospital Stay (HOSPITAL_COMMUNITY)
Admission: EM | Admit: 2022-04-14 | Discharge: 2022-04-16 | DRG: 907 | Disposition: A | Discharge status: Home / Self Care | Payer: Medicare Other | Source: Ambulatory Visit | Attending: Internal Medicine | Admitting: Internal Medicine

## 2022-04-14 ENCOUNTER — Ambulatory Visit (AMBULATORY_SURGERY_CENTER): Payer: Medicare Other | Admitting: Gastroenterology

## 2022-04-14 ENCOUNTER — Emergency Department (HOSPITAL_COMMUNITY): Payer: Medicare Other

## 2022-04-14 ENCOUNTER — Encounter: Payer: Self-pay | Admitting: Gastroenterology

## 2022-04-14 ENCOUNTER — Encounter (HOSPITAL_COMMUNITY)
Admission: EM | Disposition: A | Discharge status: Home / Self Care | Payer: Self-pay | Source: Ambulatory Visit | Attending: Critical Care Medicine

## 2022-04-14 ENCOUNTER — Encounter (HOSPITAL_COMMUNITY): Payer: Self-pay

## 2022-04-14 VITALS — BP 155/73 | HR 58 | Temp 98.0°F | Resp 17 | Ht 72.0 in | Wt 205.0 lb

## 2022-04-14 DIAGNOSIS — I110 Hypertensive heart disease with heart failure: Secondary | ICD-10-CM | POA: Diagnosis present

## 2022-04-14 DIAGNOSIS — T185XXA Foreign body in anus and rectum, initial encounter: Secondary | ICD-10-CM

## 2022-04-14 DIAGNOSIS — I5022 Chronic systolic (congestive) heart failure: Secondary | ICD-10-CM | POA: Diagnosis present

## 2022-04-14 DIAGNOSIS — E119 Type 2 diabetes mellitus without complications: Secondary | ICD-10-CM | POA: Diagnosis present

## 2022-04-14 DIAGNOSIS — G4730 Sleep apnea, unspecified: Secondary | ICD-10-CM

## 2022-04-14 DIAGNOSIS — I4891 Unspecified atrial fibrillation: Secondary | ICD-10-CM

## 2022-04-14 DIAGNOSIS — E663 Overweight: Secondary | ICD-10-CM | POA: Diagnosis present

## 2022-04-14 DIAGNOSIS — I10 Essential (primary) hypertension: Secondary | ICD-10-CM

## 2022-04-14 DIAGNOSIS — I509 Heart failure, unspecified: Secondary | ICD-10-CM | POA: Diagnosis not present

## 2022-04-14 DIAGNOSIS — N400 Enlarged prostate without lower urinary tract symptoms: Secondary | ICD-10-CM | POA: Diagnosis present

## 2022-04-14 DIAGNOSIS — K5140 Inflammatory polyps of colon without complications: Secondary | ICD-10-CM

## 2022-04-14 DIAGNOSIS — Z833 Family history of diabetes mellitus: Secondary | ICD-10-CM

## 2022-04-14 DIAGNOSIS — D62 Acute posthemorrhagic anemia: Secondary | ICD-10-CM | POA: Diagnosis present

## 2022-04-14 DIAGNOSIS — Z8249 Family history of ischemic heart disease and other diseases of the circulatory system: Secondary | ICD-10-CM | POA: Diagnosis not present

## 2022-04-14 DIAGNOSIS — K922 Gastrointestinal hemorrhage, unspecified: Secondary | ICD-10-CM | POA: Diagnosis present

## 2022-04-14 DIAGNOSIS — R578 Other shock: Secondary | ICD-10-CM | POA: Diagnosis present

## 2022-04-14 DIAGNOSIS — D123 Benign neoplasm of transverse colon: Secondary | ICD-10-CM

## 2022-04-14 DIAGNOSIS — K625 Hemorrhage of anus and rectum: Secondary | ICD-10-CM | POA: Diagnosis not present

## 2022-04-14 DIAGNOSIS — K635 Polyp of colon: Secondary | ICD-10-CM

## 2022-04-14 DIAGNOSIS — K921 Melena: Secondary | ICD-10-CM

## 2022-04-14 DIAGNOSIS — Z634 Disappearance and death of family member: Secondary | ICD-10-CM

## 2022-04-14 DIAGNOSIS — E785 Hyperlipidemia, unspecified: Secondary | ICD-10-CM | POA: Diagnosis present

## 2022-04-14 DIAGNOSIS — Z7901 Long term (current) use of anticoagulants: Secondary | ICD-10-CM | POA: Diagnosis not present

## 2022-04-14 DIAGNOSIS — Y838 Other surgical procedures as the cause of abnormal reaction of the patient, or of later complication, without mention of misadventure at the time of the procedure: Secondary | ICD-10-CM | POA: Diagnosis present

## 2022-04-14 DIAGNOSIS — K626 Ulcer of anus and rectum: Secondary | ICD-10-CM | POA: Diagnosis present

## 2022-04-14 DIAGNOSIS — E861 Hypovolemia: Secondary | ICD-10-CM | POA: Diagnosis present

## 2022-04-14 DIAGNOSIS — Z6827 Body mass index (BMI) 27.0-27.9, adult: Secondary | ICD-10-CM

## 2022-04-14 DIAGNOSIS — Z7984 Long term (current) use of oral hypoglycemic drugs: Secondary | ICD-10-CM

## 2022-04-14 DIAGNOSIS — I480 Paroxysmal atrial fibrillation: Secondary | ICD-10-CM | POA: Diagnosis not present

## 2022-04-14 DIAGNOSIS — N179 Acute kidney failure, unspecified: Secondary | ICD-10-CM | POA: Diagnosis present

## 2022-04-14 DIAGNOSIS — D122 Benign neoplasm of ascending colon: Secondary | ICD-10-CM

## 2022-04-14 DIAGNOSIS — G4733 Obstructive sleep apnea (adult) (pediatric): Secondary | ICD-10-CM | POA: Diagnosis present

## 2022-04-14 DIAGNOSIS — Z79899 Other long term (current) drug therapy: Secondary | ICD-10-CM

## 2022-04-14 DIAGNOSIS — D124 Benign neoplasm of descending colon: Secondary | ICD-10-CM

## 2022-04-14 DIAGNOSIS — Z1211 Encounter for screening for malignant neoplasm of colon: Secondary | ICD-10-CM

## 2022-04-14 DIAGNOSIS — K621 Rectal polyp: Secondary | ICD-10-CM | POA: Diagnosis present

## 2022-04-14 DIAGNOSIS — K91840 Postprocedural hemorrhage and hematoma of a digestive system organ or structure following a digestive system procedure: Principal | ICD-10-CM | POA: Diagnosis present

## 2022-04-14 DIAGNOSIS — I4892 Unspecified atrial flutter: Secondary | ICD-10-CM | POA: Diagnosis present

## 2022-04-14 DIAGNOSIS — D375 Neoplasm of uncertain behavior of rectum: Secondary | ICD-10-CM

## 2022-04-14 HISTORY — PX: IR US GUIDE VASC ACCESS RIGHT: IMG2390

## 2022-04-14 HISTORY — PX: IR ANGIOGRAM SELECTIVE EACH ADDITIONAL VESSEL: IMG667

## 2022-04-14 HISTORY — PX: FLEXIBLE SIGMOIDOSCOPY: SHX5431

## 2022-04-14 HISTORY — PX: IR EMBO ART  VEN HEMORR LYMPH EXTRAV  INC GUIDE ROADMAPPING: IMG5450

## 2022-04-14 HISTORY — PX: HEMOSTASIS CONTROL: SHX6838

## 2022-04-14 HISTORY — PX: IR ANGIOGRAM VISCERAL SELECTIVE: IMG657

## 2022-04-14 HISTORY — PX: IR FLUORO GUIDE CV LINE RIGHT: IMG2283

## 2022-04-14 LAB — MRSA NEXT GEN BY PCR, NASAL: MRSA by PCR Next Gen: NOT DETECTED

## 2022-04-14 LAB — BPAM RBC
Blood Product Expiration Date: 202403282359
Blood Product Expiration Date: 202403282359
Blood Product Expiration Date: 202403282359
Blood Product Expiration Date: 202403282359
ISSUE DATE / TIME: 202402281557
ISSUE DATE / TIME: 202402281557
ISSUE DATE / TIME: 202402281557
ISSUE DATE / TIME: 202402281557
Unit Type and Rh: 5100
Unit Type and Rh: 5100
Unit Type and Rh: 5100
Unit Type and Rh: 5100

## 2022-04-14 LAB — CBC WITH DIFFERENTIAL/PLATELET
Abs Immature Granulocytes: 0.05 10*3/uL (ref 0.00–0.07)
Basophils Absolute: 0.1 10*3/uL (ref 0.0–0.1)
Basophils Relative: 1 %
Eosinophils Absolute: 0.1 10*3/uL (ref 0.0–0.5)
Eosinophils Relative: 1 %
HCT: 44.4 % (ref 39.0–52.0)
Hemoglobin: 15.7 g/dL (ref 13.0–17.0)
Immature Granulocytes: 1 %
Lymphocytes Relative: 16 %
Lymphs Abs: 1.7 10*3/uL (ref 0.7–4.0)
MCH: 33.7 pg (ref 26.0–34.0)
MCHC: 35.4 g/dL (ref 30.0–36.0)
MCV: 95.3 fL (ref 80.0–100.0)
Monocytes Absolute: 0.8 10*3/uL (ref 0.1–1.0)
Monocytes Relative: 8 %
Neutro Abs: 8.1 10*3/uL — ABNORMAL HIGH (ref 1.7–7.7)
Neutrophils Relative %: 73 %
Platelets: 255 10*3/uL (ref 150–400)
RBC: 4.66 MIL/uL (ref 4.22–5.81)
RDW: 12.3 % (ref 11.5–15.5)
WBC: 10.7 10*3/uL — ABNORMAL HIGH (ref 4.0–10.5)
nRBC: 0 % (ref 0.0–0.2)

## 2022-04-14 LAB — TYPE AND SCREEN
ABO/RH(D): O POS
Antibody Screen: NEGATIVE
Unit division: 0
Unit division: 0
Unit division: 0
Unit division: 0

## 2022-04-14 LAB — POCT I-STAT 7, (LYTES, BLD GAS, ICA,H+H)
Acid-base deficit: 3 mmol/L — ABNORMAL HIGH (ref 0.0–2.0)
Acid-base deficit: 4 mmol/L — ABNORMAL HIGH (ref 0.0–2.0)
Bicarbonate: 23.1 mmol/L (ref 20.0–28.0)
Bicarbonate: 21.7 mmol/L (ref 20.0–28.0)
Calcium, Ion: 1.17 mmol/L (ref 1.15–1.40)
Calcium, Ion: 1.15 mmol/L (ref 1.15–1.40)
HCT: 32 % — ABNORMAL LOW (ref 39.0–52.0)
HCT: 28 % — ABNORMAL LOW (ref 39.0–52.0)
Hemoglobin: 10.9 g/dL — ABNORMAL LOW (ref 13.0–17.0)
Hemoglobin: 9.5 g/dL — ABNORMAL LOW (ref 13.0–17.0)
O2 Saturation: 100 %
O2 Saturation: 100 %
Patient temperature: 36.2
Potassium: 3.9 mmol/L (ref 3.5–5.1)
Potassium: 3.9 mmol/L (ref 3.5–5.1)
Sodium: 138 mmol/L (ref 135–145)
Sodium: 138 mmol/L (ref 135–145)
TCO2: 25 mmol/L (ref 22–32)
TCO2: 23 mmol/L (ref 22–32)
pCO2 arterial: 46 mmHg (ref 32–48)
pCO2 arterial: 43.6 mmHg (ref 32–48)
pH, Arterial: 7.306 — ABNORMAL LOW (ref 7.35–7.45)
pH, Arterial: 7.305 — ABNORMAL LOW (ref 7.35–7.45)
pO2, Arterial: 192 mmHg — ABNORMAL HIGH (ref 83–108)
pO2, Arterial: 193 mmHg — ABNORMAL HIGH (ref 83–108)

## 2022-04-14 LAB — BASIC METABOLIC PANEL
Anion gap: 10 (ref 5–15)
BUN: 11 mg/dL (ref 8–23)
CO2: 18 mmol/L — ABNORMAL LOW (ref 22–32)
Calcium: 8.3 mg/dL — ABNORMAL LOW (ref 8.9–10.3)
Chloride: 107 mmol/L (ref 98–111)
Creatinine, Ser: 1.09 mg/dL (ref 0.61–1.24)
GFR, Estimated: >60 mL/min (ref >60)
Glucose, Bld: 145 mg/dL — ABNORMAL HIGH (ref 70–99)
Potassium: 3.7 mmol/L (ref 3.5–5.1)
Sodium: 135 mmol/L (ref 135–145)

## 2022-04-14 LAB — HEMOGLOBIN AND HEMATOCRIT, BLOOD
HCT: 34.2 % — ABNORMAL LOW (ref 39.0–52.0)
Hemoglobin: 12 g/dL — ABNORMAL LOW (ref 13.0–17.0)

## 2022-04-14 LAB — POCT I-STAT, CHEM 8
BUN: 11 mg/dL (ref 8–23)
Calcium, Ion: 1.14 mmol/L — ABNORMAL LOW (ref 1.15–1.40)
Chloride: 104 mmol/L (ref 98–111)
Creatinine, Ser: 1 mg/dL (ref 0.61–1.24)
Glucose, Bld: 167 mg/dL — ABNORMAL HIGH (ref 70–99)
HCT: 35 % — ABNORMAL LOW (ref 39.0–52.0)
Hemoglobin: 11.9 g/dL — ABNORMAL LOW (ref 13.0–17.0)
Potassium: 3.2 mmol/L — ABNORMAL LOW (ref 3.5–5.1)
Sodium: 140 mmol/L (ref 135–145)
TCO2: 24 mmol/L (ref 22–32)

## 2022-04-14 LAB — I-STAT CHEM 8, ED
BUN: 12 mg/dL (ref 8–23)
Calcium, Ion: 0.97 mmol/L — ABNORMAL LOW (ref 1.15–1.40)
Chloride: 107 mmol/L (ref 98–111)
Creatinine, Ser: 0.9 mg/dL (ref 0.61–1.24)
Glucose, Bld: 139 mg/dL — ABNORMAL HIGH (ref 70–99)
HCT: 44 % (ref 39.0–52.0)
Hemoglobin: 15 g/dL (ref 13.0–17.0)
Potassium: 3.7 mmol/L (ref 3.5–5.1)
Sodium: 137 mmol/L (ref 135–145)
TCO2: 19 mmol/L — ABNORMAL LOW (ref 22–32)

## 2022-04-14 LAB — GLUCOSE, CAPILLARY
Glucose-Capillary: 141 mg/dL — ABNORMAL HIGH (ref 70–99)
Glucose-Capillary: 156 mg/dL — ABNORMAL HIGH (ref 70–99)
Glucose-Capillary: 163 mg/dL — ABNORMAL HIGH (ref 70–99)
Glucose-Capillary: 168 mg/dL — ABNORMAL HIGH (ref 70–99)

## 2022-04-14 LAB — PREPARE RBC (CROSSMATCH)

## 2022-04-14 LAB — PROTIME-INR
INR: 1 (ref 0.8–1.2)
Prothrombin Time: 13.4 seconds (ref 11.4–15.2)

## 2022-04-14 LAB — ABO/RH: ABO/RH(D): O POS

## 2022-04-14 SURGERY — SIGMOIDOSCOPY, FLEXIBLE
Anesthesia: Monitor Anesthesia Care

## 2022-04-14 MED ORDER — EPHEDRINE SULFATE-NACL 50-0.9 MG/10ML-% IV SOSY
PREFILLED_SYRINGE | INTRAVENOUS | Status: DC | PRN
  Administered 2022-04-14 (×2): 10 mg via INTRAVENOUS

## 2022-04-14 MED ORDER — AMIODARONE HCL 200 MG PO TABS
200.0000 mg | ORAL_TABLET | Freq: Every day | ORAL | Status: DC
  Administered 2022-04-15 – 2022-04-16 (×2): 200 mg via ORAL
  Filled 2022-04-14 (×2): qty 1

## 2022-04-14 MED ORDER — LIDOCAINE 2% (20 MG/ML) 5 ML SYRINGE
INTRAMUSCULAR | Status: DC | PRN
  Administered 2022-04-14 (×2): 60 mg via INTRAVENOUS

## 2022-04-14 MED ORDER — PROPOFOL 500 MG/50ML IV EMUL
INTRAVENOUS | Status: DC | PRN
  Administered 2022-04-14: 125 ug/kg/min via INTRAVENOUS

## 2022-04-14 MED ORDER — ROSUVASTATIN CALCIUM 5 MG PO TABS
10.0000 mg | ORAL_TABLET | Freq: Every day | ORAL | Status: DC
  Administered 2022-04-15 – 2022-04-16 (×2): 10 mg via ORAL
  Filled 2022-04-14 (×2): qty 2

## 2022-04-14 MED ORDER — ORAL CARE MOUTH RINSE: 15.0000 mL | OROMUCOSAL | Status: DC | PRN

## 2022-04-14 MED ORDER — LACTATED RINGERS IV SOLN: INTRAVENOUS | Status: DC | PRN

## 2022-04-14 MED ORDER — SUCCINYLCHOLINE CHLORIDE 200 MG/10ML IV SOSY
PREFILLED_SYRINGE | INTRAVENOUS | Status: DC | PRN
  Administered 2022-04-14: 100 mg via INTRAVENOUS

## 2022-04-14 MED ORDER — PHENYLEPHRINE HCL-NACL 20-0.9 MG/250ML-% IV SOLN
INTRAVENOUS | Status: DC | PRN
  Administered 2022-04-14 (×2): 50 ug/min via INTRAVENOUS

## 2022-04-14 MED ORDER — METOPROLOL SUCCINATE ER 25 MG PO TB24
100.0000 mg | ORAL_TABLET | Freq: Every day | ORAL | Status: DC
  Administered 2022-04-15: 100 mg via ORAL
  Filled 2022-04-14 (×2): qty 4

## 2022-04-14 MED ORDER — SUGAMMADEX SODIUM 200 MG/2ML IV SOLN
INTRAVENOUS | Status: DC | PRN
  Administered 2022-04-14: 200 mg via INTRAVENOUS

## 2022-04-14 MED ORDER — ALBUMIN HUMAN 5 % IV SOLN: INTRAVENOUS | Status: DC | PRN

## 2022-04-14 MED ORDER — EPINEPHRINE 1 MG/10ML IJ SOSY
PREFILLED_SYRINGE | INTRAMUSCULAR | Status: AC
  Filled 2022-04-14: qty 10

## 2022-04-14 MED ORDER — LACTATED RINGERS IV SOLN: INTRAVENOUS | Status: DC

## 2022-04-14 MED ORDER — ACETAMINOPHEN 325 MG PO TABS
650.0000 mg | ORAL_TABLET | ORAL | Status: DC | PRN
  Administered 2022-04-14 – 2022-04-15 (×2): 650 mg via ORAL
  Filled 2022-04-14 (×2): qty 2

## 2022-04-14 MED ORDER — ONDANSETRON HCL 4 MG/2ML IJ SOLN
INTRAMUSCULAR | Status: DC | PRN
  Administered 2022-04-14: 4 mg via INTRAVENOUS

## 2022-04-14 MED ORDER — METOPROLOL SUCCINATE ER 50 MG PO TB24: 100.0000 mg | ORAL_TABLET | Freq: Every day | ORAL | Status: DC

## 2022-04-14 MED ORDER — DEXAMETHASONE SODIUM PHOSPHATE 10 MG/ML IJ SOLN
INTRAMUSCULAR | Status: DC | PRN
  Administered 2022-04-14: 5 mg via INTRAVENOUS

## 2022-04-14 MED ORDER — SODIUM CHLORIDE 0.9 % IV SOLN: INTRAVENOUS | Status: DC

## 2022-04-14 MED ORDER — IOHEXOL 300 MG/ML  SOLN
150.0000 mL | Freq: Once | INTRAMUSCULAR | Status: AC | PRN
  Administered 2022-04-14: 50 mL via INTRA_ARTERIAL

## 2022-04-14 MED ORDER — PHENYLEPHRINE 80 MCG/ML (10ML) SYRINGE FOR IV PUSH (FOR BLOOD PRESSURE SUPPORT)
PREFILLED_SYRINGE | INTRAVENOUS | Status: DC | PRN
  Administered 2022-04-14: 160 ug via INTRAVENOUS
  Administered 2022-04-14 (×2): 80 ug via INTRAVENOUS

## 2022-04-14 MED ORDER — INSULIN ASPART 100 UNIT/ML IJ SOLN
0.0000 [IU] | INTRAMUSCULAR | Status: DC
  Administered 2022-04-14: 3 [IU] via SUBCUTANEOUS
  Administered 2022-04-15: 2 [IU] via SUBCUTANEOUS
  Administered 2022-04-15: 5 [IU] via SUBCUTANEOUS
  Administered 2022-04-15: 3 [IU] via SUBCUTANEOUS
  Filled 2022-04-14: qty 0.15

## 2022-04-14 MED ORDER — PROPOFOL 10 MG/ML IV BOLUS
INTRAVENOUS | Status: DC | PRN
  Administered 2022-04-14: 100 mg via INTRAVENOUS
  Administered 2022-04-14 (×2): 10 mg via INTRAVENOUS

## 2022-04-14 MED ORDER — PHENYLEPHRINE HCL (PRESSORS) 10 MG/ML IV SOLN: INTRAVENOUS | Status: DC | PRN

## 2022-04-14 MED ORDER — SODIUM CHLORIDE 0.9 % IV SOLN: 500.0000 mL | Freq: Once | INTRAVENOUS | Status: DC

## 2022-04-14 MED ORDER — ROCURONIUM BROMIDE 100 MG/10ML IV SOLN
INTRAVENOUS | Status: DC | PRN
  Administered 2022-04-14: 20 mg via INTRAVENOUS
  Administered 2022-04-14: 50 mg via INTRAVENOUS

## 2022-04-14 MED ORDER — SODIUM CHLORIDE 0.9% IV SOLUTION: Freq: Once | INTRAVENOUS | Status: DC

## 2022-04-14 NOTE — Progress Notes (Signed)
Pt did not take his Toprol Xl and Lopressor or Losartan the last 2 days. Lowest BP in Admitting was 196/88. No symptoms. Will make Dr. Loletha Carrow aware.

## 2022-04-14 NOTE — Transfer of Care (Signed)
Immediate Anesthesia Transfer of Care Note  Patient: Jeffrey Ortiz  Procedure(s) Performed: FLEXIBLE SIGMOIDOSCOPY HEMOSTASIS CONTROL  Patient Location: ICU  Anesthesia Type:General  Level of Consciousness: awake, alert , and oriented  Airway & Oxygen Therapy: Patient Spontanous Breathing  Post-op Assessment: Report given to RN and Post -op Vital signs reviewed and stable  Post vital signs: Reviewed and stable  Last Vitals: See ICU vitals Vitals Value Taken Time  BP    Temp    Pulse 67 04/14/22 1729  Resp 24 04/14/22 1729  SpO2 94 % 04/14/22 1729  Vitals shown include unvalidated device data.  Last Pain:  Vitals:   04/14/22 1509  TempSrc: Temporal  PainSc: 0-No pain         Complications: No notable events documented.

## 2022-04-14 NOTE — Anesthesia Procedure Notes (Addendum)
Arterial Line Insertion Start/End2/28/2024 3:54 PM, 04/14/2022 3:54 PM Performed by: Pervis Hocking, DO, anesthesiologist  Patient location: Pre-op. Preanesthetic checklist: patient identified, IV checked, site marked, risks and benefits discussed, surgical consent, monitors and equipment checked, pre-op evaluation, timeout performed and anesthesia consent Lidocaine 1% used for infiltration Left, radial was placed Catheter size: 20 G Hand hygiene performed  and maximum sterile barriers used   Attempts: 1 Procedure performed without using ultrasound guided technique. Following insertion, dressing applied. Post procedure assessment: normal and unchanged  Patient tolerated the procedure well with no immediate complications.

## 2022-04-14 NOTE — Progress Notes (Signed)
Called to room to assist during endoscopic procedure.  Patient ID and intended procedure confirmed with present staff. Received instructions for my participation in the procedure from the performing physician.  

## 2022-04-14 NOTE — ED Notes (Signed)
IR Dr. Earleen Newport at bedside discussing procedure

## 2022-04-14 NOTE — Anesthesia Preprocedure Evaluation (Signed)
Anesthesia Evaluation  Patient identified by MRN, date of birth, ID band Patient awake    Reviewed: Allergy & Precautions, H&P , NPO status , Patient's Chart, lab work & pertinent test results, reviewed documented beta blocker date and time   Airway Mallampati: III  TM Distance: >3 FB Neck ROM: Full    Dental  (+) Teeth Intact, Dental Advisory Given   Pulmonary sleep apnea (diagnosed 2017)    Pulmonary exam normal breath sounds clear to auscultation       Cardiovascular hypertension, Pt. on medications and Pt. on home beta blockers +CHF (LVEF 40-45%)  Normal cardiovascular exam+ dysrhythmias (eliquis LD 2 days ago) Atrial Fibrillation  Rhythm:Regular Rate:Normal     Neuro/Psych negative neurological ROS  negative psych ROS   GI/Hepatic Neg liver ROS,GERD  Controlled,,GIB s/p oupt cscope today, Hb 15 on arrival to Ed   Endo/Other  diabetes, Well Controlled, Type 2    Renal/GU negative Renal ROS  negative genitourinary   Musculoskeletal negative musculoskeletal ROS (+)    Abdominal   Peds negative pediatric ROS (+)  Hematology negative hematology ROS (+)   Anesthesia Other Findings   Reproductive/Obstetrics negative OB ROS                             Anesthesia Physical Anesthesia Plan  ASA: 4  Anesthesia Plan: MAC   Post-op Pain Management:    Induction:   PONV Risk Score and Plan: 2 and Propofol infusion and TIVA  Airway Management Planned: Natural Airway and Simple Face Mask  Additional Equipment: None  Intra-op Plan:   Post-operative Plan:   Informed Consent: I have reviewed the patients History and Physical, chart, labs and discussed the procedure including the risks, benefits and alternatives for the proposed anesthesia with the patient or authorized representative who has indicated his/her understanding and acceptance.       Plan Discussed with: CRNA  Anesthesia  Plan Comments: (Will attempt MAC but d/w pt conversion to GA depending on outcome of scope and any subsequent procedures needed. Also discussed emergency release blood products as type and screen is not yet back, but endoscopist would like to proceed emergently. Discussed postoperative intubation and ventilation as well as additional IV and arterial access as needed. All questions answered. Currently hemodynamically stable, no tachycardia or hypotension. )       Anesthesia Quick Evaluation

## 2022-04-14 NOTE — H&P (View-Only) (Signed)
Referring Provider:  Regan Lemming         Primary Care Physician:  [name], FNP Primary Gastroenterologist: Wilfrid Lund            Reason for Consultation: Hematochezia                  ASSESSMENT /  PLAN    Hematochezia Post polypectomy bleed Patient presents with hematochezia suspected to be due to a post polypectomy bleed. He underwent colonoscopy earlier today when he was found to have a large rectal polyp that was resected with hot snare.  He was noted to have significant post polypectomy bleeding, suspected to be arterial in origin, which was treated with 4 clips as well as epinephrine injection.  Will plan for a flexible sigmoidoscopy today to see if this area is continuing to bleed.  I have discussed this case with my surgery colleagues who are on board.  IR is following as well.  Depending on the results of his flexible sigmoidoscopy, will determine next best steps for hemostasis. - Plan for flexible sigmoidoscopy today.  NPO for now. - Surgery and IR following.  HPI:    Jeffrey Ortiz is a 67 y.o. male with history of A-flutter on Eliquis, HFrEF, OSA, DM p/w hematochezia.   He underwent colonoscopy earlier today when he was found to have a large rectal polyp that was resected with hot snare.  He was noted to have significant post polypectomy bleeding, suspected to be arterial in origin, which was treated with 4 clips as well as epinephrine injection.  It was felt that this endoscopic treatment was not adequate to control his bleeding.  Since his colonoscopy procedure, he has had 1 episode of hematochezia with bright Jeffrey blood.  He does feel abdominal fullness after his procedure.  Has mild abdominal pain.  Denies nausea or vomiting.  Denies lightheadedness, shortness of breath, or chest pain.  Last dose of Eliquis was 2 days ago.  He has not had anything to eat or drink after his colonoscopy procedure.  Colonoscopy 04/14/22:    Past Medical History:  Diagnosis Date    Atrial flutter ([place])    a. 8.2017 s/p RFCA.   Chronic systolic CHF (congestive heart failure) ([place])    a. 09/2015 Echo: EF 40-45%, diff HK.   Essential hypertension    Hyperlipidemia    Hypertension    Overweight    Sleep apnea    "did study 2 days ago; CPAP ordered" (09/26/2015)   Type II diabetes mellitus ([place])     Past Surgical History:  Procedure Laterality Date   ATRIAL FIBRILLATION ABLATION N/A 10/05/2021   Procedure: ATRIAL FIBRILLATION ABLATION;  Surgeon: Vickie Epley, MD;  Location: [facility];  Service: Cardiovascular;  Laterality: N/A;   COLONOSCOPY WITH PROPOFOL N/A 08/10/2018   Procedure: COLONOSCOPY WITH PROPOFOL;  Surgeon: Virgel Manifold, MD;  Location: [facility];  Service: Endoscopy;  Laterality: N/A;   ELECTROPHYSIOLOGIC STUDY N/A 09/26/2015   Procedure: A-Flutter;  Surgeon: Deboraha Sprang, MD;  Location: [facility];  Service: Cardiovascular;  Laterality: N/A;   TEE WITHOUT CARDIOVERSION N/A 08/13/2015   Procedure: TRANSESOPHAGEAL ECHOCARDIOGRAM (TEE) and cardioversion;  Surgeon: Minna Merritts, MD;  Location: [facility];  Service: Cardiovascular;  Laterality: N/A;    Prior to Admission medications   Medication Sig Start Date End Date Taking? Authorizing Provider  amiodarone (PACERONE) 200 MG tablet Take 1 tablet (200 mg total) by mouth daily. 12/22/21  Sherran Needs, NP  apixaban (ELIQUIS) 5 MG TABS tablet Take 1 tablet (5 mg total) by mouth 2 (two) times daily. 04/02/22   [name], FNP  blood glucose meter kit and supplies KIT Use to check blood sugar once daily Dispense based on patient and insurance preference. 04/24/21   [name], FNP  empagliflozin (JARDIANCE) 10 MG TABS tablet Take 1 tablet (10 mg total) by mouth daily. 01/27/22   Minna Merritts, MD  furosemide (LASIX) 40 MG tablet Take 1 tablet (40 mg total) by mouth daily. Patient taking differently: Take 40 mg by mouth daily as needed for fluid or edema.  06/04/21   Minna Merritts, MD  losartan (COZAAR) 50 MG tablet Take 1 tablet (50 mg total) by mouth daily. 12/29/21   Minna Merritts, MD  metFORMIN (GLUCOPHAGE) 500 MG tablet Take 2 tablets by mouth twice daily Patient taking differently: Take 1,000 mg by mouth 2 (two) times daily with a meal. 02/17/21   Arnett, Yvetta Coder, FNP  metoprolol succinate (TOPROL-XL) 100 MG 24 hr tablet Take 1 tablet (100 mg total) by mouth 2 (two) times daily. 10/12/21   [name], FNP  metoprolol tartrate (LOPRESSOR) 25 MG tablet Take 1 tablet (25 mg total) by mouth daily as needed (Take for HR >100). 02/09/22   Minna Merritts, MD  potassium chloride SA (KLOR-CON M) 20 MEQ tablet Take 1 tablet (20 mEq total) by mouth daily. 04/24/21   Minna Merritts, MD  rosuvastatin (CRESTOR) 10 MG tablet Take 1 tablet (10 mg total) by mouth every other day. 04/24/21   [name], FNP  tamsulosin (FLOMAX) 0.4 MG CAPS capsule Take 1 capsule (0.4 mg total) by mouth daily. 04/16/21   [name], MD    Current Facility-Administered Medications  Medication Dose Route Frequency Provider Last Rate Last Admin   0.9 %  sodium chloride infusion  500 mL Intravenous Once Doran Stabler, MD       Current Outpatient Medications  Medication Sig Dispense Refill   amiodarone (PACERONE) 200 MG tablet Take 1 tablet (200 mg total) by mouth daily. 90 tablet 1   apixaban (ELIQUIS) 5 MG TABS tablet Take 1 tablet (5 mg total) by mouth 2 (two) times daily. 180 tablet 3   blood glucose meter kit and supplies KIT Use to check blood sugar once daily Dispense based on patient and insurance preference. 1 each 0   empagliflozin (JARDIANCE) 10 MG TABS tablet Take 1 tablet (10 mg total) by mouth daily. 30 tablet 6   furosemide (LASIX) 40 MG tablet Take 1 tablet (40 mg total) by mouth daily. (Patient taking differently: Take 40 mg by mouth daily as needed for fluid or edema.) 30 tablet 3   losartan (COZAAR) 50 MG tablet Take 1 tablet  (50 mg total) by mouth daily. 90 tablet 3   metFORMIN (GLUCOPHAGE) 500 MG tablet Take 2 tablets by mouth twice daily (Patient taking differently: Take 1,000 mg by mouth 2 (two) times daily with a meal.) 360 tablet 1   metoprolol succinate (TOPROL-XL) 100 MG 24 hr tablet Take 1 tablet (100 mg total) by mouth 2 (two) times daily. 90 tablet 3   metoprolol tartrate (LOPRESSOR) 25 MG tablet Take 1 tablet (25 mg total) by mouth daily as needed (Take for HR >100). 30 tablet 3   potassium chloride SA (KLOR-CON M) 20 MEQ tablet Take 1 tablet (20 mEq total) by mouth daily. 90 tablet  3   rosuvastatin (CRESTOR) 10 MG tablet Take 1 tablet (10 mg total) by mouth every other day. 90 tablet 0   tamsulosin (FLOMAX) 0.4 MG CAPS capsule Take 1 capsule (0.4 mg total) by mouth daily. 30 capsule 11    Allergies as of 04/14/2022   (No Known Allergies)    Family History  Problem Relation Age of Onset   Heart attack Father        died in his 60's w/ cancer but dx CAD in his 16's.   Cancer Father    Other Mother 40   Heart attack Brother        died in early 13's.   Diabetes Brother    Colon cancer Neg Hx     Social History   Tobacco Use   Smoking status: Never    Passive exposure: Never   Smokeless tobacco: Never  Vaping Use   Vaping Use: Never used  Substance Use Topics   Alcohol use: No    Alcohol/week: 0.0 standard drinks of alcohol   Drug use: No    Comment: Used marijuana a few times in college.    Review of Systems: All systems reviewed and negative except where noted in HPI.  Physical Exam: Vital signs in last 24 hours: Temp:  [98 F (36.7 C)] 98 F (36.7 C) (02/28 1058) Pulse Rate:  [58-60] 60 (02/28 1403) Resp:  [8-20] 17 (02/28 1315) BP: (98-208)/(50-104) 176/104 (02/28 1403) SpO2:  [90 %-98 %] 98 % (02/28 1403) Weight:  [93 kg] 93 kg (02/28 1408)   General:   Awake, alert, NAD Psych:  Pleasant, cooperative. Normal mood and affect. Eyes:  Pupils equal, sclera clear, no  icterus.    Neck:  Supple; no masses Lungs:  No increased WOB Heart:  Regular rate Abdomen:  Soft, non-distended, mildly tender Rectal: Some blood noted around the rectum. Msk:  Symmetrical without gross deformities. . Neurologic:  Alert and  oriented x4;  grossly normal neurologically. Skin:  Intact without significant lesions or rashes.   Intake/Output from previous day: No intake/output data recorded. Intake/Output this shift: No intake/output data recorded.  Lab Results: No results for input(s): "WBC", "HGB", "HCT", "PLT" in the last 72 hours. BMET No results for input(s): "NA", "K", "CL", "CO2", "GLUCOSE", "BUN", "CREATININE", "CALCIUM" in the last 72 hours. LFT No results for input(s): "PROT", "ALBUMIN", "AST", "ALT", "ALKPHOS", "BILITOT", "BILIDIR", "IBILI" in the last 72 hours. PT/INR No results for input(s): "LABPROT", "INR" in the last 72 hours. Hepatitis Panel No results for input(s): "HEPBSAG", "HCVAB", "HEPAIGM", "HEPBIGM" in the last 72 hours.   .    Latest Ref Rng & Units 10/05/2021    6:53 AM 05/06/2021    8:41 AM 04/10/2021    7:42 AM  CBC  WBC 4.0 - 10.5 K/uL 7.3  7.4  7.6   Hemoglobin 13.0 - 17.0 g/dL 17.1  18.3  17.7   Hematocrit 39.0 - 52.0 % 49.6  53.0  52.6   Platelets 150 - 400 K/uL 219  243  220.0     .    Latest Ref Rng & Units 10/05/2021    6:53 AM 09/28/2021    1:41 PM 05/06/2021    8:41 AM  CMP  Glucose 70 - 99 mg/dL 125   155   BUN 8 - 23 mg/dL 15   20   Creatinine 0.61 - 1.24 mg/dL 1.03  1.10  0.99   Sodium 135 - 145 mmol/L 139   140  Potassium 3.5 - 5.1 mmol/L 3.8   4.3   Chloride 98 - 111 mmol/L 109   103   CO2 22 - 32 mmol/L 23   16   Calcium 8.9 - 10.3 mg/dL 8.5   9.3    Studies/Results: No results found.  Active Problems:   * No active hospital problems. Christia Reading, M.D. @  04/14/2022, 2:30 PM

## 2022-04-14 NOTE — H&P (Signed)
NAME:  Jeffrey Ortiz, MRN:  [id number], DOB:  Sep 05, 1955, LOS: 0 ADMISSION DATE:  04/14/2022, CONSULTATION DATE:  04/14/22 REFERRING MD:  Loletha Carrow -GI , CHIEF COMPLAINT:  arterial GIB   History of Present Illness:  67 yo M Afib on Eliquis (taken 2/26) who presented for outpt colonoscopy, and underwent polypectomy concomitantly.  After polypectomy, there was arterial bleeding from site.  4 clips were placed for endoscopic control. EBL about 600cc. He was then taken to [facility] ED where he had large bloody BM and was empirically given 1PRBC -- EBL about 500cc. Underwent repeat flex sig, tx with hemospray. CCS and  IR were consulted.   IR with plans for angio, possible embo + cvc placement.   PCCM is consulted for admission   Pertinent  Medical History   Afib on eliquis  Chronic systolic HF  DM2 HTN HLD   Significant Hospital Events: Including procedures, antibiotic start and stop dates in addition to other pertinent events   2/28 elective colonoscopy, polyopectomy -- > arterial bleed. Repeat flex sig, CCS IR consult. Plan for IR angio +/- embo, PCCM admission   Interim History / Subjective:  Above events   Objective   Blood pressure (!) 139/94, pulse 61, temperature 98.1 F (36.7 C), temperature source Temporal, resp. rate 19, height 6' (1.829 m), weight 93 kg, SpO2 99 %.       No intake or output data in the 24 hours ending 04/14/22 1621 Filed Weights   04/14/22 1408 04/14/22 1509  Weight: 93 kg 93 kg    Examination: General: wdwn middle aged M NAD  HENT: NCAT  Lungs: CTAb  Cardiovascular:  rrr  Abdomen: soft. Blood from rectum Extremities: No acute joint deformity Neuro: drowsy, following commands  GU: defer   Resolved Hospital Problem list     Assessment & Plan:   Gastrointestinal hemorrhage  -arterial bleed following polypectomy -s/p clipping, medical homeostatic attempts.  -s/p embo x a few arteries with IR P -maintain Type and screen -BP management PRN -repeat  H/H  -holding eliquis  -IVF given contrast load  -will watch in ICU overnight, but fortunately is doing quite well post IR   HTN HLD DM Aflutter on eliquis Chronic HFrEF  P -amio, statin, metop, metformin to start tomorrow -with contrast from above, will do SSI in lieu of home metformin for now  -add back remaining home meds as applicable -hold eliquis   Best Practice (right click and "Reselect all SmartList Selections" daily)   Diet/type: NPO w/ oral meds DVT prophylaxis: SCD GI prophylaxis: N/A Lines: Central line and Arterial Line Foley:  N/A Code Status:  full code by default  Last date of multidisciplinary goals of care discussion [--]  Labs   CBC: Recent Labs  Lab 04/14/22 1454 04/14/22 1504 04/14/22 1552  WBC 10.7*  --   --   NEUTROABS 8.1*  --   --   HGB 15.7 15.0 11.9*  HCT 44.4 44.0 35.0*  MCV 95.3  --   --   PLT 255  --   --     Basic Metabolic Panel: Recent Labs  Lab 04/14/22 1454 04/14/22 1504 04/14/22 1552  NA 135 137 140  K 3.7 3.7 3.2*  CL 107 107 104  CO2 18*  --   --   GLUCOSE 145* 139* 167*  BUN '11 12 11  '$ CREATININE 1.09 0.90 1.00  CALCIUM 8.3*  --   --    GFR: Estimated Creatinine Clearance: 80.8 mL/min (by C-G  formula based on SCr of 1 mg/dL). Recent Labs  Lab 04/14/22 1454  WBC 10.7*    Liver Function Tests: No results for input(s): "AST", "ALT", "ALKPHOS", "BILITOT", "PROT", "ALBUMIN" in the last 168 hours. No results for input(s): "LIPASE", "AMYLASE" in the last 168 hours. No results for input(s): "AMMONIA" in the last 168 hours.  ABG    Component Value Date/Time   TCO2 24 04/14/2022 1552     Coagulation Profile: Recent Labs  Lab 04/14/22 1454  INR 1.0    Cardiac Enzymes: No results for input(s): "CKTOTAL", "CKMB", "CKMBINDEX", "TROPONINI" in the last 168 hours.  HbA1C: Hemoglobin A1C  Date/Time Value Ref Range Status  10/12/2021 01:18 PM 6.6 (A) 4.0 - 5.6 % Final  10/21/2020 09:06 AM 6.4 (A) 4.0 - 5.6 %  Final   Hgb A1c MFr Bld  Date/Time Value Ref Range Status  04/10/2021 07:42 AM 7.1 (H) 4.6 - 6.5 % Final    Comment:    Glycemic Control Guidelines for People with Diabetes:Non Diabetic:  <6%Goal of Therapy: <7%Additional Action Suggested:  >8%   02/01/2020 02:18 PM 6.3 (H) <5.7 % of total Hgb Final    Comment:    For someone without known diabetes, a hemoglobin  A1c value between 5.7% and 6.4% is consistent with prediabetes and should be confirmed with a  follow-up test. . For someone with known diabetes, a value <7% indicates that their diabetes is well controlled. A1c targets should be individualized based on duration of diabetes, age, comorbid conditions, and other considerations. . This assay result is consistent with an increased risk of diabetes. . Currently, no consensus exists regarding use of hemoglobin A1c for diagnosis of diabetes for children. .     CBG: No results for input(s): "GLUCAP" in the last 168 hours.  Review of Systems:   Limited due to post anesthesia encephalopathy   Past Medical History:  He,  has a past medical history of Atrial flutter ([place]), Chronic systolic CHF (congestive heart failure) ([place]), Essential hypertension, Hyperlipidemia, Hypertension, Overweight, Sleep apnea, and Type II diabetes mellitus ([place]).   Surgical History:   Past Surgical History:  Procedure Laterality Date   ATRIAL FIBRILLATION ABLATION N/A 10/05/2021   Procedure: ATRIAL FIBRILLATION ABLATION;  Surgeon: Vickie Epley, MD;  Location: [facility];  Service: Cardiovascular;  Laterality: N/A;   COLONOSCOPY WITH PROPOFOL N/A 08/10/2018   Procedure: COLONOSCOPY WITH PROPOFOL;  Surgeon: Virgel Manifold, MD;  Location: [facility];  Service: Endoscopy;  Laterality: N/A;   ELECTROPHYSIOLOGIC STUDY N/A 09/26/2015   Procedure: A-Flutter;  Surgeon: Deboraha Sprang, MD;  Location: [facility];  Service: Cardiovascular;  Laterality: N/A;   TEE WITHOUT  CARDIOVERSION N/A 08/13/2015   Procedure: TRANSESOPHAGEAL ECHOCARDIOGRAM (TEE) and cardioversion;  Surgeon: Minna Merritts, MD;  Location: [facility];  Service: Cardiovascular;  Laterality: N/A;     Social History:   reports that he has never smoked. He has never been exposed to tobacco smoke. He has never used smokeless tobacco. He reports that he does not drink alcohol and does not use drugs.   Family History:  His family history includes Cancer in his father; Diabetes in his brother; Heart attack in his brother and father; Other (age of onset: 1) in his mother. There is no history of Colon cancer.   Allergies No Known Allergies   Home Medications  Prior to Admission medications   Medication Sig Start Date End Date Taking? Authorizing Provider  amiodarone (PACERONE) 200  MG tablet Take 1 tablet (200 mg total) by mouth daily. 12/22/21  Yes Sherran Needs, NP  apixaban (ELIQUIS) 5 MG TABS tablet Take 1 tablet (5 mg total) by mouth 2 (two) times daily. 04/02/22  Yes [name], FNP  empagliflozin (JARDIANCE) 10 MG TABS tablet Take 1 tablet (10 mg total) by mouth daily. 01/27/22  Yes Minna Merritts, MD  losartan (COZAAR) 50 MG tablet Take 1 tablet (50 mg total) by mouth daily. 12/29/21  Yes Gollan, Kathlene November, MD  metoprolol succinate (TOPROL-XL) 100 MG 24 hr tablet Take 1 tablet (100 mg total) by mouth 2 (two) times daily. 10/12/21  Yes [name], FNP  metoprolol tartrate (LOPRESSOR) 25 MG tablet Take 1 tablet (25 mg total) by mouth daily as needed (Take for HR >100). 02/09/22  Yes Gollan, Kathlene November, MD  potassium chloride SA (KLOR-CON M) 20 MEQ tablet Take 1 tablet (20 mEq total) by mouth daily. 04/24/21  Yes Gollan, Kathlene November, MD  rosuvastatin (CRESTOR) 10 MG tablet Take 1 tablet (10 mg total) by mouth every other day. 04/24/21  Yes [name], FNP  tamsulosin (FLOMAX) 0.4 MG CAPS capsule Take 1 capsule (0.4 mg total) by mouth daily. 04/16/21  Yes [name], MD   blood glucose meter kit and supplies KIT Use to check blood sugar once daily Dispense based on patient and insurance preference. 04/24/21   [name], FNP  furosemide (LASIX) 40 MG tablet Take 1 tablet (40 mg total) by mouth daily. Patient taking differently: Take 40 mg by mouth daily as needed for fluid or edema. 06/04/21   Minna Merritts, MD  metFORMIN (GLUCOPHAGE) 500 MG tablet Take 2 tablets by mouth twice daily Patient taking differently: Take 1,000 mg by mouth 2 (two) times daily with a meal. 02/17/21   [name], FNP     Critical care time: n/a     Eliseo Gum MSN, AGACNP-BC [place] for pager  04/14/2022, 5:26 PM

## 2022-04-14 NOTE — Anesthesia Procedure Notes (Addendum)
Procedure Name: Intubation Date/Time: 04/14/2022 4:06 PM  Performed by: Janene Harvey, CRNAPre-anesthesia Checklist: Patient identified, Emergency Drugs available, Suction available and Patient being monitored Patient Re-evaluated:Patient Re-evaluated prior to induction Oxygen Delivery Method: Circle system utilized Preoxygenation: Pre-oxygenation with 100% oxygen Induction Type: IV induction Ventilation: Mask ventilation without difficulty and Oral airway inserted - appropriate to patient size Laryngoscope Size: Mac and 4 Grade View: Grade I Tube type: Oral Number of attempts: 1 Airway Equipment and Method: Stylet and Oral airway Placement Confirmation: ETT inserted through vocal cords under direct vision, positive ETCO2 and breath sounds checked- equal and bilateral Secured at: 23 cm Tube secured with: Tape Dental Injury: Teeth and Oropharynx as per pre-operative assessment

## 2022-04-14 NOTE — Op Note (Addendum)
[place] Patient Name: Tyquez Toutant Procedure Date: 04/14/2022 12:11 PM MRN: [id number] Endoscopist: Mallie Mussel L. Loletha Carrow , MD, [id number] Age: 67 Referring MD:  Date of Birth: 05-03-1955 Gender: Male Account #: [id number] Procedure:                Colonoscopy Indications:              Screening for colorectal malignant neoplasm, This                            is the patient's first colonoscopy Medicines:                Monitored Anesthesia Care Procedure:                Pre-Anesthesia Assessment:                           - Prior to the procedure, a History and Physical                            was performed, and patient medications and                            allergies were reviewed. The patient's tolerance of                            previous anesthesia was also reviewed. The risks                            and benefits of the procedure and the sedation                            options and risks were discussed with the patient.                            All questions were answered, and informed consent                            was obtained. Prior Anticoagulants: The patient has                            taken Eliquis (apixaban), last dose was 2 days                            prior to procedure. ASA Grade Assessment: III - A                            patient with severe systemic disease. After                            reviewing the risks and benefits, the patient was                            deemed in satisfactory condition to undergo the  procedure.                           After obtaining informed consent, the colonoscope                            was passed under direct vision. Throughout the                            procedure, the patient's blood pressure, pulse, and                            oxygen saturations were monitored continuously. The                            Olympus CF-HQ190L ([id number]) Colonoscope was                             introduced through the anus and advanced to the the                            cecum, identified by appendiceal orifice and                            ileocecal valve. The colonoscopy was performed                            without difficulty. The patient tolerated the                            procedure. Scope In: 12:29:21 PM Scope Out: 1:13:10 PM Scope Withdrawal Time: 0 hours 39 minutes 18 seconds  Total Procedure Duration: 0 hours 43 minutes 49 seconds  Findings:                 The perianal and digital rectal examinations were                            normal.                           Four sessile polyps were found in the transverse                            colon and ascending colon. The polyps were 4 to 8                            mm in size. These polyps were removed with a cold                            snare. Resection and retrieval were complete.                           Two sessile polyps were found in the descending  colon and transverse colon. The polyps were 4 to 8                            mm in size. These polyps were removed with a cold                            snare. Resection and retrieval were complete.                           A patchy area of mildly altered vascular, congested                            and erythematous mucosa was found in the sigmoid                            colon. Biopsies were taken with a cold forceps for                            histology. (Rule out chronic colitis versus prep                            artifact)                           A few small-mouthed diverticula were found in the                            left colon.                           An 18 mm polyp was found in the mid rectum. The                            polyp was pedunculated. The polyp was removed with                            a hot snare. The polyp was removed en bloc, then                             there was immediate brisk arterial bleeding. (See                            photo) see below for hemostasis efforts that were                            not successful. There was still pulsatile bleeding                            coming from the polypectomy site at the completion                            of the procedure. Resection was complete, but the  polyp tissue was only partially retrieved (pieces                            retrieved during sectioning-majority of polyp still                            in rectum). For hemostasis, four hemostatic clips                            were successfully placed (MR conditional). Area was                            unsuccessfully injected with 15 mL of a 0.1 mg/mL                            solution of epinephrine for hemostasis.                           The exam was otherwise without abnormality. Complications:            Significant hemorrhage Estimated Blood Loss:     Estimated blood loss: Greater than 100 mL. Impression:               - Four 4 to 8 mm polyps in the transverse colon and                            in the ascending colon, removed with a cold snare.                            Resected and retrieved.                           - Two 4 to 8 mm polyps in the descending colon and                            in the transverse colon, removed with a cold snare.                            Resected and retrieved.                           - Altered vascular, congested and erythematous                            mucosa in the sigmoid colon. Biopsied.                           - Diverticulosis in the left colon.                           - One 18 mm polyp in the mid rectum, removed with a                            hot snare. Complete resection. Partial  retrieval.                            Clips (MR conditional) were placed. Treatment not                            successful.                           - The  examination was otherwise normal. Recommendation:           - Patient was transferred awake, conversational and                            hemodynamically stable by ambulance to [facility] emergency department for resuscitation,                            close monitoring and urgent consultations with                            interventional radiology and general surgery.                           Case was discussed at length with his wife Susie.                           Case was discussed with Dr. Earleen Newport of                            interventional radiology, Dr. Vanita Panda in the [facility] ED, and Dr. Dema Severin of general surgery. Damone Fancher L. Loletha Carrow, MD 04/14/2022 2:04:46 PM This report has been signed electronically.

## 2022-04-14 NOTE — Progress Notes (Signed)
History and Physical:  This patient presents for endoscopic testing for: Encounter Diagnosis  Name Primary?   Special screening for malignant neoplasms, colon Yes    Clinical details in 03/08/22 office consult note  Patient hypertensive today (asymptomatic) and did not take his BP meds last 2 days. (Misunderstood instructions) Case discussed with anesthesia and agree to proceed.  Will give antihypertensive if BP does not come down with propofol.  Patient is otherwise without complaints or active issues today.   Past Medical History: Past Medical History:  Diagnosis Date   Atrial flutter ([place])    a. 8.2017 s/p RFCA.   Chronic systolic CHF (congestive heart failure) ([place])    a. 09/2015 Echo: EF 40-45%, diff HK.   Essential hypertension    Hyperlipidemia    Hypertension    Overweight    Sleep apnea    "did study 2 days ago; CPAP ordered" (09/26/2015)   Type II diabetes mellitus ([place])      Past Surgical History: Past Surgical History:  Procedure Laterality Date   ATRIAL FIBRILLATION ABLATION N/A 10/05/2021   Procedure: ATRIAL FIBRILLATION ABLATION;  Surgeon: Vickie Epley, MD;  Location: [place] [facility];  Service: Cardiovascular;  Laterality: N/A;   COLONOSCOPY WITH PROPOFOL N/A 08/10/2018   Procedure: COLONOSCOPY WITH PROPOFOL;  Surgeon: Virgel Manifold, MD;  Location: [facility] ENDOSCOPY;  Service: Endoscopy;  Laterality: N/A;   ELECTROPHYSIOLOGIC STUDY N/A 09/26/2015   Procedure: A-Flutter;  Surgeon: Deboraha Sprang, MD;  Location: [place] [facility];  Service: Cardiovascular;  Laterality: N/A;   TEE WITHOUT CARDIOVERSION N/A 08/13/2015   Procedure: TRANSESOPHAGEAL ECHOCARDIOGRAM (TEE) and cardioversion;  Surgeon: Minna Merritts, MD;  Location: [facility] ORS;  Service: Cardiovascular;  Laterality: N/A;    Allergies: No Known Allergies  Outpatient Meds: Current Outpatient Medications  Medication Sig Dispense Refill   amiodarone (PACERONE) 200 MG tablet Take 1 tablet  (200 mg total) by mouth daily. 90 tablet 1   apixaban (ELIQUIS) 5 MG TABS tablet Take 1 tablet (5 mg total) by mouth 2 (two) times daily. 180 tablet 3   empagliflozin (JARDIANCE) 10 MG TABS tablet Take 1 tablet (10 mg total) by mouth daily. 30 tablet 6   losartan (COZAAR) 50 MG tablet Take 1 tablet (50 mg total) by mouth daily. 90 tablet 3   metFORMIN (GLUCOPHAGE) 500 MG tablet Take 2 tablets by mouth twice daily (Patient taking differently: Take 1,000 mg by mouth 2 (two) times daily with a meal.) 360 tablet 1   metoprolol succinate (TOPROL-XL) 100 MG 24 hr tablet Take 1 tablet (100 mg total) by mouth 2 (two) times daily. 90 tablet 3   metoprolol tartrate (LOPRESSOR) 25 MG tablet Take 1 tablet (25 mg total) by mouth daily as needed (Take for HR >100). 30 tablet 3   tamsulosin (FLOMAX) 0.4 MG CAPS capsule Take 1 capsule (0.4 mg total) by mouth daily. 30 capsule 11   blood glucose meter kit and supplies KIT Use to check blood sugar once daily Dispense based on patient and insurance preference. 1 each 0   furosemide (LASIX) 40 MG tablet Take 1 tablet (40 mg total) by mouth daily. (Patient taking differently: Take 40 mg by mouth daily as needed for fluid or edema.) 30 tablet 3   potassium chloride SA (KLOR-CON M) 20 MEQ tablet Take 1 tablet (20 mEq total) by mouth daily. 90 tablet 3   rosuvastatin (CRESTOR) 10 MG tablet Take 1 tablet (10 mg total) by mouth every other day.  90 tablet 0   Current Facility-Administered Medications  Medication Dose Route Frequency Provider Last Rate Last Admin   0.9 %  sodium chloride infusion  500 mL Intravenous Once Danis, Estill Cotta III, MD          ___________________________________________________________________ Objective   Exam:  BP (!) 196/88   Pulse (!) 58   Temp 98 F (36.7 C)   Ht 6' (1.829 m)   Wt 205 lb (93 kg)   SpO2 97%   BMI 27.80 kg/m   CV: regular , S1/S2 Resp: clear to auscultation bilaterally, normal RR and effort noted GI: soft, no  tenderness, with active bowel sounds.   Assessment: Encounter Diagnosis  Name Primary?   Special screening for malignant neoplasms, colon Yes     Plan: Colonoscopy  The benefits and risks of the planned procedure were described in detail with the patient or (when appropriate) their health care proxy.  Risks were outlined as including, but not limited to, bleeding, infection, perforation, adverse medication reaction leading to cardiac or pulmonary decompensation, pancreatitis (if ERCP).  The limitation of incomplete mucosal visualization was also discussed.  No guarantees or warranties were given.    The patient is appropriate for an endoscopic procedure in the ambulatory setting.   - Wilfrid Lund, MD

## 2022-04-14 NOTE — Procedures (Signed)
Interventional Radiology Procedure Note  Procedure:   US guided right CFA access.  Pelvic angiogram Embolization of superior rectal arteries supplying post-polypectomy hemorrhage.  Image guided placement of right CFV CVC.   CELT for closure  Complications: None EBL: 0cc  Recommendations:  - Ok use CVC - right hip straight x 1 hr - Stable to PACU - ICU admission over night.   - agree with serial H&H - expect there to be ongoing hematochezia as blood clears from the colon - VIR to follow - Routine wound care  Signed,  Dulcy Fanny. Earleen Newport, DO

## 2022-04-14 NOTE — Progress Notes (Signed)
Near end of procedure, polyp removal site began bleeding profusely. See colonoscopy procedure note for full details. Patient lost approx 1028m blood from polypectomy site. 803mnormal saline given IV, changed to a new bag. During emergency, paramedics called. Report to paramedics, patient to hospital with PIV in place. VSS prior to paramedics.

## 2022-04-14 NOTE — Consult Note (Signed)
Chief Complaint: Patient was seen in consultation today for GI bleed.  Referring Physician(s): Danis  Supervising Physician: Corrie Mckusick  Patient Status: [facility] - ED  History of Present Illness: Jeffrey Ortiz is a 67 y.o. male with a past medical history significant for OSA, DM, HTN, HLD, CHF, a.fib on Eliquis who was brought to the ED from outpatient endoscopy center after prolonged GI bleeding post polypectomy this afternoon.   Patient seen in the ED with Dr. Earleen Newport, wife Susie present for discussion. Jeffrey Ortiz denies any complaints currently - he specifically denies chest pain, abdominal pain, dyspnea, shortness of breath, headache, dizziness. He feels a little groggy from the sedation medicine still. He has never had any abdominal surgeries, MI or CVA. He has a history of a.fib that he takes Eliquis for with last dose several days ago in preparation for colonoscopy. He last had some liquid early this morning as part of the colonoscopy prep. He is a Freight forwarder at [employer] in [place] and is completely independent at baseline. His PCP is Therapist, art in [place].   Dr. Earleen Newport discussed angiogram indications, risks, benefits and alternatives with patient and his wife. They are agreeable to proceed if indicated.  Past Medical History:  Diagnosis Date   Atrial flutter ([place])    a. 8.2017 s/p RFCA.   Chronic systolic CHF (congestive heart failure) ([place])    a. 09/2015 Echo: EF 40-45%, diff HK.   Essential hypertension    Hyperlipidemia    Hypertension    Overweight    Sleep apnea    "did study 2 days ago; CPAP ordered" (09/26/2015)   Type II diabetes mellitus ([place])     Past Surgical History:  Procedure Laterality Date   ATRIAL FIBRILLATION ABLATION N/A 10/05/2021   Procedure: ATRIAL FIBRILLATION ABLATION;  Surgeon: Vickie Epley, MD;  Location: [place] [facility];  Service: Cardiovascular;  Laterality: N/A;   COLONOSCOPY WITH PROPOFOL N/A 08/10/2018   Procedure:  COLONOSCOPY WITH PROPOFOL;  Surgeon: Virgel Manifold, MD;  Location: [facility] ENDOSCOPY;  Service: Endoscopy;  Laterality: N/A;   ELECTROPHYSIOLOGIC STUDY N/A 09/26/2015   Procedure: A-Flutter;  Surgeon: Deboraha Sprang, MD;  Location: [place] [facility];  Service: Cardiovascular;  Laterality: N/A;   TEE WITHOUT CARDIOVERSION N/A 08/13/2015   Procedure: TRANSESOPHAGEAL ECHOCARDIOGRAM (TEE) and cardioversion;  Surgeon: Minna Merritts, MD;  Location: [facility] ORS;  Service: Cardiovascular;  Laterality: N/A;    Allergies: Patient has no known allergies.  Medications: Prior to Admission medications   Medication Sig Start Date End Date Taking? Authorizing Provider  amiodarone (PACERONE) 200 MG tablet Take 1 tablet (200 mg total) by mouth daily. 12/22/21  Yes Sherran Needs, NP  apixaban (ELIQUIS) 5 MG TABS tablet Take 1 tablet (5 mg total) by mouth 2 (two) times daily. 04/02/22  Yes [name], FNP  empagliflozin (JARDIANCE) 10 MG TABS tablet Take 1 tablet (10 mg total) by mouth daily. 01/27/22  Yes Minna Merritts, MD  losartan (COZAAR) 50 MG tablet Take 1 tablet (50 mg total) by mouth daily. 12/29/21  Yes Gollan, Kathlene November, MD  metoprolol succinate (TOPROL-XL) 100 MG 24 hr tablet Take 1 tablet (100 mg total) by mouth 2 (two) times daily. 10/12/21  Yes [name], FNP  metoprolol tartrate (LOPRESSOR) 25 MG tablet Take 1 tablet (25 mg total) by mouth daily as needed (Take for HR >100). 02/09/22  Yes Gollan, Kathlene November, MD  potassium chloride SA (KLOR-CON M) 20 MEQ  tablet Take 1 tablet (20 mEq total) by mouth daily. 04/24/21  Yes Gollan, Kathlene November, MD  rosuvastatin (CRESTOR) 10 MG tablet Take 1 tablet (10 mg total) by mouth every other day. 04/24/21  Yes [name], FNP  tamsulosin (FLOMAX) 0.4 MG CAPS capsule Take 1 capsule (0.4 mg total) by mouth daily. 04/16/21  Yes [name], MD  blood glucose meter kit and supplies KIT Use to check blood sugar once daily Dispense based on  patient and insurance preference. 04/24/21   [name], FNP  furosemide (LASIX) 40 MG tablet Take 1 tablet (40 mg total) by mouth daily. Patient taking differently: Take 40 mg by mouth daily as needed for fluid or edema. 06/04/21   Minna Merritts, MD  metFORMIN (GLUCOPHAGE) 500 MG tablet Take 2 tablets by mouth twice daily Patient taking differently: Take 1,000 mg by mouth 2 (two) times daily with a meal. 02/17/21   [name], FNP     Family History  Problem Relation Age of Onset   Heart attack Father        died in his 89's w/ cancer but dx CAD in his 82's.   Cancer Father    Other Mother 78   Heart attack Brother        died in early 66's.   Diabetes Brother    Colon cancer Neg Hx     Social History   Socioeconomic History   Marital status: Married    Spouse name: Not on file   Number of children: Not on file   Years of education: Not on file   Highest education level: Not on file  Occupational History   Not on file  Tobacco Use   Smoking status: Never    Passive exposure: Never   Smokeless tobacco: Never  Vaping Use   Vaping Use: Never used  Substance and Sexual Activity   Alcohol use: No    Alcohol/week: 0.0 standard drinks of alcohol   Drug use: No    Comment: Used marijuana a few times in college.   Sexual activity: Not Currently  Other Topics Concern   Not on file  Social History Narrative   Lives locally with wife.  Does not routinely exercise.     Social Determinants of Health   Financial Resource Strain: Not on file  Food Insecurity: Not on file  Transportation Needs: Not on file  Physical Activity: Not on file  Stress: Not on file  Social Connections: Not on file     Review of Systems: A 12 point ROS discussed and pertinent positives are indicated in the HPI above.  All other systems are negative.  Review of Systems  Constitutional:  Negative for chills and fever.  Respiratory:  Negative for cough and shortness of breath.    Cardiovascular:  Negative for chest pain.  Gastrointestinal:  Negative for abdominal pain, nausea and vomiting.  Neurological:  Negative for dizziness, syncope, weakness, light-headedness and headaches.    Vital Signs: BP (!) 139/94   Pulse 61   Temp 98.1 F (36.7 C) (Temporal)   Resp 19   Ht 6' (1.829 m)   Wt 205 lb (93 kg)   SpO2 99%   BMI 27.80 kg/m   Physical Exam Vitals and nursing note reviewed.  Constitutional:      General: He is not in acute distress. HENT:     Head: Normocephalic.     Mouth/Throat:     Mouth: Mucous membranes are  moist.     Pharynx: Oropharynx is clear. No oropharyngeal exudate or posterior oropharyngeal erythema.  Cardiovascular:     Rate and Rhythm: Normal rate and regular rhythm.  Pulmonary:     Effort: Pulmonary effort is normal.     Breath sounds: Normal breath sounds.  Abdominal:     General: There is no distension.     Palpations: Abdomen is soft.     Tenderness: There is no abdominal tenderness.  Genitourinary:    Comments: (-) BRBPR, dried blood noted Skin:    General: Skin is warm and dry.  Neurological:     Mental Status: He is alert and oriented to person, place, and time.  Psychiatric:        Mood and Affect: Mood normal.        Behavior: Behavior normal.        Thought Content: Thought content normal.        Judgment: Judgment normal.      MD Evaluation Airway: WNL Heart: WNL Abdomen: WNL Chest/ Lungs: WNL ASA  Classification: 3 Mallampati/Airway Score: One   Imaging: No results found.  Labs:  CBC: Recent Labs    05/06/21 0841 10/05/21 0653 04/14/22 1454 04/14/22 1504  WBC 7.4 7.3 10.7*  --   HGB 18.3* 17.1* 15.7 15.0  HCT 53.0* 49.6 44.4 44.0  PLT 243 219 255  --     COAGS: No results for input(s): "INR", "APTT" in the last 8760 hours.  BMP: Recent Labs    05/06/21 0841 09/28/21 1341 10/05/21 0653 04/14/22 1504  NA 140  --  139 137  K 4.3  --  3.8 3.7  CL 103  --  109 107  CO2 16*  --   23  --   GLUCOSE 155*  --  125* 139*  BUN 20  --  15 12  CALCIUM 9.3  --  8.5*  --   CREATININE 0.99 1.10 1.03 0.90  GFRNONAA  --   --  >60  --     LIVER FUNCTION TESTS: No results for input(s): "BILITOT", "AST", "ALT", "ALKPHOS", "PROT", "ALBUMIN" in the last 8760 hours.  TUMOR MARKERS: No results for input(s): "AFPTM", "CEA", "CA199", "CHROMGRNA" in the last 8760 hours.  Assessment and Plan:  67 y/o M brought to the ED after prolonged GI bleeding during polypectomy with Dr. Loletha Carrow at outpatient endoscopy center. IR has been consulted for angiogram with possible embolization.  On exam patient denies complaints, he is no longer having BRBPR at this time. He is hypertensive (SBP 170s) and did not take his regular BP medicines this morning, with HR 58-64, 99% on RA, RR 16. He is no diaphoretic and denies dyspnea, dizziness, abdominal pain.   Clinically he appears stable currently and there is no indication for angiogram given likelihood of being unable to target the bleeding vessel. We discussed possibility of needing an angiogram in the future if he were to show signs of continued bleeding - he and his wife are agreeable to angiogram if needed.  No IR procedure planned at this time, patient going for flex sig this afternoon per GI.  IR remains available as needed, please call on call IR MD if re-evaluation for angiogram is needed.  Risks and benefits of mesenteric arteriogram with intervention were discussed with the patient including, but not limited to bleeding, infection, vascular injury, contrast induced renal failure, stroke, reperfusion hemorrhage, or even death.  This interventional procedure involves the use of X-rays and because of  the nature of the planned procedure, it is possible that we will have prolonged use of X-ray fluoroscopy. Potential radiation risks to you include (but are not limited to) the following: - A slightly elevated risk for cancer  several years later in  life. This risk is typically less than 0.5% percent. This risk is low in comparison to the normal incidence of human cancer, which is 33% for women and 50% for men according to the [employer]. - Radiation induced injury can include skin redness, resembling a rash, tissue breakdown / ulcers and hair loss (which can be temporary or permanent).  The likelihood of either of these occurring depends on the difficulty of the procedure and whether you are sensitive to radiation due to previous procedures, disease, or genetic conditions.  IF your procedure requires a prolonged use of radiation, you will be notified and given written instructions for further action.  It is your responsibility to monitor the irradiated area for the 2 weeks following the procedure and to notify your physician if you are concerned that you have suffered a radiation induced injury.    All of the patient's questions were answered, patient is agreeable to proceed.  Consent signed and in IR control room.   Thank you for this interesting consult.  I greatly enjoyed meeting Jarreth Sidberry and look forward to participating in their care.  A copy of this report was sent to the requesting provider on this date.  Electronically Signed: Joaquim Nam, PA-C 04/14/2022, 3:21 PM   I spent a total of 40 Minutes n face to face in clinical consultation, greater than 50% of which was counseling/coordinating care for GI bleed.

## 2022-04-14 NOTE — ED Provider Notes (Signed)
[facility] Provider Note   CSN: [id number] Arrival date & time: 04/14/22  1355     History  Chief Complaint  Patient presents with   Rectal Bleeding    Jeffrey Ortiz is a 67 y.o. male.   Rectal Bleeding    67 year old male with medical history significant for HTN, HLD, atrial flutter, on Eliquis, DM 2, CHF last EF 45 to 50% in May 2023 who presents to the emergency department with rectal bleeding after colonoscopy.  The patient was undergoing a routine outpatient colonoscopy during which time he had a polyp removed.  He had held his Eliquis for 2 days prior to the procedure.  The patient had an EBL of 602,000 cc of blood loss.  Dr. Earleen Newport was bedside with IR on patient arrival.  800 cc of normal saline was given IV by EMS and route.  He arrived vitally stable, BP 176/104, heart rate 60 saturating 98% on room air.  He states that the bleeding has stopped. General surgery was evaluating the patient on arrival as well.  Home Medications Prior to Admission medications   Medication Sig Start Date End Date Taking? Authorizing Provider  amiodarone (PACERONE) 200 MG tablet Take 1 tablet (200 mg total) by mouth daily. 12/22/21   Sherran Needs, NP  apixaban (ELIQUIS) 5 MG TABS tablet Take 1 tablet (5 mg total) by mouth 2 (two) times daily. 04/02/22   [name], FNP  blood glucose meter kit and supplies KIT Use to check blood sugar once daily Dispense based on patient and insurance preference. 04/24/21   [name], FNP  empagliflozin (JARDIANCE) 10 MG TABS tablet Take 1 tablet (10 mg total) by mouth daily. 01/27/22   Minna Merritts, MD  furosemide (LASIX) 40 MG tablet Take 1 tablet (40 mg total) by mouth daily. Patient taking differently: Take 40 mg by mouth daily as needed for fluid or edema. 06/04/21   Minna Merritts, MD  losartan (COZAAR) 50 MG tablet Take 1 tablet (50 mg total) by mouth daily. 12/29/21   Minna Merritts,  MD  metFORMIN (GLUCOPHAGE) 500 MG tablet Take 2 tablets by mouth twice daily Patient taking differently: Take 1,000 mg by mouth 2 (two) times daily with a meal. 02/17/21   Arnett, Yvetta Coder, FNP  metoprolol succinate (TOPROL-XL) 100 MG 24 hr tablet Take 1 tablet (100 mg total) by mouth 2 (two) times daily. 10/12/21   [name], FNP  metoprolol tartrate (LOPRESSOR) 25 MG tablet Take 1 tablet (25 mg total) by mouth daily as needed (Take for HR >100). 02/09/22   Minna Merritts, MD  potassium chloride SA (KLOR-CON M) 20 MEQ tablet Take 1 tablet (20 mEq total) by mouth daily. 04/24/21   Minna Merritts, MD  rosuvastatin (CRESTOR) 10 MG tablet Take 1 tablet (10 mg total) by mouth every other day. 04/24/21   [name], FNP  tamsulosin (FLOMAX) 0.4 MG CAPS capsule Take 1 capsule (0.4 mg total) by mouth daily. 04/16/21   [name], MD      Allergies    Patient has no known allergies.    Review of Systems   Review of Systems  Gastrointestinal:  Positive for hematochezia.  All other systems reviewed and are negative.   Physical Exam Updated Vital Signs BP (!) 176/104 (BP Location: Right Arm)   Pulse 60   Ht 6' (1.829 m)   Wt 93 kg   SpO2  98%   BMI 27.80 kg/m  Physical Exam Vitals and nursing note reviewed. Exam conducted with a chaperone present.  Constitutional:      General: He is not in acute distress.    Appearance: He is well-developed.  HENT:     Head: Normocephalic and atraumatic.  Eyes:     Conjunctiva/sclera: Conjunctivae normal.  Cardiovascular:     Rate and Rhythm: Normal rate and regular rhythm.  Pulmonary:     Effort: Pulmonary effort is normal. No respiratory distress.     Breath sounds: Normal breath sounds.  Abdominal:     Palpations: Abdomen is soft.     Tenderness: There is no abdominal tenderness.  Genitourinary:    Comments: Hematochezia present Musculoskeletal:        General: No swelling.     Cervical back: Neck supple.  Skin:     General: Skin is warm and dry.     Capillary Refill: Capillary refill takes less than 2 seconds.  Neurological:     Mental Status: He is alert.  Psychiatric:        Mood and Affect: Mood normal.     ED Results / Procedures / Treatments   Labs (all labs ordered are listed, but only abnormal results are displayed) Labs Reviewed  BASIC METABOLIC PANEL  CBC WITH DIFFERENTIAL/PLATELET  PROTIME-INR  TYPE AND SCREEN  ABO/RH  PREPARE RBC (CROSSMATCH)    EKG EKG Interpretation  Date/Time:  Wednesday April 14 2022 14:03:03 EST Ventricular Rate:  60 PR Interval:  182 QRS Duration: 79 QT Interval:  463 QTC Calculation: 463 R Axis:   63 Text Interpretation: Sinus rhythm Borderline T wave abnormalities Confirmed by Regan Lemming ([id number]) on 04/14/2022 2:07:16 PM  Radiology No results found.  Procedures .Critical Care  Performed by: Regan Lemming, MD Authorized by: Regan Lemming, MD   Critical care provider statement:    Critical care time (minutes):  42   Critical care was time spent personally by me on the following activities:  Development of treatment plan with patient or surrogate, discussions with consultants, evaluation of patient's response to treatment, examination of patient, ordering and review of laboratory studies, ordering and review of radiographic studies, ordering and performing treatments and interventions, pulse oximetry, re-evaluation of patient's condition and review of old charts   Care discussed with: admitting provider       Medications Ordered in ED Medications  0.9 %  sodium chloride infusion (has no administration in time range)  0.9 %  sodium chloride infusion (Manually program via Guardrails IV Fluids) (has no administration in time range)    ED Course/ Medical Decision Making/ A&P                             Medical Decision Making Amount and/or Complexity of Data Reviewed Labs: ordered. Radiology: ordered.    67 year old male with  medical history significant for HTN, HLD, atrial flutter, on Eliquis, DM 2, CHF last EF 45 to 50% in May 2023 who presents to the emergency department with rectal bleeding after colonoscopy.  The patient was undergoing a routine outpatient colonoscopy during which time he had a polyp removed.  He had held his Eliquis for 2 days prior to the procedure.  The patient had an EBL of 602,000 cc of blood loss.  Dr. Earleen Newport was bedside with IR on patient arrival.  800 cc of normal saline was given IV by EMS and route.  He  arrived vitally stable, BP 176/104, heart rate 60 saturating 98% on room air.  He states that the bleeding has stopped. General surgery was evaluating the patient on arrival as well.   He arrived vitally stable, BP 176/104, heart rate 60 saturating 98% on room air.  He states that the bleeding has stopped.  Both IR and general surgery have been consulted by gastroenterology prior to patient arrival to the emergency department.  He arrived hemodynamically stable.  He was actively bleeding with bloody bowel movements in the ER.  He states that he is taken his home metoprolol today.  CBC, type and screen, BMP, PT/INR ordered for screening purposes.  The patient consents to blood transfusion if needed.  He remains hemodynamically stable.  Plan will be to take the patient back to the Endo suite for further evaluation.  While in the ED, the patient had a large bloody bowel movement, estimated blood loss around 500 cc.  This is equivalent to 1500 cc in total possibly therefore the patient was empirically ordered a single unit blood transfusion while awaiting laboratory evaluation.  He is then taken back to the endoscopy suite for further management.  Final Clinical Impression(s) / ED Diagnoses Final diagnoses:  Acute GI bleeding    Rx / DC Orders ED Discharge Orders     None         Regan Lemming, MD 04/14/22 1506

## 2022-04-14 NOTE — Consult Note (Signed)
Jeffrey Ortiz 07-08-1955  [id number].    Requesting MD: Dr. Wilfrid Lund Chief Complaint/Reason for Consult: GI bleed after colonoscopy   HPI: Jeffrey Ortiz is a 67 y.o. male with a hx of A. Flutter on Eliquis (last dose 2/26) who we were asked to see for lower GI bleed. Patient underwent screening colonoscopy today by Dr. Loletha Carrow during which a 23m polyp was removed from the mid rectum with hot snare (per discussion, ~10cm from the anal verge). There was brisk arterial bleeding after that was not able to be controlled despite 4 hemostatic clips and 186mepinephrine. Case concluded at 13:13 per note. He was directed to the ED for evaluation. Does report 1 bloody bm. No labs done yet. No tachycardia, HR's in 60's (took metoprolol this am). No hypotension but actually hypertensive with BP 170'/100's. Denies any abdominal pain. No prior abdominal surgeries.   ROS: ROS As above, see hpi  Family History  Problem Relation Age of Onset   Heart attack Father        died in his 6061's/ cancer but dx CAD in his 5078's  Cancer Father    Other Mother [id number] Heart attack Brother        died in early 604's  Diabetes Brother    Colon cancer Neg Hx     Past Medical History:  Diagnosis Date   [place]   a. 8.[place] s/p RFCA.   Chronic systolic CHF (congestive heart [place]) [place]   [place]. 09[place]2015 Echo: EF 40-45%, diff HK.   Essential hypertension    Hyperlipidemia    Hypertension    Overweight    Sleep apnea    "did study 2 days ago; CPAP ordered" (09/26/2015)   Type II diabetes mellitus ([facility]    Past Surgical History:  Procedure Laterality Date   ATRIAL FIBRILLATION ABLATION N/A 10/05/2021   Procedure: ATRIAL FIBRILLATION ABLATION;  Surgeon: LaVickie EpleyMD;  [facility];  Service: Cardiovascular;  Laterality: N/A;   COLONOSCOPY WITH PROPOFOL N/A 08/10/2018   Procedure: COLONOSCOPY WITH PROPOFOL;  Surgeon: TaVirgel ManifoldMD;  Location: [facility]  ENDOSCOPY;  Service: Endoscopy;  Laterality: N/A;   ELECTROPHYSIOLOGIC STUDY N/A 09/26/2015   Procedure: A-Flutter;  Surgeon: StDeboraha SprangMD;  [facility];  Service: Cardiovascular;  Laterality: N/A;   TEE WITHOUT CARDIOVERSION N/A 08/13/2015   Procedure: TRANSESOPHAGEAL ECHOCARDIOGRAM (TEE) and cardioversion;  Surgeon: TiMinna MerrittsMD;  Location: [facility] ORS;  Service: Cardiovascular;  Laterality: N/A;    Social History:  reports that he has never smoked. He has never been exposed to tobacco smoke. He has never used smokeless tobacco. He reports that he does not drink alcohol and does not use drugs.  Allergies: No Known Allergies  (Not in a hospital admission)    Physical Exam: Blood pressure (!) 176/104, pulse 60, height 6' (1.829 m), weight 93 kg, SpO2 98 %. Gen: WD/WN male who is lying in bed in nad Heart: Reg rate Lungs: Normal rate and effort Abd: Soft, ND, NT Msk: MAE's Psych: A&O x 3  No results found for this or any previous visit (from the past 48 hour(s)). No results found.  Anti-infectives (From admission, onward)    None       Assessment/Plan GI bleed after polypectomy during colonoscopy  Jeffrey Ortiz a [age].o. male with a hx of A. Flutter on Eliquis (last dose 2/26) who  underwent screening colonoscopy today by Dr. Loletha Carrow during which a 45m polyp was removed from the mid rectum with hot snare (per discussion, ~10cm from the anal verge). There was brisk arterial bleeding after that was not able to be controlled despite 4 hemostatic clips and 169mepinephrine. Labs pending. No tachycardia or hypotension. Discussed case with GI. They will attempt repeat endoscopy to try and control bleeding as first step. We will follow with you incase this is unsuccessful.   I reviewed nursing notes, last 24 h vitals and pain scores, last 48 h intake and output, last 24 h labs and trends, and last 24 h imaging results.  MiJillyn LedgerPACarlisle Endoscopy Center Ltdurgery 04/14/2022, 2:37 PM Please see Amion for pager number during day hours 7:00am-4:30pm

## 2022-04-14 NOTE — Op Note (Signed)
[facility] Patient Name: Jeffrey Ortiz Procedure Date : 04/14/2022 MRN: [id number] Attending MD: Georgian Co , , [id number] Date of Birth: 09/18/1955 CSN: [id number] Age: 67 Admit Type: Inpatient Procedure:                Flexible Sigmoidoscopy Indications:              Hematochezia Providers:                Adline Mango" Cherrie Distance, RN, Gloris Ham, Technician Referring MD:             Hospitalist team, Wilfrid Lund Medicines:                Monitored Anesthesia Care Complications:            No immediate complications. Estimated Blood Loss:     Estimated blood loss: Requiring treatment with                            transfusion. Procedure:                Pre-Anesthesia Assessment:                           - Prior to the procedure, a History and Physical                            was performed, and patient medications and                            allergies were reviewed. The patient's tolerance of                            previous anesthesia was also reviewed. The risks                            and benefits of the procedure and the sedation                            options and risks were discussed with the patient.                            All questions were answered, and informed consent                            was obtained. Prior Anticoagulants: The patient has                            taken no anticoagulant or antiplatelet agents. ASA                            Grade Assessment: III - A patient with severe                            systemic  disease. After reviewing the risks and                            benefits, the patient was deemed in satisfactory                            condition to undergo the procedure.                           After obtaining informed consent, the scope was                            passed under direct vision. The [id number]                             Olympus endoscope was introduced through the anus                            and advanced to the the sigmoid colon. The flexible                            sigmoidoscopy was performed with moderate                            difficulty due to excessive bleeding. Successful                            completion of the procedure was aided by lavage.                            The patient tolerated the procedure well. Scope In: 3:32:07 PM Scope Out: 3:52:45 PM Total Procedure Duration: 0 hours 20 minutes 38 seconds  Findings:      Clotted blood was found in the rectum.      Four clips were found in the rectum over the polypectomy site.      The postpolypectomy scar was found in the rectum. Spurting bleeding was       present. To stop active bleeding, hemostatic gel and hemostatic spray       were deployed. Several ounces of hemostatic gel and a single spray of       Hemospray were applied. Hemostatic gel and spray did not stop the       bleeding. Impression:               - Blood in the rectum.                           - Four clips in the rectum over the polypectomy                            site.                           - A single (solitary) ulcer in the rectum.  Hemostatic gel and spray applied.                           - No specimens collected. Recommendation:           - Transfer patient to IR for emergent embolization.                           - Case was discussed with IR, colorectal surgery,                            and Dr. Loletha Carrow.                           - The findings and recommendations were discussed                            with the patient's family. Procedure Code(s):        --- Professional ---                           [phone], Sigmoidoscopy, flexible; with control of                            bleeding, any method Diagnosis Code(s):        --- Professional ---                           K62.5, Hemorrhage of anus and rectum                            T18.5XXA, Foreign body in anus and rectum, initial                            encounter                           K62.6, Ulcer of anus and rectum                           K92.1, Melena (includes Hematochezia) CPT copyright 2022 [employer]. All rights reserved. The codes documented in this report are preliminary and upon coder review may  be revised to meet current compliance requirements. Dr Georgian Co "Lyndee Leo" Lorenso Courier,  04/14/2022 4:04:16 PM Number of Addenda: 0

## 2022-04-14 NOTE — ED Notes (Signed)
While RN was drawing blood pt started sweating and stating his stomach fills full. Pt stated he feels the blood coming out his rectum and proceeded to vomit. RN notified a secondary RN to assist. Secondary RN checked pt mouth and no visible blood. NS fluids were started and vitals updated. Endoscopy was informed pt was currently bleeding from rectum approximately 500 ml.  Endoscopy transported pt to room for procedure

## 2022-04-14 NOTE — Interval H&P Note (Signed)
History and Physical Interval Note:  04/14/2022 3:12 PM  Jeffrey Ortiz  has presented today for surgery, with the diagnosis of Hematochezia.  The various methods of treatment have been discussed with the patient and family. After consideration of risks, benefits and other options for treatment, the patient has consented to  Procedure(s): FLEXIBLE SIGMOIDOSCOPY (N/A) as a surgical intervention.  The patient's history has been reviewed, patient examined, no change in status, stable for surgery.  I have reviewed the patient's chart and labs.  Questions were answered to the patient's satisfaction.     Sharyn Creamer

## 2022-04-14 NOTE — Consult Note (Signed)
Referring Provider:  Regan Lemming         Primary Care Physician:  [name], FNP Primary Gastroenterologist: Wilfrid Lund            Reason for Consultation: Hematochezia                  ASSESSMENT /  PLAN    Hematochezia Post polypectomy bleed Patient presents with hematochezia suspected to be due to a post polypectomy bleed. He underwent colonoscopy earlier today when he was found to have a large rectal polyp that was resected with hot snare.  He was noted to have significant post polypectomy bleeding, suspected to be arterial in origin, which was treated with 4 clips as well as epinephrine injection.  Will plan for a flexible sigmoidoscopy today to see if this area is continuing to bleed.  I have discussed this case with my surgery colleagues who are on board.  IR is following as well.  Depending on the results of his flexible sigmoidoscopy, will determine next best steps for hemostasis. - Plan for flexible sigmoidoscopy today.  NPO for now. - Surgery and IR following.  HPI:    Jeffrey Ortiz is a 67 y.o. male with history of A-flutter on Eliquis, HFrEF, OSA, DM p/w hematochezia.   He underwent colonoscopy earlier today when he was found to have a large rectal polyp that was resected with hot snare.  He was noted to have significant post polypectomy bleeding, suspected to be arterial in origin, which was treated with 4 clips as well as epinephrine injection.  It was felt that this endoscopic treatment was not adequate to control his bleeding.  Since his colonoscopy procedure, he has had 1 episode of hematochezia with bright red blood.  He does feel abdominal fullness after his procedure.  Has mild abdominal pain.  Denies nausea or vomiting.  Denies lightheadedness, shortness of breath, or chest pain.  Last dose of Eliquis was 2 days ago.  He has not had anything to eat or drink after his colonoscopy procedure.  Colonoscopy 04/14/22:    Past Medical History:  Diagnosis Date    Atrial flutter ([place])    a. 8.2017 s/p RFCA.   Chronic systolic CHF (congestive heart failure) ([place])    a. 09/2015 Echo: EF 40-45%, diff HK.   Essential hypertension    Hyperlipidemia    Hypertension    Overweight    Sleep apnea    "did study 2 days ago; CPAP ordered" (09/26/2015)   Type II diabetes mellitus ([place])     Past Surgical History:  Procedure Laterality Date   ATRIAL FIBRILLATION ABLATION N/A 10/05/2021   Procedure: ATRIAL FIBRILLATION ABLATION;  Surgeon: Vickie Epley, MD;  Location: [facility];  Service: Cardiovascular;  Laterality: N/A;   COLONOSCOPY WITH PROPOFOL N/A 08/10/2018   Procedure: COLONOSCOPY WITH PROPOFOL;  Surgeon: Virgel Manifold, MD;  Location: [facility];  Service: Endoscopy;  Laterality: N/A;   ELECTROPHYSIOLOGIC STUDY N/A 09/26/2015   Procedure: A-Flutter;  Surgeon: Deboraha Sprang, MD;  Location: [facility];  Service: Cardiovascular;  Laterality: N/A;   TEE WITHOUT CARDIOVERSION N/A 08/13/2015   Procedure: TRANSESOPHAGEAL ECHOCARDIOGRAM (TEE) and cardioversion;  Surgeon: Minna Merritts, MD;  Location: [facility];  Service: Cardiovascular;  Laterality: N/A;    Prior to Admission medications   Medication Sig Start Date End Date Taking? Authorizing Provider  amiodarone (PACERONE) 200 MG tablet Take 1 tablet (200 mg total) by mouth daily. 12/22/21  Sherran Needs, NP  apixaban (ELIQUIS) 5 MG TABS tablet Take 1 tablet (5 mg total) by mouth 2 (two) times daily. 04/02/22   [name], FNP  blood glucose meter kit and supplies KIT Use to check blood sugar once daily Dispense based on patient and insurance preference. 04/24/21   [name], FNP  empagliflozin (JARDIANCE) 10 MG TABS tablet Take 1 tablet (10 mg total) by mouth daily. 01/27/22   Minna Merritts, MD  furosemide (LASIX) 40 MG tablet Take 1 tablet (40 mg total) by mouth daily. Patient taking differently: Take 40 mg by mouth daily as needed for fluid or edema.  06/04/21   Minna Merritts, MD  losartan (COZAAR) 50 MG tablet Take 1 tablet (50 mg total) by mouth daily. 12/29/21   Minna Merritts, MD  metFORMIN (GLUCOPHAGE) 500 MG tablet Take 2 tablets by mouth twice daily Patient taking differently: Take 1,000 mg by mouth 2 (two) times daily with a meal. 02/17/21   Arnett, Yvetta Coder, FNP  metoprolol succinate (TOPROL-XL) 100 MG 24 hr tablet Take 1 tablet (100 mg total) by mouth 2 (two) times daily. 10/12/21   [name], FNP  metoprolol tartrate (LOPRESSOR) 25 MG tablet Take 1 tablet (25 mg total) by mouth daily as needed (Take for HR >100). 02/09/22   Minna Merritts, MD  potassium chloride SA (KLOR-CON M) 20 MEQ tablet Take 1 tablet (20 mEq total) by mouth daily. 04/24/21   Minna Merritts, MD  rosuvastatin (CRESTOR) 10 MG tablet Take 1 tablet (10 mg total) by mouth every other day. 04/24/21   [name], FNP  tamsulosin (FLOMAX) 0.4 MG CAPS capsule Take 1 capsule (0.4 mg total) by mouth daily. 04/16/21   [name], MD    Current Facility-Administered Medications  Medication Dose Route Frequency Provider Last Rate Last Admin   0.9 %  sodium chloride infusion  500 mL Intravenous Once Doran Stabler, MD       Current Outpatient Medications  Medication Sig Dispense Refill   amiodarone (PACERONE) 200 MG tablet Take 1 tablet (200 mg total) by mouth daily. 90 tablet 1   apixaban (ELIQUIS) 5 MG TABS tablet Take 1 tablet (5 mg total) by mouth 2 (two) times daily. 180 tablet 3   blood glucose meter kit and supplies KIT Use to check blood sugar once daily Dispense based on patient and insurance preference. 1 each 0   empagliflozin (JARDIANCE) 10 MG TABS tablet Take 1 tablet (10 mg total) by mouth daily. 30 tablet 6   furosemide (LASIX) 40 MG tablet Take 1 tablet (40 mg total) by mouth daily. (Patient taking differently: Take 40 mg by mouth daily as needed for fluid or edema.) 30 tablet 3   losartan (COZAAR) 50 MG tablet Take 1 tablet  (50 mg total) by mouth daily. 90 tablet 3   metFORMIN (GLUCOPHAGE) 500 MG tablet Take 2 tablets by mouth twice daily (Patient taking differently: Take 1,000 mg by mouth 2 (two) times daily with a meal.) 360 tablet 1   metoprolol succinate (TOPROL-XL) 100 MG 24 hr tablet Take 1 tablet (100 mg total) by mouth 2 (two) times daily. 90 tablet 3   metoprolol tartrate (LOPRESSOR) 25 MG tablet Take 1 tablet (25 mg total) by mouth daily as needed (Take for HR >100). 30 tablet 3   potassium chloride SA (KLOR-CON M) 20 MEQ tablet Take 1 tablet (20 mEq total) by mouth daily. 90 tablet  3   rosuvastatin (CRESTOR) 10 MG tablet Take 1 tablet (10 mg total) by mouth every other day. 90 tablet 0   tamsulosin (FLOMAX) 0.4 MG CAPS capsule Take 1 capsule (0.4 mg total) by mouth daily. 30 capsule 11    Allergies as of 04/14/2022   (No Known Allergies)    Family History  Problem Relation Age of Onset   Heart attack Father        died in his 69's w/ cancer but dx CAD in his 68's.   Cancer Father    Other Mother 40   Heart attack Brother        died in early 88's.   Diabetes Brother    Colon cancer Neg Hx     Social History   Tobacco Use   Smoking status: Never    Passive exposure: Never   Smokeless tobacco: Never  Vaping Use   Vaping Use: Never used  Substance Use Topics   Alcohol use: No    Alcohol/week: 0.0 standard drinks of alcohol   Drug use: No    Comment: Used marijuana a few times in college.    Review of Systems: All systems reviewed and negative except where noted in HPI.  Physical Exam: Vital signs in last 24 hours: Temp:  [98 F (36.7 C)] 98 F (36.7 C) (02/28 1058) Pulse Rate:  [58-60] 60 (02/28 1403) Resp:  [8-20] 17 (02/28 1315) BP: (98-208)/(50-104) 176/104 (02/28 1403) SpO2:  [90 %-98 %] 98 % (02/28 1403) Weight:  [93 kg] 93 kg (02/28 1408)   General:   Awake, alert, NAD Psych:  Pleasant, cooperative. Normal mood and affect. Eyes:  Pupils equal, sclera clear, no  icterus.    Neck:  Supple; no masses Lungs:  No increased WOB Heart:  Regular rate Abdomen:  Soft, non-distended, mildly tender Rectal: Some blood noted around the rectum. Msk:  Symmetrical without gross deformities. . Neurologic:  Alert and  oriented x4;  grossly normal neurologically. Skin:  Intact without significant lesions or rashes.   Intake/Output from previous day: No intake/output data recorded. Intake/Output this shift: No intake/output data recorded.  Lab Results: No results for input(s): "WBC", "HGB", "HCT", "PLT" in the last 72 hours. BMET No results for input(s): "NA", "K", "CL", "CO2", "GLUCOSE", "BUN", "CREATININE", "CALCIUM" in the last 72 hours. LFT No results for input(s): "PROT", "ALBUMIN", "AST", "ALT", "ALKPHOS", "BILITOT", "BILIDIR", "IBILI" in the last 72 hours. PT/INR No results for input(s): "LABPROT", "INR" in the last 72 hours. Hepatitis Panel No results for input(s): "HEPBSAG", "HCVAB", "HEPAIGM", "HEPBIGM" in the last 72 hours.   .    Latest Ref Rng & Units 10/05/2021    6:53 AM 05/06/2021    8:41 AM 04/10/2021    7:42 AM  CBC  WBC 4.0 - 10.5 K/uL 7.3  7.4  7.6   Hemoglobin 13.0 - 17.0 g/dL 17.1  18.3  17.7   Hematocrit 39.0 - 52.0 % 49.6  53.0  52.6   Platelets 150 - 400 K/uL 219  243  220.0     .    Latest Ref Rng & Units 10/05/2021    6:53 AM 09/28/2021    1:41 PM 05/06/2021    8:41 AM  CMP  Glucose 70 - 99 mg/dL 125   155   BUN 8 - 23 mg/dL 15   20   Creatinine 0.61 - 1.24 mg/dL 1.03  1.10  0.99   Sodium 135 - 145 mmol/L 139   140  Potassium 3.5 - 5.1 mmol/L 3.8   4.3   Chloride 98 - 111 mmol/L 109   103   CO2 22 - 32 mmol/L 23   16   Calcium 8.9 - 10.3 mg/dL 8.5   9.3    Studies/Results: No results found.  Active Problems:   * No active hospital problems. Christia Reading, M.D. @  04/14/2022, 2:30 PM

## 2022-04-14 NOTE — ED Triage Notes (Addendum)
Pt bib ems from outpatient colonoscopy. During the procedure a polyp was removed that caused bleeding. Estimated loss 600-1000cc. Pt stopped Eliquis two days ago.  BP 170/76 HR 52 R 16 RA 96%

## 2022-04-15 DIAGNOSIS — I480 Paroxysmal atrial fibrillation: Secondary | ICD-10-CM | POA: Diagnosis not present

## 2022-04-15 DIAGNOSIS — D62 Acute posthemorrhagic anemia: Secondary | ICD-10-CM | POA: Diagnosis not present

## 2022-04-15 DIAGNOSIS — K921 Melena: Secondary | ICD-10-CM | POA: Diagnosis not present

## 2022-04-15 DIAGNOSIS — N179 Acute kidney failure, unspecified: Secondary | ICD-10-CM

## 2022-04-15 DIAGNOSIS — Z7901 Long term (current) use of anticoagulants: Secondary | ICD-10-CM

## 2022-04-15 DIAGNOSIS — K922 Gastrointestinal hemorrhage, unspecified: Secondary | ICD-10-CM | POA: Diagnosis not present

## 2022-04-15 LAB — CBC
HCT: 31.8 % — ABNORMAL LOW (ref 39.0–52.0)
HCT: 31.2 % — ABNORMAL LOW (ref 39.0–52.0)
Hemoglobin: 11.2 g/dL — ABNORMAL LOW (ref 13.0–17.0)
Hemoglobin: 10.7 g/dL — ABNORMAL LOW (ref 13.0–17.0)
MCH: 33.8 pg (ref 26.0–34.0)
MCH: 33 pg (ref 26.0–34.0)
MCHC: 35.2 g/dL (ref 30.0–36.0)
MCHC: 34.3 g/dL (ref 30.0–36.0)
MCV: 96.1 fL (ref 80.0–100.0)
MCV: 96.3 fL (ref 80.0–100.0)
Platelets: 216 10*3/uL (ref 150–400)
Platelets: 210 10*3/uL (ref 150–400)
RBC: 3.31 MIL/uL — ABNORMAL LOW (ref 4.22–5.81)
RBC: 3.24 MIL/uL — ABNORMAL LOW (ref 4.22–5.81)
RDW: 12.5 % (ref 11.5–15.5)
RDW: 12.5 % (ref 11.5–15.5)
WBC: 10.6 10*3/uL — ABNORMAL HIGH (ref 4.0–10.5)
WBC: 11.9 10*3/uL — ABNORMAL HIGH (ref 4.0–10.5)
nRBC: 0 % (ref 0.0–0.2)
nRBC: 0 % (ref 0.0–0.2)

## 2022-04-15 LAB — BASIC METABOLIC PANEL
Anion gap: 6 (ref 5–15)
BUN: 10 mg/dL (ref 8–23)
CO2: 26 mmol/L (ref 22–32)
Calcium: 8.3 mg/dL — ABNORMAL LOW (ref 8.9–10.3)
Chloride: 104 mmol/L (ref 98–111)
Creatinine, Ser: 1.25 mg/dL — ABNORMAL HIGH (ref 0.61–1.24)
GFR, Estimated: >60 mL/min (ref >60)
Glucose, Bld: 145 mg/dL — ABNORMAL HIGH (ref 70–99)
Potassium: 3.4 mmol/L — ABNORMAL LOW (ref 3.5–5.1)
Sodium: 136 mmol/L (ref 135–145)

## 2022-04-15 LAB — GLUCOSE, CAPILLARY
Glucose-Capillary: 129 mg/dL — ABNORMAL HIGH (ref 70–99)
Glucose-Capillary: 204 mg/dL — ABNORMAL HIGH (ref 70–99)
Glucose-Capillary: 114 mg/dL — ABNORMAL HIGH (ref 70–99)
Glucose-Capillary: 141 mg/dL — ABNORMAL HIGH (ref 70–99)
Glucose-Capillary: 201 mg/dL — ABNORMAL HIGH (ref 70–99)

## 2022-04-15 LAB — HEMOGLOBIN AND HEMATOCRIT, BLOOD
HCT: 34.5 % — ABNORMAL LOW (ref 39.0–52.0)
Hemoglobin: 11.5 g/dL — ABNORMAL LOW (ref 13.0–17.0)

## 2022-04-15 LAB — SURGICAL PATHOLOGY

## 2022-04-15 MED ORDER — INSULIN ASPART 100 UNIT/ML IJ SOLN
0.0000 [IU] | Freq: Three times a day (TID) | INTRAMUSCULAR | Status: DC
  Administered 2022-04-15: 1 [IU] via SUBCUTANEOUS
  Administered 2022-04-16: 3 [IU] via SUBCUTANEOUS

## 2022-04-15 MED ORDER — TAMSULOSIN HCL 0.4 MG PO CAPS
0.4000 mg | ORAL_CAPSULE | Freq: Every day | ORAL | Status: DC
  Administered 2022-04-15 – 2022-04-16 (×2): 0.4 mg via ORAL
  Filled 2022-04-15 (×2): qty 1

## 2022-04-15 MED ORDER — CHLORHEXIDINE GLUCONATE CLOTH 2 % EX PADS
6.0000 | MEDICATED_PAD | Freq: Every day | CUTANEOUS | Status: DC
  Administered 2022-04-15: 6 via TOPICAL

## 2022-04-15 MED ORDER — POTASSIUM CHLORIDE CRYS ER 20 MEQ PO TBCR
20.0000 meq | EXTENDED_RELEASE_TABLET | Freq: Once | ORAL | Status: AC
  Administered 2022-04-15: 20 meq via ORAL
  Filled 2022-04-15: qty 1

## 2022-04-15 MED ORDER — LACTATED RINGERS IV SOLN: INTRAVENOUS | Status: DC

## 2022-04-15 MED ORDER — INSULIN ASPART 100 UNIT/ML IJ SOLN: 0.0000 [IU] | Freq: Three times a day (TID) | INTRAMUSCULAR | Status: DC

## 2022-04-15 NOTE — Progress Notes (Signed)
  Subjective No acute events. Feeling well, no abdominal pain. No ongoing large bloody bms at present.  Objective: Vital signs in last 24 hours: Temp:  [97.9 F (36.6 C)-98.3 F (36.8 C)] 98.3 F (36.8 C) (02/29 0734) Pulse Rate:  [58-88] 76 (02/29 0800) Resp:  [8-25] 11 (02/29 0800) BP: (98-208)/(50-113) 144/84 (02/29 0800) SpO2:  [88 %-99 %] 96 % (02/29 0800) Arterial Line BP: (181-243)/(70-136) 240/135 (02/28 2130) Weight:  [93 kg] 93 kg (02/28 1509) Last BM Date : 04/14/22  Intake/Output from previous day: 02/28 0701 - 02/29 0700 In: 3465.9 [P.O.:120; I.V.:2845.9; IV [id number] Out: 2075 [Urine:2075] Intake/Output this shift: Total I/O In: 50.1 [I.V.:50.1] Out: -   Gen: NAD, comfortable CV: RRR Pulm: Normal work of breathing Abd: Soft, NT/ND Ext: SCDs in place  Lab Results: CBC  Recent Labs    04/14/22 1454 04/14/22 1504 04/15/22 0022 04/15/22 0500  WBC 10.7*  --   --  10.6*  HGB 15.7   < > 11.5* 11.2*  HCT 44.4   < > 34.5* 31.8*  PLT 255  --   --  216   < > = values in this interval not displayed.   BMET Recent Labs    04/14/22 1454 04/14/22 1504 04/14/22 1552 04/14/22 1629 04/14/22 1700  NA 135 137 140 138 138  K 3.7 3.7 3.2* 3.9 3.9  CL 107 107 104  --   --   CO2 18*  --   --   --   --   GLUCOSE 145* 139* 167*  --   --   BUN 11 12 11  $ --   --   CREATININE 1.09 0.90 1.00  --   --   CALCIUM 8.3*  --   --   --   --    PT/INR Recent Labs    04/14/22 1454  LABPROT 13.4  INR 1.0   ABG Recent Labs    04/14/22 1629 04/14/22 1700  PHART 7.306* 7.305*  HCO3 23.1 21.7    Studies/Results:  Anti-infectives: Anti-infectives (From admission, onward)    None        Assessment/Plan: Patient Active Problem List   Diagnosis Date Noted   GIB (gastrointestinal bleeding) 04/14/2022   Acute GI bleeding 04/14/2022   ABLA (acute blood loss anemia) 04/14/2022   Urinary hesitancy 07/14/2018   Screen for colon cancer 07/14/2018    Chronic systolic heart failure ([place]) 04/12/2016   Atrial flutter ([place]) 09/15/2015   Hyperlipidemia 09/15/2015   Testicular mass 09/15/2015   Essential hypertension 08/25/2015   DM (diabetes mellitus), type 2 ([place]) 08/25/2015   Sleep apnea 08/22/2015   Paroxysmal A-fib ([facility])    Obesity    Esophageal reflux    s/p Procedure(s): FLEXIBLE SIGMOIDOSCOPY HEMOSTASIS CONTROL 04/14/2022  Doing well Monitor hgb - stable presently Diet as tolerated Anticoagulation as per GI and primary - however would not resume today We will remain available to assist in his care moving forward - let us know if any concerns arise   LOS: 1 day   I spent a total of 35 minutes in both face-to-face and non-face-to-face activities, excluding procedures performed, for this visit on the date of this encounter.  Nadeen Landau, [facility], [place]

## 2022-04-15 NOTE — Anesthesia Postprocedure Evaluation (Signed)
Anesthesia Post Note  Patient: [name]  Procedure(s) Performed: FLEXIBLE SIGMOIDOSCOPY HEMOSTASIS CONTROL     Patient location during evaluation: ICU Anesthesia Type: General Level of consciousness: awake and alert, oriented and patient cooperative Pain management: pain level controlled Vital Signs Assessment: post-procedure vital signs reviewed and stable Respiratory status: spontaneous breathing, nonlabored ventilation and respiratory function stable Cardiovascular status: blood pressure returned to baseline and stable Postop Assessment: no apparent nausea or vomiting Anesthetic complications: no Comments: Directly to ICU for overnight monitoring, hemodynamically stable off pressors and extubated. Last Hb 9.5   No notable events documented.  Last Vitals:  Vitals:   04/15/22 0300 04/15/22 0400  BP: 125/84   Pulse: 80 71  Resp: (!) 22 18  Temp:  36.8 C  SpO2: 97% 95%    Last Pain:  Vitals:   04/15/22 0400  TempSrc: Oral  PainSc:                  Pervis Hocking

## 2022-04-15 NOTE — Progress Notes (Signed)
[facility] Progress Note  Chief Complaint: Hematochezia  History:  Jeffrey Ortiz looks and feels well today.  His wife Jeffrey Ortiz is also at the bedside, and she has been with him all night. He currently denies chest pain dyspnea or abdominal pain.  He also reports that he has not had any passage of blood per rectum overnight.  Thus far he has had nothing to eat or drink since yesterday afternoon.   Objective:   Current Facility-Administered Medications:    0.9 %  sodium chloride infusion (Manually program via Guardrails IV Fluids), , Intravenous, Once, Regan Lemming, MD   acetaminophen (TYLENOL) tablet 650 mg, 650 mg, Oral, Q4H PRN, Frederik Pear, MD, 650 mg at 04/14/22 2319   amiodarone (PACERONE) tablet 200 mg, 200 mg, Oral, Daily, Bowser, Laurel Dimmer, NP   Chlorhexidine Gluconate Cloth 2 % PADS 6 each, 6 each, Topical, Daily, Chand, Sudham, MD   insulin aspart (novoLOG) injection 0-15 Units, 0-15 Units, Subcutaneous, Q4H, Bowser, Laurel Dimmer, NP, 2 Units at 04/15/22 0455   lactated ringers infusion, , Intravenous, Continuous, Bowser, Laurel Dimmer, NP, Last Rate: 50 mL/hr at 04/15/22 0600, Infusion Verify at 04/15/22 0600   metoprolol succinate (TOPROL-XL) 24 hr tablet 100 mg, 100 mg, Oral, Daily, Jacky Kindle, MD   Oral care mouth rinse, 15 mL, Mouth Rinse, PRN, Jacky Kindle, MD   rosuvastatin (CRESTOR) tablet 10 mg, 10 mg, Oral, Daily, Bowser, Laurel Dimmer, NP   lactated ringers 50 mL/hr at 04/15/22 0600     Vital signs in last 24 hrs: Vitals:   04/15/22 0645 04/15/22 0734  BP:    Pulse: 72   Resp: 16   Temp:  98.3 F (36.8 C)  SpO2: 97%     Intake/Output Summary (Last 24 hours) at 04/15/2022 0747 Last data filed at 04/15/2022 0600 Gross per 24 hour  Intake 3415.84 ml  Output 2075 ml  Net 1340.84 ml     Physical Exam Vital signs stable, he is well-appearing, alert and conversational. HEENT: sclera anicteric, oral mucosa without lesions Neck: supple, no thyromegaly, JVD or  lymphadenopathy Cardiac: Irregular without murmurs (pulse in the 70s), no peripheral edema Pulm: clear to auscultation bilaterally, normal RR and effort noted Abdomen: soft, no tenderness, with active bowel sounds. No guarding or palpable hepatosplenomegaly Skin; warm and dry, no jaundice He also has a right groin triple-lumen venous catheter Recent Labs:     Latest Ref Rng & Units 04/15/2022    5:00 AM 04/15/2022   12:22 AM 04/14/2022    6:05 PM  CBC  WBC 4.0 - 10.5 K/uL 10.6     Hemoglobin 13.0 - 17.0 g/dL 11.2  11.5  12.0   Hematocrit 39.0 - 52.0 % 31.8  34.5  34.2   Platelets 150 - 400 K/uL 216       Recent Labs  Lab 04/14/22 1454  INR 1.0      Latest Ref Rng & Units 04/14/2022    5:00 PM 04/14/2022    4:29 PM 04/14/2022    3:52 PM  CMP  Glucose 70 - 99 mg/dL   167   BUN 8 - 23 mg/dL   11   Creatinine 0.61 - 1.24 mg/dL   1.00   Sodium 135 - 145 mmol/L 138  138  140   Potassium 3.5 - 5.1 mmol/L 3.9  3.9  3.2   Chloride 98 - 111 mmol/L   104      Radiologic studies:  The full reports from yesterday's sigmoidoscopy  and subsequent abdominal angiogram were reviewed and are on file in the chart.  I was also present during the sigmoidoscopy by Dr. Lorenso Courier yesterday.  Dr. Dema Severin from general surgery and Dr. Earleen Newport from interventional radiology were also present during the procedure, and a collaborative plan was formulated at that time.  I was also present for the latter part of his angiogram procedure in IR yesterday, and reviewed all the images and discussed the case with Dr. Earleen Newport afterward.  Assessment & Plan  Assessment: Hematochezia  Acute blood loss anemia  Multiple colon polyps, brisk arterial bleeding ensued after hot snare polypectomy of a pedunculated rectal polyp.  Atrial fibrillation, currently rate controlled.   His bleeding has stopped.  His angiogram/embolization images were quite favorable yesterday, his hemoglobin has remained stable overnight.  He has  not yet passed any blood per rectum, but he will likely will do so today and perhaps into tomorrow once he ambulates and eat something.  There was a considerable amount of fresh and clotted blood in the rectosigmoid area that could not be completely suctioned with the scope.  I am very pleased with how well he has done, and he and his wife are naturally relieved as well.  We are of course greatly appreciative of all consultative services for the care they have provided for my patient.  Plan: I have ordered a regular diet for him.  Critical care team will round on him today, and I expect they will remove his femoral central venous catheter.  He can then ambulate with assistance as deemed appropriate.  I have ordered a CBC for this evening.  This patient should remain hospitalized for further observation at least until tomorrow, but I believe he can be transferred out to the medical floor if his critical care team agrees.  My partner Dr. Lorenso Courier is our hospital consult physician this week and will be available as needed and plan to see this patient tomorrow.  Timing of resuming his oral anticoagulation will be determined based on his clinical progress prior to discharge.  Nelida Meuse III Office: [phone]

## 2022-04-15 NOTE — Progress Notes (Addendum)
NAME:  Jeffrey Ortiz, MRN:  [id number], DOB:  [birth year], LOS: 1 ADMISSION DATE:  04/14/2022, CONSULTATION DATE:  04/14/22 REFERRING MD:  Loletha Carrow -GI , CHIEF COMPLAINT:  arterial GIB   History of Present Illness:  67 yo M Afib on Eliquis (taken 2/26) who presented for outpt colonoscopy, and underwent polypectomy concomitantly.  After polypectomy, there was arterial bleeding from site.  4 clips were placed for endoscopic control. EBL about 600cc. He was then taken to [facility] ED where he had large bloody BM and was empirically given 1PRBC -- EBL about 500cc. Underwent repeat flex sig, tx with hemospray. CCS and  IR were consulted.   IR with plans for angio, possible embo + cvc placement.   PCCM is consulted for admission   Pertinent  Medical History   Afib on eliquis  Chronic systolic HF  DM2 HTN HLD   Significant Hospital Events: Including procedures, antibiotic start and stop dates in addition to other pertinent events   2/28 elective colonoscopy, polyopectomy -- > arterial bleed. Repeat flex sig, CCS IR consult. Plan for IR angio +/- embo, PCCM admission   Interim History / Subjective:  No Bms or rectal bleeding overnight.  Denies N/V, or abd pain.  C/o of soreness right groin where CVL is.  Hemodynamically stable.   Am H/H 11.2/ 31.8  Objective   Blood pressure (!) 146/95, pulse 68, temperature 99.6 F (37.6 C), temperature source Oral, resp. rate 14, height 6' (1.829 m), weight 93 kg, SpO2 97 %.        Intake/Output Summary (Last 24 hours) at 04/15/2022 1149 Last data filed at 04/15/2022 1100 Gross per 24 hour  Intake 3666.19 ml  Output 2075 ml  Net 1591.19 ml   Filed Weights   04/14/22 1408 04/14/22 1509  Weight: 93 kg 93 kg    Examination: General:  Older adult male sitting upright in bed in NAD, eating lunch HEENT: MM pink/moist Neuro: Aox4, MAE CV: rr, NSR, no murmur, right groin site wnl, CVL in place, no ecchymosis/ site soft PULM:  non labored, clear, diminished  right base GI: soft, bs+, NT/ ND, voids Extremities: warm/dry, no LE edema  Skin: no rashes  Labs reviewed.  UOP 2L /24hrs  Net +1.3L  Resolved Hospital Problem list     Assessment & Plan:   Gastrointestinal hemorrhage w/ hematochezia ABLA - multiple colon polyps,  arterial bleed following rectal polypectomy, est 1L ABLA during routine outpt colonoscopy  -s/p clipping, medical homeostatic attempts.  -s/p embolization of superior rectal arteries by IR 2/28 - did not require PRBC transfusion P - appreciate GI, IR, and CCS input - remains hemodynamically stable, H/H stable - d/c femoral CVL - transfer to tele bed - check BMET to monitor renal function - stop MIVF given diet orders> resumed per GI.  Likely will have a BM at some point now eating.  Expected to pass some residual blood in rectosigmoid area that was unable to be suctioned out, now eating and once able to mobilize - monitor H/H closely, q 12 or for new abd pain/ continued or BRBPR - cont holding eliquis, SCDs for VTE ppx  - GI to follow polyp pathology  HTN HLD Chronic HFrEF  - cont toprol XL and crestor - hold lasix pending BMET, at risk for AKI   Addendum: BMET back> sCr 1> 1.25, K 3.4, will continue MIVF given IV contrast dye load/ ABLA.  KCL 20 meq x 1.  BMET in am  Aflutter on eliquis  P - tele monitoring, remains in NSR - cont amiodarone '200mg'$  daily - lopressor as above - cont to hold home eliquis, pending recs from GI, but likely will need to be held for a week, and recommend holding for 4 days in the future prior to procedures.   DM - cont SSI prn, AC/ HS - resume metformin/ jardiance at discharge  BPH - resume flomax   Best Practice (right click and "Reselect all SmartList Selections" daily)   Diet/type: Regular consistency (see orders) DVT prophylaxis: SCD GI prophylaxis: N/A Lines: Central line and No longer needed.  Order written to d/c  Foley:  N/A Code Status:  full code by default   Last date of multidisciplinary goals of care discussion [--]  Patient updated on plan of care 2/29.  Will sign out to [facility] for pickup on 3/1.    Labs   CBC: Recent Labs  Lab 04/14/22 1454 04/14/22 1504 04/14/22 1629 04/14/22 1700 04/14/22 1805 04/15/22 0022 04/15/22 0500  WBC 10.7*  --   --   --   --   --  10.6*  NEUTROABS 8.1*  --   --   --   --   --   --   HGB 15.7   < > 10.9* 9.5* 12.0* 11.5* 11.2*  HCT 44.4   < > 32.0* 28.0* 34.2* 34.5* 31.8*  MCV 95.3  --   --   --   --   --  96.1  PLT 255  --   --   --   --   --  216   < > = values in this interval not displayed.    Basic Metabolic Panel: Recent Labs  Lab 04/14/22 1454 04/14/22 1504 04/14/22 1552 04/14/22 1629 04/14/22 1700  NA 135 137 140 138 138  K 3.7 3.7 3.2* 3.9 3.9  CL 107 107 104  --   --   CO2 18*  --   --   --   --   GLUCOSE 145* 139* 167*  --   --   BUN '11 12 11  '$ --   --   CREATININE 1.09 0.90 1.00  --   --   CALCIUM 8.3*  --   --   --   --    GFR: Estimated Creatinine Clearance: 80.8 mL/min (by C-G formula based on SCr of 1 mg/dL). Recent Labs  Lab 04/14/22 1454 04/15/22 0500  WBC 10.7* 10.6*    Liver Function Tests: No results for input(s): "AST", "ALT", "ALKPHOS", "BILITOT", "PROT", "ALBUMIN" in the last 168 hours. No results for input(s): "LIPASE", "AMYLASE" in the last 168 hours. No results for input(s): "AMMONIA" in the last 168 hours.  ABG    Component Value Date/Time   PHART 7.305 (L) 04/14/2022 1700   PCO2ART 43.6 04/14/2022 1700   PO2ART 193 (H) 04/14/2022 1700   HCO3 21.7 04/14/2022 1700   TCO2 23 04/14/2022 1700   ACIDBASEDEF 4.0 (H) 04/14/2022 1700   O2SAT 100 04/14/2022 1700     Coagulation Profile: Recent Labs  Lab 04/14/22 1454  INR 1.0    Cardiac Enzymes: No results for input(s): "CKTOTAL", "CKMB", "CKMBINDEX", "TROPONINI" in the last 168 hours.  HbA1C: Hemoglobin A1C  Date/Time Value Ref Range Status  10/12/2021 01:18 PM 6.6 (A) 4.0 - 5.6 % Final   10/21/2020 09:06 AM 6.4 (A) 4.0 - 5.6 % Final   Hgb A1c MFr Bld  Date/Time Value Ref Range Status  04/10/2021 07:42 AM 7.1 (H) 4.6 -  6.5 % Final    Comment:    Glycemic Control Guidelines for People with Diabetes:Non Diabetic:  <6%Goal of Therapy: <7%Additional Action Suggested:  >8%   02/01/2020 02:18 PM 6.3 (H) <5.7 % of total Hgb Final    Comment:    For someone without known diabetes, a hemoglobin  A1c value between 5.7% and 6.4% is consistent with prediabetes and should be confirmed with a  follow-up test. . For someone with known diabetes, a value <7% indicates that their diabetes is well controlled. A1c targets should be individualized based on duration of diabetes, age, comorbid conditions, and other considerations. . This assay result is consistent with an increased risk of diabetes. . Currently, no consensus exists regarding use of hemoglobin A1c for diagnosis of diabetes for children. .     CBG: Recent Labs  Lab 04/14/22 2145 04/14/22 2355 04/15/22 0447 04/15/22 0735 04/15/22 1129  GLUCAP 141* 156* 129* 204* 114*   Critical care time: n/a       Kennieth Rad, MSN, AG-ACNP-BC [facility] 04/15/2022, 11:49 AM  See Amion for pager If no response to pager, please call PCCM consult pager After 7:00 pm call Elink

## 2022-04-16 ENCOUNTER — Other Ambulatory Visit: Payer: 59

## 2022-04-16 LAB — BASIC METABOLIC PANEL WITH GFR
Anion gap: 5 (ref 5–15)
BUN: 16 mg/dL (ref 8–23)
CO2: 24 mmol/L (ref 22–32)
Calcium: 7.8 mg/dL — ABNORMAL LOW (ref 8.9–10.3)
Chloride: 109 mmol/L (ref 98–111)
Creatinine, Ser: 1.07 mg/dL (ref 0.61–1.24)
GFR, Estimated: >60 mL/min
Glucose, Bld: 139 mg/dL — ABNORMAL HIGH (ref 70–99)
Potassium: 3.8 mmol/L (ref 3.5–5.1)
Sodium: 138 mmol/L (ref 135–145)

## 2022-04-16 LAB — CBC
HCT: 30.6 % — ABNORMAL LOW (ref 39.0–52.0)
Hemoglobin: 10.7 g/dL — ABNORMAL LOW (ref 13.0–17.0)
MCH: 33.8 pg (ref 26.0–34.0)
MCHC: 35 g/dL (ref 30.0–36.0)
MCV: 96.5 fL (ref 80.0–100.0)
Platelets: 177 K/uL (ref 150–400)
RBC: 3.17 MIL/uL — ABNORMAL LOW (ref 4.22–5.81)
RDW: 12.5 % (ref 11.5–15.5)
WBC: 9.8 K/uL (ref 4.0–10.5)
nRBC: 0 % (ref 0.0–0.2)

## 2022-04-16 LAB — GLUCOSE, CAPILLARY: Glucose-Capillary: 203 mg/dL — ABNORMAL HIGH (ref 70–99)

## 2022-04-16 MED ORDER — APIXABAN 5 MG PO TABS: 5.0000 mg | ORAL_TABLET | Freq: Two times a day (BID) | ORAL | 3 refills | Status: DC

## 2022-04-16 NOTE — Plan of Care (Signed)
Discharge instructions discussed with patient.  Patient instructed on home medications, restrictions, and follow up appointments. Belongings gathered and sent with patient.  Patients medications discussed, patient discharged via wheelchair by this Probation officer.,

## 2022-04-16 NOTE — Discharge Instructions (Signed)
Hold Eliquis for 1 week. Okay to resume on 04/21/22

## 2022-04-16 NOTE — Progress Notes (Signed)
  Subjective No acute events. Feeling well, no abdominal pain. No further BM since admission to hospital.  Objective: Vital signs in last 24 hours: Temp:  [97.8 F (36.6 C)-99.6 F (37.6 C)] 97.8 F (36.6 C) (03/01 0803) Pulse Rate:  [48-84] 58 (03/01 0700) Resp:  [11-22] 12 (03/01 0700) BP: (98-147)/(51-95) 147/73 (03/01 0700) SpO2:  [92 %-98 %] 94 % (03/01 0700) Last BM Date : 04/14/22  Intake/Output from previous day: 02/29 0701 - 03/01 0700 In: 993.5 [P.O.:720; I.V.:273.5] Out: 850 [Urine:850] Intake/Output this shift: No intake/output data recorded.  Gen: NAD, comfortable CV: RRR Pulm: Normal work of breathing Abd: Soft, NT/ND Ext: SCDs in place  Lab Results: CBC  Recent Labs    04/15/22 1644 04/16/22 0711  WBC 11.9* 9.8  HGB 10.7* 10.7*  HCT 31.2* 30.6*  PLT 210 177   BMET Recent Labs    04/15/22 1236 04/16/22 0711  NA 136 138  K 3.4* 3.8  CL 104 109  CO2 26 24  GLUCOSE 145* 139*  BUN 10 16  CREATININE 1.25* 1.07  CALCIUM 8.3* 7.8*   PT/INR Recent Labs    04/14/22 1454  LABPROT 13.4  INR 1.0   ABG Recent Labs    04/14/22 1629 04/14/22 1700  PHART 7.306* 7.305*  HCO3 23.1 21.7    Studies/Results:  Anti-infectives: Anti-infectives (From admission, onward)    None        Assessment/Plan: Patient Active Problem List   Diagnosis Date Noted   AKI (acute kidney injury) ([place]) 04/15/2022   GIB (gastrointestinal bleeding) 04/14/2022   Acute GI bleeding 04/14/2022   ABLA (acute blood loss anemia) 04/14/2022   Urinary hesitancy 07/14/2018   Screen for colon cancer [id number]   Chronic systolic heart failure ([place]) 04/12/2016   Atrial flutter ([place]) 09/15/2015   Hyperlipidemia 09/15/2015   Testicular mass 09/15/2015   Essential hypertension 08/25/2015   DM (diabetes mellitus), type 2 ([place]) 08/25/2015   Sleep apnea 08/22/2015   Paroxysmal A-fib (HCC)    Obesity    Esophageal reflux    s/p Procedure(s): FLEXIBLE  SIGMOIDOSCOPY HEMOSTASIS CONTROL 04/14/2022  Doing great  Hgb remains stable at 10.7. Discussed that blood is cathartic and the fact that he has had no BM is in this case reassuring and he had been prepped going into this  Diet as tolerated Anticoagulation as per GI and primary We will remain available to assist in his care moving forward - let us know if any concerns arise   LOS: 2 days   I spent a total of 35 minutes in both face-to-face and non-face-to-face activities, excluding procedures performed, for this visit on the date of this encounter.  Nadeen Landau, [facility], [place]

## 2022-04-16 NOTE — Discharge Summary (Signed)
Physician Discharge Summary   Jeffrey Ortiz [id number] DOB: 1955/03/29 DOA: 04/14/2022  PCP: [name], FNP  Admit date: 04/14/2022 Discharge date: 04/16/2022   Admitted From: Home Disposition:  Home Discharging physician: Dwyane Dee, MD Barriers to discharge:   Recommendations for Outpatient Follow-up:  Repeat Hgb  Discharge Condition: stable CODE STATUS: Full Diet recommendation:  Diet Orders (From admission, onward)     Start     Ordered   04/16/22 0000  Diet - low sodium heart healthy        04/16/22 0907   04/15/22 0755  Diet 2 gram sodium Room service appropriate? Yes; Fluid consistency: Thin  Diet effective now       Question Answer Comment  Room service appropriate? Yes   Fluid consistency: Thin      04/15/22 0755            Hospital Course:   Gastrointestinal hemorrhage w/ hematochezia ABLA - multiple colon polyps,  arterial bleed following rectal polypectomy, est 1L ABLA during routine outpt colonoscopy  -s/p clipping x 4 in rectum over polypectomy site -s/p embolization of superior rectal arteries by IR 2/28 -No further bleeding after embolization and hemoglobin stabilized.  Patient considered stable for discharging home -Eliquis will remain on hold for 1 week at discharge   HTN HLD Chronic HFrEF  - cont toprol XL and crestor   Aflutter on eliquis -Continue amiodarone - Eliquis on hold for 1 week   DM -Home regimen resumed at discharge   BPH - resume flomax    The patient's chronic medical conditions were treated accordingly per the patient's home medication regimen except as noted.  On day of discharge, patient was felt deemed stable for discharge. Patient/family member advised to call PCP or come back to ER if needed.   Principal Diagnosis: GIB (gastrointestinal bleeding)  Discharge Diagnoses: Active Hospital Problems   Diagnosis Date Noted   GIB (gastrointestinal bleeding) 04/14/2022   AKI (acute kidney injury) ([place])  04/15/2022   Acute GI bleeding 04/14/2022   ABLA (acute blood loss anemia) 04/14/2022   Paroxysmal A-fib [facility])     Resolved Hospital Problems  No resolved problems to display.     Discharge Instructions     Diet - low sodium heart healthy   Complete by: As directed    Increase activity slowly   Complete by: As directed       Allergies as of 04/16/2022   No Known Allergies      Medication List     STOP taking these medications    potassium chloride SA 20 MEQ tablet Commonly known as: KLOR-CON M       TAKE these medications    amiodarone 200 MG tablet Commonly known as: PACERONE Take 1 tablet (200 mg total) by mouth daily.   apixaban 5 MG Tabs tablet Commonly known as: Eliquis Take 1 tablet (5 mg total) by mouth 2 (two) times daily. Start taking on: April 21, 2022 What changed: These instructions start on April 21, 2022. If you are unsure what to do until then, ask your doctor or other care provider.   blood glucose meter kit and supplies Kit Use to check blood sugar once daily Dispense based on patient and insurance preference.   empagliflozin 10 MG Tabs tablet Commonly known as: Jardiance Take 1 tablet (10 mg total) by mouth daily.   furosemide 40 MG tablet Commonly known as: LASIX Take 1 tablet (40 mg total) by mouth daily. What changed:  when  to take this reasons to take this   losartan 50 MG tablet Commonly known as: COZAAR Take 1 tablet (50 mg total) by mouth daily.   metFORMIN 500 MG tablet Commonly known as: GLUCOPHAGE Take 2 tablets by mouth twice daily What changed: when to take this   metoprolol succinate 100 MG 24 hr tablet Commonly known as: TOPROL-XL Take 1 tablet (100 mg total) by mouth 2 (two) times daily.   metoprolol tartrate 25 MG tablet Commonly known as: LOPRESSOR Take 1 tablet (25 mg total) by mouth daily as needed (Take for HR >100).   rosuvastatin 10 MG tablet Commonly known as: Crestor Take 1 tablet (10 mg total) by  mouth every other day.   tamsulosin 0.4 MG Caps capsule Commonly known as: FLOMAX Take 1 capsule (0.4 mg total) by mouth daily.        No Known Allergies  Consultations: GI IR  Procedures: 2/28: Flex sig 2/28: Embolization of superior rectal artery  Discharge Exam: BP 126/64   Pulse (!) 56   Temp 97.8 F (36.6 C) (Oral)   Resp 18   Ht 6' (1.829 m)   Wt 93 kg   SpO2 94%   BMI 27.80 kg/m  Physical Exam Constitutional:      General: He is not in acute distress.    Appearance: Normal appearance.  HENT:     Head: Normocephalic and atraumatic.     Mouth/Throat:     Mouth: Mucous membranes are moist.  Eyes:     Extraocular Movements: Extraocular movements intact.  Cardiovascular:     Rate and Rhythm: Normal rate and regular rhythm.     Heart sounds: Normal heart sounds.  Pulmonary:     Effort: Pulmonary effort is normal. No respiratory distress.     Breath sounds: Normal breath sounds. No wheezing.  Abdominal:     General: Bowel sounds are normal. There is no distension.     Palpations: Abdomen is soft.     Tenderness: There is no abdominal tenderness.  Musculoskeletal:        General: Normal range of motion.     Cervical back: Normal range of motion and neck supple.  Skin:    General: Skin is warm and dry.  Neurological:     General: No focal deficit present.     Mental Status: He is alert.  Psychiatric:        Mood and Affect: Mood normal.        Behavior: Behavior normal.      The results of significant diagnostics from this hospitalization (including imaging, microbiology, ancillary and laboratory) are listed below for reference.   Microbiology: Recent Results (from the past 240 hour(s))  MRSA Next Gen by PCR, Nasal     Status: None   Collection Time: 04/14/22  6:09 PM   Specimen: Nasal Mucosa; Nasal Swab  Result Value Ref Range Status   MRSA by PCR Next Gen NOT DETECTED NOT DETECTED Final    Comment: (NOTE) The GeneXpert MRSA Assay (FDA approved  for NASAL specimens only), is one component of a comprehensive MRSA colonization surveillance program. It is not intended to diagnose MRSA infection nor to guide or monitor treatment for MRSA infections. Test performance is not FDA approved in patients less than 77 years old. Performed at [facility], [place] [address] [place], [place] [address]      Labs: BNP (last 3 results) No results for input(s): "BNP" in the last 8760 hours. Basic Metabolic  Panel: Recent Labs  Lab 04/14/22 1454 04/14/22 1504 04/14/22 1552 04/14/22 1629 04/14/22 1700 04/15/22 1236 04/16/22 0711  NA 135 137 140 138 138 136 138  K 3.7 3.7 3.2* 3.9 3.9 3.4* 3.8  CL 107 107 104  --   --  104 109  CO2 18*  --   --   --   --  26 24  GLUCOSE 145* 139* 167*  --   --  145* 139*  BUN '11 12 11  '$ --   --  10 16  CREATININE 1.09 0.90 1.00  --   --  1.25* 1.07  CALCIUM 8.3*  --   --   --   --  8.3* 7.8*   Liver Function Tests: No results for input(s): "AST", "ALT", "ALKPHOS", "BILITOT", "PROT", "ALBUMIN" in the last 168 hours. No results for input(s): "LIPASE", "AMYLASE" in the last 168 hours. No results for input(s): "AMMONIA" in the last 168 hours. CBC: Recent Labs  Lab 04/14/22 1454 04/14/22 1504 04/14/22 1805 04/15/22 0022 04/15/22 0500 04/15/22 1644 04/16/22 0711  WBC 10.7*  --   --   --  10.6* 11.9* 9.8  NEUTROABS 8.1*  --   --   --   --   --   --   HGB 15.7   < > 12.0* 11.5* 11.2* 10.7* 10.7*  HCT 44.4   < > 34.2* 34.5* 31.8* 31.2* 30.6*  MCV 95.3  --   --   --  96.1 96.3 96.5  PLT 255  --   --   --  216 210 177   < > = values in this interval not displayed.   Cardiac Enzymes: No results for input(s): "CKTOTAL", "CKMB", "CKMBINDEX", "TROPONINI" in the last 168 hours. BNP: Invalid input(s): "POCBNP" CBG: Recent Labs  Lab 04/15/22 0735 04/15/22 1129 04/15/22 1536 04/15/22 2152 04/16/22 0800  GLUCAP 204* 114* 141* 201* 203*   D-Dimer No results for input(s): "DDIMER" in the last 72  hours. Hgb A1c No results for input(s): "HGBA1C" in the last 72 hours. Lipid Profile No results for input(s): "CHOL", "HDL", "LDLCALC", "TRIG", "CHOLHDL", "LDLDIRECT" in the last 72 hours. Thyroid function studies No results for input(s): "TSH", "T4TOTAL", "T3FREE", "THYROIDAB" in the last 72 hours.  Invalid input(s): "FREET3" Anemia work up No results for input(s): "VITAMINB12", "FOLATE", "FERRITIN", "TIBC", "IRON", "RETICCTPCT" in the last 72 hours. Urinalysis    Component Value Date/Time   APPEARANCEUR Clear 04/02/2020 1524   GLUCOSEU 3+ (A) 04/02/2020 1524   BILIRUBINUR Negative 04/02/2020 1524   PROTEINUR Negative 04/02/2020 1524   NITRITE Negative 04/02/2020 1524   LEUKOCYTESUR Negative 04/02/2020 1524   Sepsis Labs Recent Labs  Lab 04/14/22 1454 04/15/22 0500 04/15/22 1644 04/16/22 0711  WBC 10.7* 10.6* 11.9* 9.8   Microbiology Recent Results (from the past 240 hour(s))  MRSA Next Gen by PCR, Nasal     Status: None   Collection Time: 04/14/22  6:09 PM   Specimen: Nasal Mucosa; Nasal Swab  Result Value Ref Range Status   MRSA by PCR Next Gen NOT DETECTED NOT DETECTED Final    Comment: (NOTE) The GeneXpert MRSA Assay (FDA approved for NASAL specimens only), is one component of a comprehensive MRSA colonization surveillance program. It is not intended to diagnose MRSA infection nor to guide or monitor treatment for MRSA infections. Test performance is not FDA approved in patients less than 77 years old. Performed at [facility], [place] [address] [place], [place] [address]  Procedures/Studies: IR Angiogram Visceral Selective  Result Date: 04/15/2022 INDICATION: 67 year old male presents with post polypectomy hemorrhage. The patient presented immediately after colonoscopy, during which he was sedated with propofol. He was intubated for safety in continuation of his propofol anesthesia EXAM: ULTRASOUND-GUIDED ACCESS RIGHT COMMON FEMORAL ARTERY PELVIC  ANGIOGRAM SUPER SELECTIVE CATHETER PLACEMENT INTO MULTIPLE SUPERIOR RECTAL ARTERIES COIL EMBOLIZATION OF 2 SEPARATE SUPERIOR RECTAL ARTERY IMAGE GUIDED PLACEMENT OF RIGHT COMMON FEMORAL VEIN CENTRAL VENOUS CATHETER CELT FOR HEMOSTASIS MEDICATIONS: NONE ANESTHESIA/SEDATION: The anesthesia team was present to provide general endotracheal tube anesthesia and for patient monitoring during the procedure. Intubation was performed in interventional suite. Left radial arterial line was performed by the anesthesia team. Interventional radiology nursing staff was also present. CONTRAST:  50 cc contrast FLUOROSCOPY: Radiation Exposure Index (as provided by the fluoroscopic device): 0000000 mGy Kerma COMPLICATIONS: None PROCEDURE: Informed consent was obtained from the patient following explanation of the procedure, risks, benefits and alternatives. The patient understands, agrees and consents for the procedure. All questions were addressed. A time out was performed prior to the initiation of the procedure. Maximal barrier sterile technique utilized including caps, mask, sterile gowns, sterile gloves, large sterile drape, hand hygiene, and Betadine prep. Ultrasound survey of the right inguinal region was performed with images stored and sent to PACs, confirming patency of the vessel. A micropuncture needle was used access the right common femoral artery under ultrasound. With excellent arterial blood flow returned, and an .018 micro wire was passed through the needle, observed enter the abdominal aorta under fluoroscopy. The needle was removed, and a micropuncture sheath was placed over the wire. The inner dilator and wire were removed, and an 035 Bentson wire was advanced under fluoroscopy into the abdominal aorta. The sheath was removed and a standard 5 [place] vascular sheath was placed. The dilator was removed and the sheath was flushed. Ultrasound survey of the right common femoral vein was performed with images stored and  sent to PACs. Ultrasound confirmed patency of the vessel. A single wall needle was used access the right common femoral vein under ultrasound. With excellent blood flow returned, a Bentson wire was passed through the needle, observed to enter the IVC under fluoroscopy. The needle was removed, soft tissue dilation was performed, and a large bore triple-lumen central venous catheter was placed. The dilator was removed and the sheath was flushed. Rim catheter was advanced on the 035 wire into the lower abdominal aorta. Angiogram was performed for identification of the IMA origin. Rim catheter was used to select the origin of the IMA. Angiogram was performed. Penumbra 025 lumen microcatheter was then advanced with a 014 microwire through the IMA, further selecting the superior rectal arteries of interest. Once a distal microcatheter position was achieved, repeat angiogram was performed. This confirmed the arteries of interest. A distal microcatheter position was achieved. Coil embolization of the primary artery of interest was then performed with a combination of 2 mm and 3 mm detachable coils. Stasis was achieved. We then withdrew the microcatheter for a selection of the second artery of interest. Once we confirmed catheter position within the second artery of interest, coil embolization was performed with a combination of 1 mm and 2 mm detachable coils. Stasis was achieved. Microcatheter was withdrawn. Final angiogram was performed. All catheters and wires were removed. Calculus deployed for hemostasis. The central venous catheter was sutured in position with a stay suture and sterile dressings were applied. Patient remained hemodynamically stable throughout the procedure. No complications. No significant  blood loss. IMPRESSION: Status post ultrasound-guided access right common femoral artery for pelvic angiogram, super selective catheter position into the superior rectal arteries of interest, and coil embolization of  2 separate target arteries to stasis. Image guided placement of right common femoral vein central venous catheter. Celt for hemostasis at the arterial access site. Signed, Dulcy Fanny. Nadene Rubins, RPVI [facility] Radiology Electronically Signed   By: Corrie Mckusick D.O.   On: 04/15/2022 07:26   IR US Guide Vasc Access Right  Result Date: 04/15/2022 INDICATION: 67 year old male presents with post polypectomy hemorrhage. The patient presented immediately after colonoscopy, during which he was sedated with propofol. He was intubated for safety in continuation of his propofol anesthesia EXAM: ULTRASOUND-GUIDED ACCESS RIGHT COMMON FEMORAL ARTERY PELVIC ANGIOGRAM SUPER SELECTIVE CATHETER PLACEMENT INTO MULTIPLE SUPERIOR RECTAL ARTERIES COIL EMBOLIZATION OF 2 SEPARATE SUPERIOR RECTAL ARTERY IMAGE GUIDED PLACEMENT OF RIGHT COMMON FEMORAL VEIN CENTRAL VENOUS CATHETER CELT FOR HEMOSTASIS MEDICATIONS: NONE ANESTHESIA/SEDATION: The anesthesia team was present to provide general endotracheal tube anesthesia and for patient monitoring during the procedure. Intubation was performed in interventional suite. Left radial arterial line was performed by the anesthesia team. Interventional radiology nursing staff was also present. CONTRAST:  50 cc contrast FLUOROSCOPY: Radiation Exposure Index (as provided by the fluoroscopic device): 0000000 mGy Kerma COMPLICATIONS: None PROCEDURE: Informed consent was obtained from the patient following explanation of the procedure, risks, benefits and alternatives. The patient understands, agrees and consents for the procedure. All questions were addressed. A time out was performed prior to the initiation of the procedure. Maximal barrier sterile technique utilized including caps, mask, sterile gowns, sterile gloves, large sterile drape, hand hygiene, and Betadine prep. Ultrasound survey of the right inguinal region was performed with images stored  and sent to PACs, confirming patency of the vessel. A micropuncture needle was used access the right common femoral artery under ultrasound. With excellent arterial blood flow returned, and an .018 micro wire was passed through the needle, observed enter the abdominal aorta under fluoroscopy. The needle was removed, and a micropuncture sheath was placed over the wire. The inner dilator and wire were removed, and an 035 Bentson wire was advanced under fluoroscopy into the abdominal aorta. The sheath was removed and a standard 5 [place] vascular sheath was placed. The dilator was removed and the sheath was flushed. Ultrasound survey of the right common femoral vein was performed with images stored and sent to PACs. Ultrasound confirmed patency of the vessel. A single wall needle was used access the right common femoral vein under ultrasound. With excellent blood flow returned, a Bentson wire was passed through the needle, observed to enter the IVC under fluoroscopy. The needle was removed, soft tissue dilation was performed, and a large bore triple-lumen central venous catheter was placed. The dilator was removed and the sheath was flushed. Rim catheter was advanced on the 035 wire into the lower abdominal aorta. Angiogram was performed for identification of the IMA origin. Rim catheter was used to select the origin of the IMA. Angiogram was performed. Penumbra 025 lumen microcatheter was then advanced with a 014 microwire through the IMA, further selecting the superior rectal arteries of interest. Once a distal microcatheter position was achieved, repeat angiogram was performed. This confirmed the arteries of interest. A distal microcatheter position was achieved. Coil embolization of the primary artery of interest was then performed with a combination of 2 mm and 3 mm detachable coils. Stasis was achieved. We then withdrew  the microcatheter for a selection of the second artery of interest. Once we confirmed catheter  position within the second artery of interest, coil embolization was performed with a combination of 1 mm and 2 mm detachable coils. Stasis was achieved. Microcatheter was withdrawn. Final angiogram was performed. All catheters and wires were removed. Calculus deployed for hemostasis. The central venous catheter was sutured in position with a stay suture and sterile dressings were applied. Patient remained hemodynamically stable throughout the procedure. No complications. No significant blood loss. IMPRESSION: Status post ultrasound-guided access right common femoral artery for pelvic angiogram, super selective catheter position into the superior rectal arteries of interest, and coil embolization of 2 separate target arteries to stasis. Image guided placement of right common femoral vein central venous catheter. Celt for hemostasis at the arterial access site. Signed, Dulcy Fanny. Nadene Rubins, RPVI [facility] Inter[facility] Radiology Specialists [facility] Radiology Electronically Signed   By: Corrie Mckusick D.O.   On: 04/15/2022 07:26   IR EMBO ART  VEN HEMORR LYMPH EXTRAV  INC GUIDE ROADMAPPING  Result Date: 04/15/2022 INDICATION: 67 year old male presents with post polypectomy hemorrhage. The patient presented immediately after colonoscopy, during which he was sedated with propofol. He was intubated for safety in continuation of his propofol anesthesia EXAM: ULTRASOUND-GUIDED ACCESS RIGHT COMMON FEMORAL ARTERY PELVIC ANGIOGRAM SUPER SELECTIVE CATHETER PLACEMENT INTO MULTIPLE SUPERIOR RECTAL ARTERIES COIL EMBOLIZATION OF 2 SEPARATE SUPERIOR RECTAL ARTERY IMAGE GUIDED PLACEMENT OF RIGHT COMMON FEMORAL VEIN CENTRAL VENOUS CATHETER CELT FOR HEMOSTASIS MEDICATIONS: NONE ANESTHESIA/SEDATION: The anesthesia team was present to provide general endotracheal tube anesthesia and for patient monitoring during the procedure. Intubation was performed in interventional suite. Left radial arterial line was performed by the  anesthesia team. Interventional radiology nursing staff was also present. CONTRAST:  50 cc contrast FLUOROSCOPY: Radiation Exposure Index (as provided by the fluoroscopic device): 0000000 mGy Kerma COMPLICATIONS: None PROCEDURE: Informed consent was obtained from the patient following explanation of the procedure, risks, benefits and alternatives. The patient understands, agrees and consents for the procedure. All questions were addressed. A time out was performed prior to the initiation of the procedure. Maximal barrier sterile technique utilized including caps, mask, sterile gowns, sterile gloves, large sterile drape, hand hygiene, and Betadine prep. Ultrasound survey of the right inguinal region was performed with images stored and sent to PACs, confirming patency of the vessel. A micropuncture needle was used access the right common femoral artery under ultrasound. With excellent arterial blood flow returned, and an .018 micro wire was passed through the needle, observed enter the abdominal aorta under fluoroscopy. The needle was removed, and a micropuncture sheath was placed over the wire. The inner dilator and wire were removed, and an 035 Bentson wire was advanced under fluoroscopy into the abdominal aorta. The sheath was removed and a standard 5 [place] vascular sheath was placed. The dilator was removed and the sheath was flushed. Ultrasound survey of the right common femoral vein was performed with images stored and sent to PACs. Ultrasound confirmed patency of the vessel. A single wall needle was used access the right common femoral vein under ultrasound. With excellent blood flow returned, a Bentson wire was passed through the needle, observed to enter the IVC under fluoroscopy. The needle was removed, soft tissue dilation was performed, and a large bore triple-lumen central venous catheter was placed. The dilator was removed and the sheath was flushed. Rim catheter was advanced on the 035 wire into the  lower abdominal aorta. Angiogram was performed for  identification of the IMA origin. Rim catheter was used to select the origin of the IMA. Angiogram was performed. Penumbra 025 lumen microcatheter was then advanced with a 014 microwire through the IMA, further selecting the superior rectal arteries of interest. Once a distal microcatheter position was achieved, repeat angiogram was performed. This confirmed the arteries of interest. A distal microcatheter position was achieved. Coil embolization of the primary artery of interest was then performed with a combination of 2 mm and 3 mm detachable coils. Stasis was achieved. We then withdrew the microcatheter for a selection of the second artery of interest. Once we confirmed catheter position within the second artery of interest, coil embolization was performed with a combination of 1 mm and 2 mm detachable coils. Stasis was achieved. Microcatheter was withdrawn. Final angiogram was performed. All catheters and wires were removed. Calculus deployed for hemostasis. The central venous catheter was sutured in position with a stay suture and sterile dressings were applied. Patient remained hemodynamically stable throughout the procedure. No complications. No significant blood loss. IMPRESSION: Status post ultrasound-guided access right common femoral artery for pelvic angiogram, super selective catheter position into the superior rectal arteries of interest, and coil embolization of 2 separate target arteries to stasis. Image guided placement of right common femoral vein central venous catheter. Celt for hemostasis at the arterial access site. Signed, Dulcy Fanny. Nadene Rubins, RPVI [facility] Interventional Radiology Specialists [facility] Radiology Electronically Signed   By: Corrie Mckusick D.O.   On: 04/15/2022 07:26   IR Fluoro Guide CV Line Right  Result Date: 04/15/2022 INDICATION: 67 year old male presents with post polypectomy hemorrhage. The patient  presented immediately after colonoscopy, during which he was sedated with propofol. He was intubated for safety in continuation of his propofol anesthesia EXAM: ULTRASOUND-GUIDED ACCESS RIGHT COMMON FEMORAL ARTERY PELVIC ANGIOGRAM SUPER SELECTIVE CATHETER PLACEMENT INTO MULTIPLE SUPERIOR RECTAL ARTERIES COIL EMBOLIZATION OF 2 SEPARATE SUPERIOR RECTAL ARTERY IMAGE GUIDED PLACEMENT OF RIGHT COMMON FEMORAL VEIN CENTRAL VENOUS CATHETER CELT FOR HEMOSTASIS MEDICATIONS: NONE ANESTHESIA/SEDATION: The anesthesia team was present to provide general endotracheal tube anesthesia and for patient monitoring during the procedure. Intubation was performed in interventional suite. Left radial arterial line was performed by the anesthesia team. Interventional radiology nursing staff was also present. CONTRAST:  50 cc contrast FLUOROSCOPY: Radiation Exposure Index (as provided by the fluoroscopic device): 0000000 mGy Kerma COMPLICATIONS: None PROCEDURE: Informed consent was obtained from the patient following explanation of the procedure, risks, benefits and alternatives. The patient understands, agrees and consents for the procedure. All questions were addressed. A time out was performed prior to the initiation of the procedure. Maximal barrier sterile technique utilized including caps, mask, sterile gowns, sterile gloves, large sterile drape, hand hygiene, and Betadine prep. Ultrasound survey of the right inguinal region was performed with images stored and sent to PACs, confirming patency of the vessel. A micropuncture needle was used access the right common femoral artery under ultrasound. With excellent arterial blood flow returned, and an .018 micro wire was passed through the needle, observed enter the abdominal aorta under fluoroscopy. The needle was removed, and a micropuncture sheath was placed over the wire. The inner dilator and wire were removed, and an 035 Bentson wire was advanced under fluoroscopy into the abdominal  aorta. The sheath was removed and a standard 5 [place] vascular sheath was placed. The dilator was removed and the sheath was flushed. Ultrasound survey of the right common femoral vein was performed with images stored and sent to PACs.  Ultrasound confirmed patency of the vessel. A single wall needle was used access the right common femoral vein under ultrasound. With excellent blood flow returned, a Bentson wire was passed through the needle, observed to enter the IVC under fluoroscopy. The needle was removed, soft tissue dilation was performed, and a large bore triple-lumen central venous catheter was placed. The dilator was removed and the sheath was flushed. Rim catheter was advanced on the 035 wire into the lower abdominal aorta. Angiogram was performed for identification of the IMA origin. Rim catheter was used to select the origin of the IMA. Angiogram was performed. Penumbra 025 lumen microcatheter was then advanced with a 014 microwire through the IMA, further selecting the superior rectal arteries of interest. Once a distal microcatheter position was achieved, repeat angiogram was performed. This confirmed the arteries of interest. A distal microcatheter position was achieved. Coil embolization of the primary artery of interest was then performed with a combination of 2 mm and 3 mm detachable coils. Stasis was achieved. We then withdrew the microcatheter for a selection of the second artery of interest. Once we confirmed catheter position within the second artery of interest, coil embolization was performed with a combination of 1 mm and 2 mm detachable coils. Stasis was achieved. Microcatheter was withdrawn. Final angiogram was performed. All catheters and wires were removed. Calculus deployed for hemostasis. The central venous catheter was sutured in position with a stay suture and sterile dressings were applied. Patient remained hemodynamically stable throughout the procedure. No complications. No  significant blood loss. IMPRESSION: Status post ultrasound-guided access right common femoral artery for pelvic angiogram, super selective catheter position into the superior rectal arteries of interest, and coil embolization of 2 separate target arteries to stasis. Image guided placement of right common femoral vein central venous catheter. Celt for hemostasis at the arterial access site. Signed, Dulcy Fanny. Nadene Rubins, RPVI [facility] Radiology Electronically Signed   By: Corrie Mckusick D.O.   On: 04/15/2022 07:26   IR US Guide Vasc Access Right  Result Date: 04/15/2022 INDICATION: 67 year old male presents with post polypectomy hemorrhage. The patient presented immediately after colonoscopy, during which he was sedated with propofol. He was intubated for safety in continuation of his propofol anesthesia EXAM: ULTRASOUND-GUIDED ACCESS RIGHT COMMON FEMORAL ARTERY PELVIC ANGIOGRAM SUPER SELECTIVE CATHETER PLACEMENT INTO MULTIPLE SUPERIOR RECTAL ARTERIES COIL EMBOLIZATION OF 2 SEPARATE SUPERIOR RECTAL ARTERY IMAGE GUIDED PLACEMENT OF RIGHT COMMON FEMORAL VEIN CENTRAL VENOUS CATHETER CELT FOR HEMOSTASIS MEDICATIONS: NONE ANESTHESIA/SEDATION: The anesthesia team was present to provide general endotracheal tube anesthesia and for patient monitoring during the procedure. Intubation was performed in interventional suite. Left radial arterial line was performed by the anesthesia team. Interventional radiology nursing staff was also present. CONTRAST:  50 cc contrast FLUOROSCOPY: Radiation Exposure Index (as provided by the fluoroscopic device): 0000000 mGy Kerma COMPLICATIONS: None PROCEDURE: Informed consent was obtained from the patient following explanation of the procedure, risks, benefits and alternatives. The patient understands, agrees and consents for the procedure. All questions were addressed. A time out was performed prior to the initiation of the procedure.  Maximal barrier sterile technique utilized including caps, mask, sterile gowns, sterile gloves, large sterile drape, hand hygiene, and Betadine prep. Ultrasound survey of the right inguinal region was performed with images stored and sent to PACs, confirming patency of the vessel. A micropuncture needle was used access the right common femoral artery under ultrasound. With excellent arterial blood flow returned, and an .018 micro  wire was passed through the needle, observed enter the abdominal aorta under fluoroscopy. The needle was removed, and a micropuncture sheath was placed over the wire. The inner dilator and wire were removed, and an 035 Bentson wire was advanced under fluoroscopy into the abdominal aorta. The sheath was removed and a standard 5 [place] vascular sheath was placed. The dilator was removed and the sheath was flushed. Ultrasound survey of the right common femoral vein was performed with images stored and sent to PACs. Ultrasound confirmed patency of the vessel. A single wall needle was used access the right common femoral vein under ultrasound. With excellent blood flow returned, a Bentson wire was passed through the needle, observed to enter the IVC under fluoroscopy. The needle was removed, soft tissue dilation was performed, and a large bore triple-lumen central venous catheter was placed. The dilator was removed and the sheath was flushed. Rim catheter was advanced on the 035 wire into the lower abdominal aorta. Angiogram was performed for identification of the IMA origin. Rim catheter was used to select the origin of the IMA. Angiogram was performed. Penumbra 025 lumen microcatheter was then advanced with a 014 microwire through the IMA, further selecting the superior rectal arteries of interest. Once a distal microcatheter position was achieved, repeat angiogram was performed. This confirmed the arteries of interest. A distal microcatheter position was achieved. Coil embolization of the  primary artery of interest was then performed with a combination of 2 mm and 3 mm detachable coils. Stasis was achieved. We then withdrew the microcatheter for a selection of the second artery of interest. Once we confirmed catheter position within the second artery of interest, coil embolization was performed with a combination of 1 mm and 2 mm detachable coils. Stasis was achieved. Microcatheter was withdrawn. Final angiogram was performed. All catheters and wires were removed. Calculus deployed for hemostasis. The central venous catheter was sutured in position with a stay suture and sterile dressings were applied. Patient remained hemodynamically stable throughout the procedure. No complications. No significant blood loss. IMPRESSION: Status post ultrasound-guided access right common femoral artery for pelvic angiogram, super selective catheter position into the superior rectal arteries of interest, and coil embolization of 2 separate target arteries to stasis. Image guided placement of right common femoral vein central venous catheter. Celt for hemostasis at the arterial access site. Signed, Dulcy Fanny. Nadene Rubins, RPVI [facility] Radiology Electronically Signed   By: Corrie Mckusick D.O.   On: 04/15/2022 07:26   IR Angiogram Selective Each Additional Vessel  Result Date: 04/15/2022 INDICATION: 67 year old male presents with post polypectomy hemorrhage. The patient presented immediately after colonoscopy, during which he was sedated with propofol. He was intubated for safety in continuation of his propofol anesthesia EXAM: ULTRASOUND-GUIDED ACCESS RIGHT COMMON FEMORAL ARTERY PELVIC ANGIOGRAM SUPER SELECTIVE CATHETER PLACEMENT INTO MULTIPLE SUPERIOR RECTAL ARTERIES COIL EMBOLIZATION OF 2 SEPARATE SUPERIOR RECTAL ARTERY IMAGE GUIDED PLACEMENT OF RIGHT COMMON FEMORAL VEIN CENTRAL VENOUS CATHETER CELT FOR HEMOSTASIS MEDICATIONS: NONE ANESTHESIA/SEDATION: The  anesthesia team was present to provide general endotracheal tube anesthesia and for patient monitoring during the procedure. Intubation was performed in interventional suite. Left radial arterial line was performed by the anesthesia team. Interventional radiology nursing staff was also present. CONTRAST:  50 cc contrast FLUOROSCOPY: Radiation Exposure Index (as provided by the fluoroscopic device): 0000000 mGy Kerma COMPLICATIONS: None PROCEDURE: Informed consent was obtained from the patient following explanation of the procedure, risks, benefits and alternatives. The patient understands, agrees  and consents for the procedure. All questions were addressed. A time out was performed prior to the initiation of the procedure. Maximal barrier sterile technique utilized including caps, mask, sterile gowns, sterile gloves, large sterile drape, hand hygiene, and Betadine prep. Ultrasound survey of the right inguinal region was performed with images stored and sent to PACs, confirming patency of the vessel. A micropuncture needle was used access the right common femoral artery under ultrasound. With excellent arterial blood flow returned, and an .018 micro wire was passed through the needle, observed enter the abdominal aorta under fluoroscopy. The needle was removed, and a micropuncture sheath was placed over the wire. The inner dilator and wire were removed, and an 035 Bentson wire was advanced under fluoroscopy into the abdominal aorta. The sheath was removed and a standard 5 [place] vascular sheath was placed. The dilator was removed and the sheath was flushed. Ultrasound survey of the right common femoral vein was performed with images stored and sent to PACs. Ultrasound confirmed patency of the vessel. A single wall needle was used access the right common femoral vein under ultrasound. With excellent blood flow returned, a Bentson wire was passed through the needle, observed to enter the IVC under fluoroscopy. The needle  was removed, soft tissue dilation was performed, and a large bore triple-lumen central venous catheter was placed. The dilator was removed and the sheath was flushed. Rim catheter was advanced on the 035 wire into the lower abdominal aorta. Angiogram was performed for identification of the IMA origin. Rim catheter was used to select the origin of the IMA. Angiogram was performed. Penumbra 025 lumen microcatheter was then advanced with a 014 microwire through the IMA, further selecting the superior rectal arteries of interest. Once a distal microcatheter position was achieved, repeat angiogram was performed. This confirmed the arteries of interest. A distal microcatheter position was achieved. Coil embolization of the primary artery of interest was then performed with a combination of 2 mm and 3 mm detachable coils. Stasis was achieved. We then withdrew the microcatheter for a selection of the second artery of interest. Once we confirmed catheter position within the second artery of interest, coil embolization was performed with a combination of 1 mm and 2 mm detachable coils. Stasis was achieved. Microcatheter was withdrawn. Final angiogram was performed. All catheters and wires were removed. Calculus deployed for hemostasis. The central venous catheter was sutured in position with a stay suture and sterile dressings were applied. Patient remained hemodynamically stable throughout the procedure. No complications. No significant blood loss. IMPRESSION: Status post ultrasound-guided access right common femoral artery for pelvic angiogram, super selective catheter position into the superior rectal arteries of interest, and coil embolization of 2 separate target arteries to stasis. Image guided placement of right common femoral vein central venous catheter. Celt for hemostasis at the arterial access site. Signed, Dulcy Fanny. Nadene Rubins, RPVI [facility] Radiology  Electronically Signed   By: Corrie Mckusick D.O.   On: 04/15/2022 07:26   IR Angiogram Selective Each Additional Vessel  Result Date: 04/15/2022 INDICATION: 67 year old male presents with post polypectomy hemorrhage. The patient presented immediately after colonoscopy, during which he was sedated with propofol. He was intubated for safety in continuation of his propofol anesthesia EXAM: ULTRASOUND-GUIDED ACCESS RIGHT COMMON FEMORAL ARTERY PELVIC ANGIOGRAM SUPER SELECTIVE CATHETER PLACEMENT INTO MULTIPLE SUPERIOR RECTAL ARTERIES COIL EMBOLIZATION OF 2 SEPARATE SUPERIOR RECTAL ARTERY IMAGE GUIDED PLACEMENT OF RIGHT COMMON FEMORAL VEIN CENTRAL VENOUS CATHETER CELT FOR HEMOSTASIS  MEDICATIONS: NONE ANESTHESIA/SEDATION: The anesthesia team was present to provide general endotracheal tube anesthesia and for patient monitoring during the procedure. Intubation was performed in interventional suite. Left radial arterial line was performed by the anesthesia team. Interventional radiology nursing staff was also present. CONTRAST:  50 cc contrast FLUOROSCOPY: Radiation Exposure Index (as provided by the fluoroscopic device): 0000000 mGy Kerma COMPLICATIONS: None PROCEDURE: Informed consent was obtained from the patient following explanation of the procedure, risks, benefits and alternatives. The patient understands, agrees and consents for the procedure. All questions were addressed. A time out was performed prior to the initiation of the procedure. Maximal barrier sterile technique utilized including caps, mask, sterile gowns, sterile gloves, large sterile drape, hand hygiene, and Betadine prep. Ultrasound survey of the right inguinal region was performed with images stored and sent to PACs, confirming patency of the vessel. A micropuncture needle was used access the right common femoral artery under ultrasound. With excellent arterial blood flow returned, and an .018 micro wire was passed through the needle, observed enter the  abdominal aorta under fluoroscopy. The needle was removed, and a micropuncture sheath was placed over the wire. The inner dilator and wire were removed, and an 035 Bentson wire was advanced under fluoroscopy into the abdominal aorta. The sheath was removed and a standard 5 [place] vascular sheath was placed. The dilator was removed and the sheath was flushed. Ultrasound survey of the right common femoral vein was performed with images stored and sent to PACs. Ultrasound confirmed patency of the vessel. A single wall needle was used access the right common femoral vein under ultrasound. With excellent blood flow returned, a Bentson wire was passed through the needle, observed to enter the IVC under fluoroscopy. The needle was removed, soft tissue dilation was performed, and a large bore triple-lumen central venous catheter was placed. The dilator was removed and the sheath was flushed. Rim catheter was advanced on the 035 wire into the lower abdominal aorta. Angiogram was performed for identification of the IMA origin. Rim catheter was used to select the origin of the IMA. Angiogram was performed. Penumbra 025 lumen microcatheter was then advanced with a 014 microwire through the IMA, further selecting the superior rectal arteries of interest. Once a distal microcatheter position was achieved, repeat angiogram was performed. This confirmed the arteries of interest. A distal microcatheter position was achieved. Coil embolization of the primary artery of interest was then performed with a combination of 2 mm and 3 mm detachable coils. Stasis was achieved. We then withdrew the microcatheter for a selection of the second artery of interest. Once we confirmed catheter position within the second artery of interest, coil embolization was performed with a combination of 1 mm and 2 mm detachable coils. Stasis was achieved. Microcatheter was withdrawn. Final angiogram was performed. All catheters and wires were removed.  Calculus deployed for hemostasis. The central venous catheter was sutured in position with a stay suture and sterile dressings were applied. Patient remained hemodynamically stable throughout the procedure. No complications. No significant blood loss. IMPRESSION: Status post ultrasound-guided access right common femoral artery for pelvic angiogram, super selective catheter position into the superior rectal arteries of interest, and coil embolization of 2 separate target arteries to stasis. Image guided placement of right common femoral vein central venous catheter. Celt for hemostasis at the arterial access site. Signed, Dulcy Fanny. Nadene Rubins, RPVI [facility] Radiology Electronically Signed   By: Corrie Mckusick D.O.   On: 04/15/2022  07:26   IR Angiogram Selective Each Additional Vessel  Result Date: 04/15/2022 INDICATION: 67 year old male presents with post polypectomy hemorrhage. The patient presented immediately after colonoscopy, during which he was sedated with propofol. He was intubated for safety in continuation of his propofol anesthesia EXAM: ULTRASOUND-GUIDED ACCESS RIGHT COMMON FEMORAL ARTERY PELVIC ANGIOGRAM SUPER SELECTIVE CATHETER PLACEMENT INTO MULTIPLE SUPERIOR RECTAL ARTERIES COIL EMBOLIZATION OF 2 SEPARATE SUPERIOR RECTAL ARTERY IMAGE GUIDED PLACEMENT OF RIGHT COMMON FEMORAL VEIN CENTRAL VENOUS CATHETER CELT FOR HEMOSTASIS MEDICATIONS: NONE ANESTHESIA/SEDATION: The anesthesia team was present to provide general endotracheal tube anesthesia and for patient monitoring during the procedure. Intubation was performed in interventional suite. Left radial arterial line was performed by the anesthesia team. Interventional radiology nursing staff was also present. CONTRAST:  50 cc contrast FLUOROSCOPY: Radiation Exposure Index (as provided by the fluoroscopic device): 0000000 mGy Kerma COMPLICATIONS: None PROCEDURE: Informed consent was obtained from the  patient following explanation of the procedure, risks, benefits and alternatives. The patient understands, agrees and consents for the procedure. All questions were addressed. A time out was performed prior to the initiation of the procedure. Maximal barrier sterile technique utilized including caps, mask, sterile gowns, sterile gloves, large sterile drape, hand hygiene, and Betadine prep. Ultrasound survey of the right inguinal region was performed with images stored and sent to PACs, confirming patency of the vessel. A micropuncture needle was used access the right common femoral artery under ultrasound. With excellent arterial blood flow returned, and an .018 micro wire was passed through the needle, observed enter the abdominal aorta under fluoroscopy. The needle was removed, and a micropuncture sheath was placed over the wire. The inner dilator and wire were removed, and an 035 Bentson wire was advanced under fluoroscopy into the abdominal aorta. The sheath was removed and a standard 5 [place] vascular sheath was placed. The dilator was removed and the sheath was flushed. Ultrasound survey of the right common femoral vein was performed with images stored and sent to PACs. Ultrasound confirmed patency of the vessel. A single wall needle was used access the right common femoral vein under ultrasound. With excellent blood flow returned, a Bentson wire was passed through the needle, observed to enter the IVC under fluoroscopy. The needle was removed, soft tissue dilation was performed, and a large bore triple-lumen central venous catheter was placed. The dilator was removed and the sheath was flushed. Rim catheter was advanced on the 035 wire into the lower abdominal aorta. Angiogram was performed for identification of the IMA origin. Rim catheter was used to select the origin of the IMA. Angiogram was performed. Penumbra 025 lumen microcatheter was then advanced with a 014 microwire through the IMA, further  selecting the superior rectal arteries of interest. Once a distal microcatheter position was achieved, repeat angiogram was performed. This confirmed the arteries of interest. A distal microcatheter position was achieved. Coil embolization of the primary artery of interest was then performed with a combination of 2 mm and 3 mm detachable coils. Stasis was achieved. We then withdrew the microcatheter for a selection of the second artery of interest. Once we confirmed catheter position within the second artery of interest, coil embolization was performed with a combination of 1 mm and 2 mm detachable coils. Stasis was achieved. Microcatheter was withdrawn. Final angiogram was performed. All catheters and wires were removed. Calculus deployed for hemostasis. The central venous catheter was sutured in position with a stay suture and sterile dressings were applied. Patient remained hemodynamically stable throughout the procedure.  No complications. No significant blood loss. IMPRESSION: Status post ultrasound-guided access right common femoral artery for pelvic angiogram, super selective catheter position into the superior rectal arteries of interest, and coil embolization of 2 separate target arteries to stasis. Image guided placement of right common femoral vein central venous catheter. Celt for hemostasis at the arterial access site. Signed, Dulcy Fanny. Nadene Rubins, RPVI [facility] Radiology Electronically Signed   By: Corrie Mckusick D.O.   On: 04/15/2022 07:26     Time coordinating discharge: Over 30 minutes    Dwyane Dee, MD  [facility] 04/16/2022, 1:45 PM

## 2022-04-19 ENCOUNTER — Telehealth: Payer: Self-pay

## 2022-04-19 NOTE — Transitions of Care (Post Inpatient/ED Visit) (Signed)
   04/19/2022  Name: Woodford Saulter MRN: [id number] DOB: 03/20/1955  Today's TOC FU Call Status: Today's TOC FU Call Status:: Successful TOC FU Call Competed TOC FU Call Complete Date: 04/19/22  Transition Care Management Follow-up Telephone Call Date of Discharge: 04/16/22 Discharge Facility: [facility]) Type of Discharge: Inpatient Admission Primary Inpatient Discharge Diagnosis:: GI Bleed How have you been since you were released from the hospital?: Better Any questions or concerns?: No  Items Reviewed: Did you receive and understand the discharge instructions provided?: Yes Medications obtained and verified?: Yes (Medications Reviewed) Any new allergies since your discharge?: No Dietary orders reviewed?: Yes Do you have support at home?: Yes People in Home: spouse  Home Care and Equipment/Supplies: [place] Ordered?: NA Any new equipment or medical supplies ordered?: NA  Functional Questionnaire: Do you need assistance with bathing/showering or dressing?: No Do you need assistance with meal preparation?: No Do you need assistance with eating?: No Do you have difficulty maintaining continence: No Do you need assistance with getting out of bed/getting out of a chair/moving?: No Do you have difficulty managing or taking your medications?: No  Folllow up appointments reviewed: PCP Follow-up appointment confirmed?: Yes Date of PCP follow-up appointment?: 04/26/22 Follow-up Provider: Rozell Searing Follow-up appointment confirmed?: NA Do you need transportation to your follow-up appointment?: No Do you understand care options if your condition(s) worsen?: Yes-patient verbalized understanding    [place], [place] Nurse Health Advisor Direct Dial [phone]

## 2022-04-21 ENCOUNTER — Encounter: Payer: Self-pay | Admitting: Gastroenterology

## 2022-04-21 ENCOUNTER — Ambulatory Visit: Payer: 59 | Admitting: Urology

## 2022-04-25 ENCOUNTER — Other Ambulatory Visit: Payer: Self-pay | Admitting: Urology

## 2022-04-25 DIAGNOSIS — N138 Other obstructive and reflux uropathy: Secondary | ICD-10-CM

## 2022-04-26 ENCOUNTER — Ambulatory Visit (INDEPENDENT_AMBULATORY_CARE_PROVIDER_SITE_OTHER): Payer: Medicare Other | Admitting: Family

## 2022-04-26 ENCOUNTER — Encounter: Payer: Self-pay | Admitting: Family

## 2022-04-26 VITALS — BP 150/80 | HR 76 | Temp 97.9°F | Ht 72.0 in | Wt 210.2 lb

## 2022-04-26 DIAGNOSIS — I10 Essential (primary) hypertension: Secondary | ICD-10-CM

## 2022-04-26 DIAGNOSIS — K922 Gastrointestinal hemorrhage, unspecified: Secondary | ICD-10-CM

## 2022-04-26 DIAGNOSIS — Z1211 Encounter for screening for malignant neoplasm of colon: Secondary | ICD-10-CM

## 2022-04-26 DIAGNOSIS — E785 Hyperlipidemia, unspecified: Secondary | ICD-10-CM

## 2022-04-26 DIAGNOSIS — Z8719 Personal history of other diseases of the digestive system: Secondary | ICD-10-CM | POA: Diagnosis not present

## 2022-04-26 LAB — CBC WITH DIFFERENTIAL/PLATELET
Basophils Absolute: 0.1 10*3/uL (ref 0.0–0.1)
Basophils Relative: 0.9 % (ref 0.0–3.0)
Eosinophils Absolute: 0.2 10*3/uL (ref 0.0–0.7)
Eosinophils Relative: 1.9 % (ref 0.0–5.0)
HCT: 36 % — ABNORMAL LOW (ref 39.0–52.0)
Hemoglobin: 12.1 g/dL — ABNORMAL LOW (ref 13.0–17.0)
Lymphocytes Relative: 14.4 % (ref 12.0–46.0)
Lymphs Abs: 1.6 10*3/uL (ref 0.7–4.0)
MCHC: 33.7 g/dL (ref 30.0–36.0)
MCV: 97.4 fl (ref 78.0–100.0)
Monocytes Absolute: 0.8 10*3/uL (ref 0.1–1.0)
Monocytes Relative: 6.8 % (ref 3.0–12.0)
Neutro Abs: 8.5 10*3/uL — ABNORMAL HIGH (ref 1.4–7.7)
Neutrophils Relative %: 76 % (ref 43.0–77.0)
Platelets: 338 10*3/uL (ref 150.0–400.0)
RBC: 3.69 Mil/uL — ABNORMAL LOW (ref 4.22–5.81)
RDW: 13.3 % (ref 11.5–15.5)
WBC: 11.2 10*3/uL — ABNORMAL HIGH (ref 4.0–10.5)

## 2022-04-26 LAB — IBC + FERRITIN
Ferritin: 31.8 ng/mL (ref 22.0–322.0)
Iron: 42 ug/dL (ref 42–165)
Saturation Ratios: 10.5 % — ABNORMAL LOW (ref 20.0–50.0)
TIBC: 401.8 ug/dL (ref 250.0–450.0)
Transferrin: 287 mg/dL (ref 212.0–360.0)

## 2022-04-26 LAB — B12 AND FOLATE PANEL
Folate: 10.3 ng/mL (ref >5.9)
Vitamin B-12: 312 pg/mL (ref 211–911)

## 2022-04-26 MED ORDER — LOSARTAN POTASSIUM 25 MG PO TABS: 25.0000 mg | ORAL_TABLET | Freq: Every day | ORAL | 3 refills | Status: DC

## 2022-04-26 NOTE — Patient Instructions (Addendum)
As discussed, blood pressure is running elevated today.  My concern is that you are feeling dizzy and fatigued after recent GI bleed.  I have sent in losartan 25 mg which you can take in addition to losartan 50 mg daily.  Please call the office to schedule a fasting lab 1 week after starting losartan.  I have ordered lipid panel (cholesterol) to be obtained as well.   Only start after one week IF your dizziness has resolved  you are feeling better and blood pressures remain elevated.    Strongly encourage you to purchase a blood pressure machine so you can monitor blood pressure at home.  Goal of blood pressure be less than 130/80.  Please let me know how you are doing.

## 2022-04-26 NOTE — Progress Notes (Unsigned)
Assessment & Plan:  Screen for colon cancer     Return precautions given.   Risks, benefits, and alternatives of the medications and treatment plan prescribed today were discussed, and patient expressed understanding.   Education regarding symptom management and diagnosis given to patient on AVS either electronically or printed.  No follow-ups on file.  [name], FNP  Subjective:    Patient ID: Jeffrey Ortiz, male    DOB: [birth year], 67 y.o.   MRN: [id number]  CC: Jeffrey Ortiz is a 67 y.o. male who presents today for follow hospital follow up.   HPI: Feels well today    Hospitalization 04/14/22 and discharged 04/16/22  for outpatient colonoscopy complicated lower GI bleed.  Status post angiogram, embolization of severe rectal arteries, hemostasis achieved.  He had had Eliquis for 1 week at discharge  Colonoscopy 04/14/22 Dr Lorenso Courier, 6 polyps removed . consulted Dr Loletha Carrow Blood in rectum at the polypectomy site.  Sent to interventional radiology.  Per letter the largest polyp was precancerous.  Repeat colonoscopy in 3 years.   Hemoglobin 10.7  Compliant with amiodarone 200 mg daily, Lasix 40 mg daily prn , losartan 50 mg daily, Toprol 100 mg BID  Allergies: Patient has no known allergies. Current Outpatient Medications on File Prior to Visit  Medication Sig Dispense Refill   amiodarone (PACERONE) 200 MG tablet Take 1 tablet (200 mg total) by mouth daily. 90 tablet 1   apixaban (ELIQUIS) 5 MG TABS tablet Take 1 tablet (5 mg total) by mouth 2 (two) times daily. 180 tablet 3   blood glucose meter kit and supplies KIT Use to check blood sugar once daily Dispense based on patient and insurance preference. 1 each 0   empagliflozin (JARDIANCE) 10 MG TABS tablet Take 1 tablet (10 mg total) by mouth daily. 30 tablet 6   furosemide (LASIX) 40 MG tablet Take 1 tablet (40 mg total) by mouth daily. (Patient taking differently: Take 40 mg by mouth daily as needed for fluid or edema.)  30 tablet 3   losartan (COZAAR) 50 MG tablet Take 1 tablet (50 mg total) by mouth daily. 90 tablet 3   metFORMIN (GLUCOPHAGE) 500 MG tablet Take 2 tablets by mouth twice daily (Patient taking differently: Take 1,000 mg by mouth 2 (two) times daily with a meal.) 360 tablet 1   metoprolol succinate (TOPROL-XL) 100 MG 24 hr tablet Take 1 tablet (100 mg total) by mouth 2 (two) times daily. 90 tablet 3   metoprolol tartrate (LOPRESSOR) 25 MG tablet Take 1 tablet (25 mg total) by mouth daily as needed (Take for HR >100). 30 tablet 3   tamsulosin (FLOMAX) 0.4 MG CAPS capsule Take 1 capsule (0.4 mg total) by mouth daily. 30 capsule 11   rosuvastatin (CRESTOR) 10 MG tablet Take 1 tablet (10 mg total) by mouth every other day. (Patient not taking: Reported on 04/26/2022) 90 tablet 0   No current facility-administered medications on file prior to visit.    Review of Systems    Objective:    BP (!) 150/82   Pulse 82   Temp 97.9 F (36.6 C) (Oral)   Ht 6' (1.829 m)   Wt 210 lb 3.2 oz (95.3 kg)   SpO2 98%   BMI 28.51 kg/m  BP Readings from Last 3 Encounters:  04/26/22 (!) 150/82  04/16/22 126/64  04/14/22 (!) 155/73   Wt Readings from Last 3 Encounters:  04/26/22 210 lb 3.2 oz (95.3 kg)  04/14/22 205 lb (93  kg)  04/14/22 205 lb (93 kg)    Physical Exam

## 2022-04-27 ENCOUNTER — Telehealth: Payer: Self-pay | Admitting: Urology

## 2022-04-27 DIAGNOSIS — N401 Enlarged prostate with lower urinary tract symptoms: Secondary | ICD-10-CM

## 2022-04-27 MED ORDER — TAMSULOSIN HCL 0.4 MG PO CAPS: 0.4000 mg | ORAL_CAPSULE | Freq: Every day | ORAL | 0 refills | Status: DC

## 2022-04-27 NOTE — Assessment & Plan Note (Signed)
Hospitalization course reviewed with patient.  Medications reconciled.  He has resumed Eliquis.  Pending labs to evaluate for anemia.

## 2022-04-27 NOTE — Assessment & Plan Note (Signed)
Blood pressure somewhat elevated today. Advised to continue Amiodarone 200 mg daily,  losartan 50 mg daily, Toprol 100 mg BID .  As he has felt occasionally dizzy and fatigued since hospitalization after acute blood loss, we were cautious about increasing losartan to 75 mg too quickly.  Advised him to monitor blood pressure and if persistently remains elevated and dizziness is improving over time, he may then increase losartan to 75 mg.  He will let me know how he is doing

## 2022-04-27 NOTE — Telephone Encounter (Signed)
Pt called.  Had to r/s appt.  Pt is OUT of [place]. Please refill.  Pharmacy is [place], [place]  When RX is ready please send message in [facility]

## 2022-04-27 NOTE — Telephone Encounter (Signed)
Patient has cancelled multiple appointments, short term RX sent in to last pt to upcoming appointment.

## 2022-04-28 ENCOUNTER — Ambulatory Visit: Payer: 59 | Admitting: Urology

## 2022-05-04 ENCOUNTER — Other Ambulatory Visit: Payer: Self-pay

## 2022-05-04 DIAGNOSIS — E119 Type 2 diabetes mellitus without complications: Secondary | ICD-10-CM

## 2022-05-04 MED ORDER — ROSUVASTATIN CALCIUM 10 MG PO TABS: 10.0000 mg | ORAL_TABLET | ORAL | 0 refills | Status: DC

## 2022-05-25 ENCOUNTER — Other Ambulatory Visit: Payer: Self-pay | Admitting: Urology

## 2022-05-25 DIAGNOSIS — N138 Other obstructive and reflux uropathy: Secondary | ICD-10-CM

## 2022-06-09 ENCOUNTER — Ambulatory Visit: Payer: Medicare Other | Admitting: Urology

## 2022-06-09 ENCOUNTER — Encounter: Payer: Self-pay | Admitting: Urology

## 2022-06-09 VITALS — BP 142/88 | HR 61 | Ht 72.0 in | Wt 205.0 lb

## 2022-06-09 DIAGNOSIS — Z125 Encounter for screening for malignant neoplasm of prostate: Secondary | ICD-10-CM

## 2022-06-09 DIAGNOSIS — N138 Other obstructive and reflux uropathy: Secondary | ICD-10-CM

## 2022-06-09 DIAGNOSIS — R972 Elevated prostate specific antigen [PSA]: Secondary | ICD-10-CM

## 2022-06-09 DIAGNOSIS — N401 Enlarged prostate with lower urinary tract symptoms: Secondary | ICD-10-CM | POA: Diagnosis not present

## 2022-06-09 LAB — BLADDER SCAN AMB NON-IMAGING

## 2022-06-09 MED ORDER — TAMSULOSIN HCL 0.4 MG PO CAPS: 0.4000 mg | ORAL_CAPSULE | Freq: Every day | ORAL | 3 refills | Status: DC

## 2022-06-09 NOTE — Progress Notes (Signed)
   06/09/2022 11:26 AM   Jeffrey Ortiz [birth year] [id number]  Reason for visit: Follow up BPH, elevated PSA  HPI: I saw Jeffrey Ortiz today for the above issues.  He has a history of elevated PVRs ~200 mL and cystoscopy/TRUS showed an enlarged prostate with a high bladder neck and significant intravesical protrusion, and prostate measured 85 g on TRUS.  He initially expressed interest in HOLEP, but never followed up and opted for a trial of Flomax alone.  He has done well on the Flomax and PVRs have normalized, including normal at 44ml today.  He really denies any urinary symptoms on the Flomax.  He has a history of mildly elevated PSA of 4.8 with reassuring > 20% free in January 2022, stable from 4.1 in 2016.  PSA density is very reassuring at 0.06.  He opted to defer biopsy.  We reviewed the AUA guidelines that recommend screening every 2 to 4 years through age 2.  We again reviewed options for his BPH including continuing Flomax, addition of finasteride, or considering outlet procedure with HOLEP.  He satisfied with his urinary symptoms at this time and would like to continue Flomax.  Return precautions discussed extensively including urinary retention, gross hematuria, UTIs, or worsening urinary symptoms.  He recently had a hospitalization in February 2024 for severe bleeding after routine outpatient colonoscopy that ultimately required embolization with interventional radiology.  He continues to regain strength from that hospitalization.  I think with his recent hospitalization and catheters can defer PSA this year.  I reviewed those notes.  Flomax refilled RTC 1 year PVR, PSA reflex to free prior   Sondra Come, MD  [facility] [address] [place], [place] [address] [phone]

## 2022-06-21 ENCOUNTER — Telehealth: Payer: Self-pay | Admitting: Family

## 2022-06-21 ENCOUNTER — Other Ambulatory Visit: Payer: Self-pay | Admitting: Cardiovascular Disease

## 2022-06-21 DIAGNOSIS — I480 Paroxysmal atrial fibrillation: Secondary | ICD-10-CM

## 2022-06-21 DIAGNOSIS — I4892 Unspecified atrial flutter: Secondary | ICD-10-CM

## 2022-06-21 MED ORDER — METFORMIN HCL 500 MG PO TABS: 1000.0000 mg | ORAL_TABLET | Freq: Two times a day (BID) | ORAL | 1 refills | Status: DC

## 2022-06-21 NOTE — Telephone Encounter (Signed)
Rx sent in to pharmacy pt is aware 

## 2022-06-21 NOTE — Telephone Encounter (Signed)
Pt need a refill on metformin sent to [facility] 

## 2022-07-02 ENCOUNTER — Other Ambulatory Visit: Payer: Self-pay | Admitting: Cardiovascular Disease

## 2022-07-02 NOTE — Telephone Encounter (Signed)
last visit Date:12/29/2021 --You will need a follow up appointment in 12 months  next visit none

## 2022-09-28 ENCOUNTER — Other Ambulatory Visit: Payer: Self-pay | Admitting: Cardiovascular Disease

## 2022-11-05 ENCOUNTER — Other Ambulatory Visit: Payer: Self-pay | Admitting: Cardiovascular Disease

## 2022-11-05 DIAGNOSIS — I4892 Unspecified atrial flutter: Secondary | ICD-10-CM

## 2022-11-05 DIAGNOSIS — I480 Paroxysmal atrial fibrillation: Secondary | ICD-10-CM

## 2022-11-19 ENCOUNTER — Ambulatory Visit (INDEPENDENT_AMBULATORY_CARE_PROVIDER_SITE_OTHER): Payer: Medicare Other | Admitting: *Deleted

## 2022-11-19 ENCOUNTER — Telehealth: Payer: Self-pay | Admitting: *Deleted

## 2022-11-19 ENCOUNTER — Other Ambulatory Visit: Payer: Self-pay | Admitting: Family

## 2022-11-19 VITALS — Ht 72.0 in | Wt 200.0 lb

## 2022-11-19 DIAGNOSIS — E119 Type 2 diabetes mellitus without complications: Secondary | ICD-10-CM

## 2022-11-19 DIAGNOSIS — Z0000 Encounter for general adult medical examination without abnormal findings: Secondary | ICD-10-CM | POA: Diagnosis not present

## 2022-11-19 NOTE — Patient Instructions (Signed)
Jeffrey Ortiz , Thank you for taking time to come for your Medicare Wellness Visit. I appreciate your ongoing commitment to your health goals. Please review the following plan we discussed and let me know if I can assist you in the future.   Referrals/Orders/Follow-Ups/Clinician Recommendations: Please keep your upcoming appointment with your PCP. Call your insurance company about your medications.  This is a list of the screening recommended for you and due dates:  Health Maintenance  Topic Date Due   Zoster (Shingles) Vaccine (1 of 2) Never done   DTaP/Tdap/Td vaccine (2 - Tdap) 10/10/2011   Complete foot exam   10/21/2021   Pneumonia Vaccine (2 of 2 - PCV) 11/22/2021   Hemoglobin A1C  04/14/2022   Flu Shot  Never done   Yearly kidney health urinalysis for diabetes  10/13/2022   COVID-19 Vaccine (4 - 2023-24 season) 10/17/2022   Eye exam for diabetics  11/21/2022   Yearly kidney function blood test for diabetes  04/16/2023   Medicare Annual Wellness Visit  11/19/2023   Colon Cancer Screening  04/14/2025   HIV Screening  Completed   Hepatitis C Screening  Addressed   HPV Vaccine  Aged Out    Advanced directives: (Declined) Advance directive discussed with you today. Even though you declined this today, please call our office should you change your mind, and we can give you the proper paperwork for you to fill out.  Next Medicare Annual Wellness Visit scheduled for next year: Yes 11/25/23 @ 9:00

## 2022-11-19 NOTE — Progress Notes (Signed)
Subjective:   Jeffrey Ortiz is a 67 y.o. male who presents for an Initial Medicare Annual Wellness Visit.  Visit Complete: Virtual  I connected with  Jeffrey Ortiz on 11/19/22 by a audio enabled telemedicine application and verified that I am speaking with the correct person using two identifiers.  Patient Location: Other:  at work  Provider Location: Home Office  I discussed the limitations of evaluation and management by telemedicine. The patient expressed understanding and agreed to proceed.  Patient Medicare AWV questionnaire was completed by the patient on 11/18/22; I have confirmed that all information answered by patient is correct and no changes since this date.  Because this visit was a virtual/telehealth visit, some criteria may be missing or patient reported. Any vitals not documented were not able to be obtained and vitals that have been documented are patient reported.    Cardiac Risk Factors include: advanced age (>5men, >40 women);male gender;dyslipidemia;diabetes mellitus;hypertension;Other (see comment), Risk factor comments: History of [place] flutter     Objective:    Today's Vitals   11/19/22 0856  Weight: 200 lb (90.7 kg)  Height: 6' (1.829 m)   Body mass index is 27.12 kg/m.     11/19/2022    9:26 AM 10/05/2021    6:33 AM 08/10/2018    5:23 PM 03/16/2016   10:28 AM 09/26/2015    7:53 AM 09/25/2015    8:42 AM 08/25/2015    8:59 AM  Advanced Directives  Does Patient Have a Medical Advance Directive? No No No No No No No  Would patient like information on creating a medical advance directive? No - Patient declined No - Patient declined   No - patient declined information No - patient declined information     Current Medications (verified) Outpatient Encounter Medications as of 11/19/2022  Medication Sig   amiodarone (PACERONE) 200 MG tablet Take 1 tablet (200 mg total) by mouth daily.   blood glucose meter kit and supplies KIT Use to check blood sugar  once daily Dispense based on patient and insurance preference.   furosemide (LASIX) 40 MG tablet Take 1 tablet by mouth once daily   losartan (COZAAR) 25 MG tablet Take 1 tablet (25 mg total) by mouth daily. Take with 50mg  losartan   losartan (COZAAR) 50 MG tablet Take 1 tablet (50 mg total) by mouth daily.   metFORMIN (GLUCOPHAGE) 500 MG tablet Take 2 tablets (1,000 mg total) by mouth 2 (two) times daily.   metoprolol succinate (TOPROL-XL) 100 MG 24 hr tablet Take 1 tablet (100 mg total) by mouth 2 (two) times daily.   metoprolol tartrate (LOPRESSOR) 25 MG tablet TAKE 1 TABLET BY MOUTH ONCE DAILY AS NEEDED (TAKE  FOR  HEART  RATE  GREATER  THAN  100)   rosuvastatin (CRESTOR) 10 MG tablet Take 1 tablet (10 mg total) by mouth every other day.   tamsulosin (FLOMAX) 0.4 MG CAPS capsule Take 1 capsule (0.4 mg total) by mouth daily.   apixaban (ELIQUIS) 5 MG TABS tablet Take 1 tablet (5 mg total) by mouth 2 (two) times daily. (Patient not taking: Reported on 11/19/2022)   empagliflozin (JARDIANCE) 10 MG TABS tablet Take 1 tablet (10 mg total) by mouth daily. (Patient not taking: Reported on 11/19/2022)   No facility-administered encounter medications on file as of 11/19/2022.    Allergies (verified) Patient has no known allergies.   History: Past Medical History:  Diagnosis Date   Atrial flutter (HCC)    a. 8.2017 s/p RFCA.  Chronic systolic CHF (congestive heart failure) (HCC)    a. 09/2015 Echo: EF 40-45%, diff HK.   Essential hypertension    Hyperlipidemia    Hypertension    Overweight    Sleep apnea    "did study 2 days ago; CPAP ordered" (09/26/2015)   Type II diabetes mellitus (HCC)    Past Surgical History:  Procedure Laterality Date   ATRIAL FIBRILLATION ABLATION N/A 10/05/2021   Procedure: ATRIAL FIBRILLATION ABLATION;  Surgeon: Lanier Prude, MD;  Location: [facility] INVASIVE CV LAB;  Service: Cardiovascular;  Laterality: N/A;   COLONOSCOPY WITH PROPOFOL N/A 08/10/2018   Procedure:  COLONOSCOPY WITH PROPOFOL;  Surgeon: Pasty Spillers, MD;  Location: [facility] ENDOSCOPY;  Service: Endoscopy;  Laterality: N/A;   ELECTROPHYSIOLOGIC STUDY N/A 09/26/2015   Procedure: A-Flutter;  Surgeon: Duke Salvia, MD;  Location[facility] INVASIVE CV LAB;  Service: Cardiovascular;  Laterality: N/A;   FLEXIBLE SIGMOIDOSCOPY N/A 04/14/2022   Procedure: FLEXIBLE SIGMOIDOSCOPY;  Surgeon: Imogene Burn, MD;  Location: [facility] ENDOSCOPY;  Service: Gastroenterology;  Laterality: N/A;   HEMOSTASIS CONTROL  04/14/2022   Procedure: HEMOSTASIS CONTROL;  Surgeon: Imogene Burn, MD;  Location: [facility] ENDOSCOPY;  Service: Gastroenterology;;  pure stat and hemospray   IR ANGIOGRAM SELECTIVE EACH ADDITIONAL VESSEL  04/14/2022   IR ANGIOGRAM SELECTIVE EACH ADDITIONAL VESSEL  04/14/2022   IR ANGIOGRAM SELECTIVE EACH ADDITIONAL VESSEL  04/14/2022   IR ANGIOGRAM VISCERAL SELECTIVE  04/14/2022   IR EMBO ART  VEN HEMORR LYMPH EXTRAV  INC GUIDE ROADMAPPING  04/14/2022   IR FLUORO GUIDE CV LINE RIGHT  04/14/2022   IR US GUIDE VASC ACCESS RIGHT  04/14/2022   IR US GUIDE VASC ACCESS RIGHT  04/14/2022   TEE WITHOUT CARDIOVERSION N/A 08/13/2015   Procedure: TRANSESOPHAGEAL ECHOCARDIOGRAM (TEE) and cardioversion;  Surgeon: Antonieta Iba, MD;  Location: [facility] ORS;  Service: Cardiovascular;  Laterality: N/A;   Family History  Problem Relation Age of Onset   Heart attack Father        died in his 54's w/ cancer but dx CAD in his 7's.   Cancer Father    Other Mother 29   Heart attack Brother        died in early 38's.   Diabetes Brother    Colon cancer Neg Hx    Social History   Socioeconomic History   Marital status: Married    Spouse name: Not on file   Number of children: Not on file   Years of education: Not on file   Highest education level: Not on file  Occupational History   Not on file  Tobacco Use   Smoking status: Never    Passive exposure: Never   Smokeless tobacco: Never  Vaping Use   Vaping status: Never  Used  Substance and Sexual Activity   Alcohol use: No    Alcohol/week: 0.0 standard drinks of alcohol   Drug use: No    Comment: Used marijuana a few times in college.   Sexual activity: Not Currently  Other Topics Concern   Not on file  Social History Narrative   Lives locally with wife.  Does not routinely exercise.     Social Determinants of Health   Financial Resource Strain: Low Risk  (11/18/2022)   Overall Financial Resource Strain (CARDIA)    Difficulty of Paying Living Expenses: Not very hard  Food Insecurity: No Food Insecurity (11/18/2022)   Hunger Vital Sign    Worried About Running Out of  Food in the Last Year: Never true    Ran Out of Food in the Last Year: Never true  Transportation Needs: No Transportation Needs (11/18/2022)   PRAPARE - Administrator, Civil Service (Medical): No    Lack of Transportation (Non-Medical): No  Physical Activity: Sufficiently Active (11/18/2022)   Exercise Vital Sign    Days of Exercise per Week: 6 days    Minutes of Exercise per Session: 50 min  Stress: No Stress Concern Present (11/18/2022)   [employer] of Occupational Health - Occupational Stress Questionnaire    Feeling of Stress : Not at all  Social Connections: Moderately Integrated (11/18/2022)   Social Connection and Isolation Panel [NHANES]    Frequency of Communication with Friends and Family: Once a week    Frequency of Social Gatherings with Friends and Family: Once a week    Attends Religious Services: More than 4 times per year    Active Member of [employer] or Organizations: Yes    Attends Engineer, structural: More than 4 times per year    Marital Status: Married    Tobacco Counseling Counseling given: Not Answered   Clinical Intake:  Pre-visit preparation completed: Yes  Pain : No/denies pain     BMI - recorded: 27.12 Nutritional Status: BMI 25 -29 Overweight Nutritional Risks: None Diabetes: Yes CBG done?: No Did pt. bring in CBG  monitor from home?: No  How often do you need to have someone help you when you read instructions, pamphlets, or other written materials from your doctor or pharmacy?: 1 - Never  Interpreter Needed?: No  Information entered by :: R. Malacki Mcphearson LPN   Activities of Daily Living    11/18/2022    9:42 AM  In your present state of health, do you have any difficulty performing the following activities:  Hearing? 0  Vision? 0  Difficulty concentrating or making decisions? 0  Walking or climbing stairs? 0  Dressing or bathing? 0  Doing errands, shopping? 0  Preparing Food and eating ? N  Using the Toilet? N  In the past six months, have you accidently leaked urine? N  Do you have problems with loss of bowel control? N  Managing your Medications? N  Managing your Finances? N  Housekeeping or managing your Housekeeping? N    Patient Care Team: Allegra Grana, FNP as PCP - General (Family Medicine) Mariah Milling Tollie Pizza, MD as PCP - Cardiology (Cardiology) Lanier Prude, MD as PCP - Electrophysiology (Cardiology) Delma Freeze, FNP as Nurse Practitioner (Family Medicine) Antonieta Iba, MD as Consulting Physician (Cardiology) Erin Fulling, MD as Consulting Physician (Pulmonary Disease) Duke Salvia, MD as Consulting Physician (Cardiology) Sondra Come, MD as Consulting Physician (Urology) Jason Coop Lyn Records, FNP as Nurse Practitioner (Family Medicine)  Indicate any recent Medical Services you may have received from other than [facility] providers in the past year (date may be approximate).     Assessment:   This is a routine wellness examination for Eisen.  Hearing/Vision screen Hearing Screening - Comments:: No issues Vision Screening - Comments:: readers   Goals Addressed             This Visit's Progress    Patient Stated       Wants to continue to exercise and work out more at the GYM       Depression Screen    11/19/2022    9:20 AM 04/26/2022   11:46 AM  04/24/2021  11:26 AM 04/23/2020    9:15 AM 03/16/2016   10:31 AM 09/25/2015    8:48 AM 08/25/2015    9:00 AM  PHQ 2/9 Scores  PHQ - 2 Score 0 0 0 0 0 0 0  PHQ- 9 Score 0 0 0        Fall Risk    11/18/2022    9:42 AM 04/26/2022   11:46 AM 04/24/2021   11:25 AM 03/16/2016   10:31 AM 09/25/2015    8:48 AM  Fall Risk   Falls in the past year? 0 0 0 No No  Number falls in past yr: 0 0 0    Injury with Fall? 0 0 0    Risk for fall due to :  No Fall Risks No Fall Risks    Follow up Falls evaluation completed;Falls prevention discussed Falls evaluation completed Falls evaluation completed      MEDICARE RISK AT HOME: Medicare Risk at Home Any stairs in or around the home?: Yes If so, are there any without handrails?: No Home free of loose throw rugs in walkways, pet beds, electrical cords, etc?: No Adequate lighting in your home to reduce risk of falls?: Yes Life alert?: No Use of a cane, walker or w/c?: No Grab bars in the bathroom?: No Shower chair or bench in shower?: No Elevated toilet seat or a handicapped toilet?: No  Cognitive Function:        11/19/2022    9:26 AM  6CIT Screen  What Year? 0 points  What month? 0 points  What time? 0 points  Count back from 20 0 points  Months in reverse 0 points  Repeat phrase 0 points  Total Score 0 points    Immunizations Immunization History  Administered Date(s) Administered   Moderna Sars-Covid-2 Vaccination 03/18/2019, 04/15/2019, 02/25/2020   Pneumococcal Polysaccharide-23 07/09/2014, 05/20/2017   Td 10/09/2001    TDAP status: Due, Education has been provided regarding the importance of this vaccine. Advised may receive this vaccine at local pharmacy or Health Dept. Aware to provide a copy of the vaccination record if obtained from local pharmacy or Health Dept. Verbalized acceptance and understanding.  Flu Vaccine status: Due, Education has been provided regarding the importance of this vaccine. Advised may receive this  vaccine at local pharmacy or Health Dept. Aware to provide a copy of the vaccination record if obtained from local pharmacy or Health Dept. Verbalized acceptance and understanding.  Pneumococcal vaccine status: Due, Education has been provided regarding the importance of this vaccine. Advised may receive this vaccine at local pharmacy or Health Dept. Aware to provide a copy of the vaccination record if obtained from local pharmacy or Health Dept. Verbalized acceptance and understanding.  Covid-19 vaccine status: Information provided on how to obtain vaccines.   Qualifies for Shingles Vaccine? Yes   Zostavax completed No   Shingrix Completed?: No.    Education has been provided regarding the importance of this vaccine. Patient has been advised to call insurance company to determine out of pocket expense if they have not yet received this vaccine. Advised may also receive vaccine at local pharmacy or Health Dept. Verbalized acceptance and understanding.  Screening Tests Health Maintenance  Topic Date Due   Medicare Annual Wellness (AWV)  Never done   Zoster Vaccines- Shingrix (1 of 2) Never done   DTaP/Tdap/Td (2 - Tdap) 10/10/2011   FOOT EXAM  10/21/2021   Pneumonia Vaccine 5+ Years old (2 of 2 - PCV) 11/22/2021  HEMOGLOBIN A1C  04/14/2022   INFLUENZA VACCINE  Never done   Diabetic kidney evaluation - Urine ACR  10/13/2022   COVID-19 Vaccine (4 - 2023-24 season) 10/17/2022   OPHTHALMOLOGY EXAM  11/21/2022   Diabetic kidney evaluation - eGFR measurement  04/16/2023   Colonoscopy  04/14/2025   HIV Screening  Completed   Hepatitis C Screening  Addressed   HPV VACCINES  Aged Out    Health Maintenance  Health Maintenance Due  Topic Date Due   Medicare Annual Wellness (AWV)  Never done   Zoster Vaccines- Shingrix (1 of 2) Never done   DTaP/Tdap/Td (2 - Tdap) 10/10/2011   FOOT EXAM  10/21/2021   Pneumonia Vaccine 16+ Years old (2 of 2 - PCV) 11/22/2021   HEMOGLOBIN A1C  04/14/2022    INFLUENZA VACCINE  Never done   Diabetic kidney evaluation - Urine ACR  10/13/2022   COVID-19 Vaccine (4 - 2023-24 season) 10/17/2022    Colorectal cancer screening: Type of screening: Colonoscopy. Completed 03/2022. Repeat every 3 years  Lung Cancer Screening: (Low Dose CT Chest recommended if Age 48-80 years, 20 pack-year currently smoking OR have quit w/in 15years.) does not qualify.     Additional Screening:  Hepatitis C Screening: does qualify; Completed addressed by PCP 06/2014  Vision Screening: Recommended annual ophthalmology exams for early detection of glaucoma and other disorders of the eye. Is the patient up to date with their annual eye exam?  Yes  Who is the provider or what is the name of the office in which the patient attends annual eye exams? [facility]  If pt is not established with a provider, would they like to be referred to a provider to establish care? No .   Dental Screening: Recommended annual dental exams for proper oral hygiene  Diabetic Foot Exam: Diabetic Foot Exam: Overdue, Pt has been advised about the importance in completing this exam. Pt is scheduled for diabetic foot exam on needs completed and documented at next office visit.  Community Resource Referral / Chronic Care Management: CRR required this visit?  No   CCM required this visit?  No    Plan:     I have personally reviewed and noted the following in the patient's chart:   Medical and social history Use of alcohol, tobacco or illicit drugs  Current medications and supplements including opioid prescriptions. Patient is not currently taking opioid prescriptions. Functional ability and status Nutritional status Physical activity Advanced directives List of other physicians Hospitalizations, surgeries, and ER visits in previous 12 months Vitals Screenings to include cognitive, depression, and falls Referrals and appointments  In addition, I have reviewed and discussed with patient  certain preventive protocols, quality metrics, and best practice recommendations. A written personalized care plan for preventive services as well as general preventive health recommendations were provided to patient.     Sydell Axon, LPN   52/09/4130   After Visit Summary: (MyChart) Due to this being a telephonic visit, the after visit summary with patients personalized plan was offered to patient via MyChart   Nurse Notes: None

## 2022-11-19 NOTE — Telephone Encounter (Signed)
Spoke to pt and he stated that they told him at [facility] that they had substitutions for the [place] and the Eliquis but per Claris Che I  informed him that we have samples of Jardiance and Eliquis in the office, we provided him with 2 weeks worth of both medications until we can find out what is going on with his insurance. Pt stated that his wife will pick up samples today. Samples placed in cabinet up front Pt verbalized understanding

## 2022-11-19 NOTE — Telephone Encounter (Signed)
Called patient to perform his AWV. While reviewing patient's medications he stated that he has not taken Jardiance or Eliquis in about 18 days because of the cost. Patient was advised that he should contact his insurance company and see if there is something on their formulary that is comparable to these medications that will cost him less.  Patient was advised that he should reach out to his cardiologist and let them know he has not been taking Eliquis and he verbalized understanding. Patient stated that he will call his insurance company today and will let Rennie Plowman FNP know what he finds out. Patient was advised that this message will go to his PCP.

## 2022-11-22 ENCOUNTER — Telehealth: Payer: Self-pay

## 2022-11-22 NOTE — Progress Notes (Signed)
Care Guide Note  11/22/2022 Name: Zack Hunsaker MRN: [id number] DOB: 12/31/1955  Referred by: Allegra Grana, FNP Reason for referral : Care Coordination (Outreach to schedule with Pharm d )   Helder Bascom is a 67 y.o. year old male who is a primary care patient of Allegra Grana, FNP. Macy Mis was referred to the pharmacist for assistance related to DM.    Successful contact was made with the patient to discuss pharmacy services including being ready for the pharmacist to call at least 5 minutes before the scheduled appointment time, to have medication bottles and any blood sugar or blood pressure readings ready for review. The patient agreed to meet with the pharmacist via with the pharmacist via telephone visit on (date/time).  11/30/2022  Penne Lash, RMA Care Guide [facility]  [place], [place] [address] Direct Dial: [phone] [email or web address]

## 2022-11-25 ENCOUNTER — Other Ambulatory Visit: Payer: Self-pay | Admitting: Family

## 2022-11-25 DIAGNOSIS — E119 Type 2 diabetes mellitus without complications: Secondary | ICD-10-CM

## 2022-11-25 NOTE — Telephone Encounter (Signed)
Spoke to pt and informed him that we have placed a referral to our pharmacy team to further look into formulary with insurance prior to making any changes.  Please advise him that he needs to call us a few days prior to running out of Jardiance or Eliquis and we will continue to provide him samples until this is sorted out pt verbalized understanding

## 2022-11-26 ENCOUNTER — Other Ambulatory Visit: Payer: Self-pay | Admitting: Pharmacist

## 2022-11-26 ENCOUNTER — Encounter: Payer: Self-pay | Admitting: Pharmacist

## 2022-11-26 NOTE — Progress Notes (Signed)
11/26/2022 Name: Jeffrey Ortiz MRN: [id number] DOB: Jul 19, 1955  Subjective  Chief Complaint  Patient presents with   Medication Access    Reason for visit: Jeffrey Ortiz is a 67 y.o. year old male who presented for a telephone visit.   They were referred to the pharmacist by their PCP for assistance in managing medication access.   Care Team: Primary Care Provider: Allegra Grana, FNP  Reason for visit: ?  Jeffrey Ortiz is a 67 y.o. male who presents today for a phone visit with the pharmacist due to medication access concerns regarding their Eliquis and Jardiance. ?  Medication Access: ?  Prescription drug coverage: Payor: MEDICARE / Plan: MEDICARE PART A AND B / Product Type: *No Product type* / .   Reports that all medications are not affordable.  Jardiance and Eliquis copay has increased since entering the Coverage Gap (Donut hole). ~$600 copay.   Current Patient Assistance: None  Patient lives in a household of 2 with an estimated annual income of $ 50,000/year. Wife does not work, income is primarily from patient.   Medicare LIS Eligible: No  Couples Asset Limit: $34,360   2024 Poverty Guidelines (all states except [place] and [place])   Family Size  100%  133%  150%  200%  250%  300%  400%  500%    1  $14,580  $19,391  $21,870  $29,160  $36,450  $43,740  $58,320  $72,900    2  $19,720  $26,228  $29,580  $39,440  $49,300  $59,160  $78,880  $98,600    3  $24,860  $33,064  $37,290  $49,720  $62,150  $74,580  $99,440  $124,300    4  $30,000  $39,900  $45,000  $60,000  $75,000  $90,000  $120,000  $150,000         Assessment and Plan:   1. Medication Access Cost of Jardiance and Eliquis has increased due to patient entering the Medicare Coverage Gap. Patient lives in a household of 2 with an estimated annual income of $50,000 and therefore is not eligible for Jardiance MAP, though likely would be for [name]. Notably, patient reports taking [name] previously  without concerns.  We discussed that both Eliquis and Xarelto MAP requires OOP pharmacy expenditure of >3-4% household income which would be >$1500 on prescription medication costs in 2024. Patient will not reach this and therefore will rule out MAP programs at this time.  Alternatively, patients may apply for Xarelto withMe Coverage Gap Support program after April 1st each year for a reduced copay of $89/30 day or $250/90 day. Patient states that this would be doable for him through the end of the year. CHA2DS2-VASc Score = 4 - Patient does have documented hx of GIB so Eliquis is preferred given lower risk, especially in age >65 years. Will communicate with PCP regarding options: Xarelto through the end of the year, then switch back to Eliquis once Medicare plan restarts 02/16/23 vs Eliquis medication samples through end of the year if available.   Patient is not eligible for copay cards due to government insurance. Will collaborate with CPhT team to facilitate MAP assistance for Farxiga 10 mg daily.   Patient was provided with Medicare SHIIP information to discuss possible Medicare plans during open enrollment.  [facility] [employer] [email or web address]  Presenter, broadcasting Information Program [facility]) Coordinator Thayer Ohm [place] Phone: [phone] Email: [email or web address]   Future Appointments  Date Time Provider Department Center  11/29/2022 11:00 AM Rennie Plowman  G, FNP [facility]  11/30/2022  1:00 PM [facility] PHARMACIST [facility]  06/03/2023  2:00 PM [facility] None  06/09/2023 10:30 AM Sondra Come, MD [facility] None  11/25/2023  9:00 AM [facility] ANNUAL WELLNESS VISIT [facility]     Loree Fee, PharmD Clinical Pharmacist [facility] [phone]

## 2022-11-26 NOTE — Patient Instructions (Signed)
[name],   It was a pleasure to see you today! As we discussed:?   We will start the application process for Jardiance/Farxiga medication assistance.   I will discuss your Eliquis plan with your primary provider.  Please reach out to clinic at least a few days before running out of samples for both Jardiance and Eliquis.    Below is the local information for [facility]. This is the person who can help walk people through [facility] plans and different options. I always recommend having a list of your medications with you if you schedule an appointment.   [place] [email or web address]  Presenter, broadcasting Information Program [facility]) Coordinator Toma Deiters [facility] [address]     [place]  [address] [phone] (Ask for [facility] help)   Future Appointments  Date Time Provider Department Center  11/29/2022 11:00 AM Allegra Grana, FNP [facility]  11/30/2022  1:00 PM [facility]CM PHARMACIST [facility]  06/03/2023  2:00 PM [facility] None  06/09/2023 10:30 AM Sondra Come, MD [facility] None  11/25/2023  9:00 AM [facility] ANNUAL WELLNESS VISIT [facility]    Berenice Primas, PharmD - Clinical Pharmacist

## 2022-11-29 ENCOUNTER — Ambulatory Visit: Payer: Medicare Other | Admitting: Family

## 2022-11-30 ENCOUNTER — Other Ambulatory Visit: Payer: Medicare Other

## 2022-12-01 ENCOUNTER — Telehealth: Payer: Self-pay | Admitting: Family

## 2022-12-01 NOTE — Telephone Encounter (Signed)
Pt called stating he is down to his last pill for jardiance and eliquis and he can not afford it so he would like some samples

## 2022-12-01 NOTE — Telephone Encounter (Signed)
Spoke to [employer] in regards to her notes in pt chart and per [employer].. For now, if we have samples of Jardiance then it would be great to provide them to the patient!  LZ If he is approved for [name] it will take a few weeks to process/mail/ship and I will reach back out to patient if approved . Spoke to pt and informed him that we did have samples and I provided him with Jardiance 10 mg 4 boxes and Eliquis 5 mg 2 boxes placed upfront in cabinet for wife to pick up for pt

## 2022-12-02 ENCOUNTER — Telehealth: Payer: Self-pay

## 2022-12-02 NOTE — Telephone Encounter (Signed)
Spoke to pt and Copywriter, advertising and provided him with samples until they find out if he is eligible to receive help with paying for medication

## 2022-12-02 NOTE — Telephone Encounter (Signed)
CONFIRMED.

## 2022-12-07 ENCOUNTER — Other Ambulatory Visit: Payer: Self-pay | Admitting: Family

## 2022-12-08 ENCOUNTER — Other Ambulatory Visit: Payer: Self-pay

## 2022-12-08 MED ORDER — AMIODARONE HCL 200 MG PO TABS: 200.0000 mg | ORAL_TABLET | Freq: Every day | ORAL | 1 refills | Status: DC

## 2022-12-13 ENCOUNTER — Telehealth: Payer: Self-pay

## 2022-12-13 NOTE — Telephone Encounter (Signed)
-----   Message from Loree Fee sent at 11/26/2022  4:29 PM EDT ----- Hello!   Could patient be assisted with application for Farxiga 10 mg daily, please? He is currently on Jardiance, though his estimated income for 2 is ~50,000. If you feel he would qualify for Jardiance based on his actual tax documents, that would be fine!   Otherwise, okay to switch to [name] 10 mg daily.   Thank you!

## 2022-12-15 NOTE — Telephone Encounter (Signed)
 PAP: PAP application for Farxiga, Publishing rights manager ([employer])) has been mailed to pt's home address on file. Will fax provider portion of application to provider's office when pt's portion is received.    PLEASE BE ADVISED

## 2022-12-20 ENCOUNTER — Ambulatory Visit: Payer: Medicare Other | Admitting: Family

## 2023-01-07 ENCOUNTER — Other Ambulatory Visit: Payer: Self-pay | Admitting: Cardiovascular Disease

## 2023-01-07 ENCOUNTER — Telehealth: Payer: Self-pay | Admitting: Cardiovascular Disease

## 2023-01-07 NOTE — Telephone Encounter (Signed)
 Left voice mail, pt needs appt scheduled from recall.

## 2023-01-07 NOTE — Telephone Encounter (Signed)
Left voice mail

## 2023-01-07 NOTE — Telephone Encounter (Signed)
last visit: 12/29/21 with plan to f/u in 12 months.  next visit:  none/active recall  Please schedule f/u appt.  Thanks!

## 2023-01-10 ENCOUNTER — Ambulatory Visit (INDEPENDENT_AMBULATORY_CARE_PROVIDER_SITE_OTHER): Payer: Medicare Other | Admitting: Family

## 2023-01-10 ENCOUNTER — Encounter: Payer: Self-pay | Admitting: Family

## 2023-01-10 ENCOUNTER — Telehealth: Payer: Self-pay

## 2023-01-10 VITALS — BP 142/86 | HR 76 | Temp 97.8°F | Ht 71.0 in | Wt 199.6 lb

## 2023-01-10 DIAGNOSIS — Z7984 Long term (current) use of oral hypoglycemic drugs: Secondary | ICD-10-CM | POA: Diagnosis not present

## 2023-01-10 DIAGNOSIS — E785 Hyperlipidemia, unspecified: Secondary | ICD-10-CM

## 2023-01-10 DIAGNOSIS — I10 Essential (primary) hypertension: Secondary | ICD-10-CM

## 2023-01-10 DIAGNOSIS — R7309 Other abnormal glucose: Secondary | ICD-10-CM

## 2023-01-10 DIAGNOSIS — E119 Type 2 diabetes mellitus without complications: Secondary | ICD-10-CM

## 2023-01-10 LAB — CBC WITH DIFFERENTIAL/PLATELET
Basophils Absolute: 0.1 10*3/uL (ref 0.0–0.1)
Basophils Relative: 0.6 % (ref 0.0–3.0)
Eosinophils Absolute: 0.1 10*3/uL (ref 0.0–0.7)
Eosinophils Relative: 1.2 % (ref 0.0–5.0)
HCT: 53.2 % — ABNORMAL HIGH (ref 39.0–52.0)
Hemoglobin: 17.7 g/dL — ABNORMAL HIGH (ref 13.0–17.0)
Lymphocytes Relative: 15 % (ref 12.0–46.0)
Lymphs Abs: 1.4 10*3/uL (ref 0.7–4.0)
MCHC: 33.3 g/dL (ref 30.0–36.0)
MCV: 97.9 fL (ref 78.0–100.0)
Monocytes Absolute: 0.8 10*3/uL (ref 0.1–1.0)
Monocytes Relative: 8.4 % (ref 3.0–12.0)
Neutro Abs: 7 10*3/uL (ref 1.4–7.7)
Neutrophils Relative %: 74.8 % (ref 43.0–77.0)
Platelets: 251 10*3/uL (ref 150.0–400.0)
RBC: 5.43 Mil/uL (ref 4.22–5.81)
RDW: 13.4 % (ref 11.5–15.5)
WBC: 9.3 10*3/uL (ref 4.0–10.5)

## 2023-01-10 LAB — LIPID PANEL
Cholesterol: 258 mg/dL — ABNORMAL HIGH (ref 0–200)
HDL: 44.9 mg/dL (ref >39.00)
LDL Cholesterol: 183 mg/dL — ABNORMAL HIGH (ref 0–99)
NonHDL: 213.49
Total CHOL/HDL Ratio: 6
Triglycerides: 153 mg/dL — ABNORMAL HIGH (ref 0.0–149.0)
VLDL: 30.6 mg/dL (ref 0.0–40.0)

## 2023-01-10 LAB — COMPREHENSIVE METABOLIC PANEL
ALT: 15 U/L (ref 0–53)
AST: 15 U/L (ref 0–37)
Albumin: 4.4 g/dL (ref 3.5–5.2)
Alkaline Phosphatase: 90 U/L (ref 39–117)
BUN: 15 mg/dL (ref 6–23)
CO2: 25 meq/L (ref 19–32)
Calcium: 9.3 mg/dL (ref 8.4–10.5)
Chloride: 104 meq/L (ref 96–112)
Creatinine, Ser: 1.1 mg/dL (ref 0.40–1.50)
GFR: 70.14 mL/min (ref >60.00)
Glucose, Bld: 162 mg/dL — ABNORMAL HIGH (ref 70–99)
Potassium: 3.8 meq/L (ref 3.5–5.1)
Sodium: 140 meq/L (ref 135–145)
Total Bilirubin: 0.8 mg/dL (ref 0.2–1.2)
Total Protein: 6.9 g/dL (ref 6.0–8.3)

## 2023-01-10 LAB — TSH: TSH: 1.75 u[IU]/mL (ref 0.35–5.50)

## 2023-01-10 LAB — HEMOGLOBIN A1C: Hgb A1c MFr Bld: 7.2 % — ABNORMAL HIGH (ref 4.6–6.5)

## 2023-01-10 MED ORDER — LOSARTAN POTASSIUM 100 MG PO TABS: 100.0000 mg | ORAL_TABLET | Freq: Every day | ORAL | 3 refills | Status: DC

## 2023-01-10 NOTE — Telephone Encounter (Signed)
Pt is scheduled on 12/8.

## 2023-01-10 NOTE — Telephone Encounter (Signed)
Called pt to remind him to come back by office to pick up his Samples of Jardiance (5 boxes) and Eliquis(4 boxes)  due to fact he is in the Donut hole with his insurance. Pt stated that he would come back by to pick them up this afternoon.

## 2023-01-10 NOTE — Assessment & Plan Note (Signed)
Chronic, stable. Continue metformin 1000 mg twice daily, jardiance 10mg  every day. In 2025, after he completes patient assistance, will transition from [place] to [name].

## 2023-01-10 NOTE — Patient Instructions (Addendum)
Blood pressure slightly elevated today.  Increase losartan to 100 mg daily.  Will recheck your labs again in 1 week.  Goal of blood pressure is less than 130/80.  Managing Your Hypertension Hypertension, also called high blood pressure, is when the force of the blood pressing against the walls of the arteries is too strong. Arteries are blood vessels that carry blood from your heart throughout your body. Hypertension forces the heart to work harder to pump blood and may cause the arteries to become narrow or stiff. Understanding blood pressure readings A blood pressure reading includes a higher number over a lower number: The first, or top, number is called the systolic pressure. It is a measure of the pressure in your arteries as your heart beats. The second, or bottom number, is called the diastolic pressure. It is a measure of the pressure in your arteries as the heart relaxes. For most people, a normal blood pressure is below 120/80. Your personal target blood pressure may vary depending on your medical conditions, your age, and other factors. Blood pressure is classified into four stages. Based on your blood pressure reading, your health care provider may use the following stages to determine what type of treatment you need, if any. Systolic pressure and diastolic pressure are measured in a unit called millimeters of mercury (mmHg). Normal Systolic pressure: below 120. Diastolic pressure: below 80. Elevated Systolic pressure: 120-129. Diastolic pressure: below 80. Hypertension stage 1 Systolic pressure: 130-139. Diastolic pressure: 80-89. Hypertension stage 2 Systolic pressure: 140 or above. Diastolic pressure: 90 or above. How can this condition affect me? Managing your hypertension is very important. Over time, hypertension can damage the arteries and decrease blood flow to parts of the body, including the brain, heart, and kidneys. Having untreated or uncontrolled hypertension can lead  to: A heart attack. A stroke. A weakened blood vessel (aneurysm). Heart failure. Kidney damage. Eye damage. Memory and concentration problems. Vascular dementia. What actions can I take to manage this condition? Hypertension can be managed by making lifestyle changes and possibly by taking medicines. Your health care provider will help you make a plan to bring your blood pressure within a normal range. You may be referred for counseling on a healthy diet and physical activity. Nutrition  Eat a diet that is high in fiber and potassium, and low in salt (sodium), added sugar, and fat. An example eating plan is called the DASH diet. DASH stands for Dietary Approaches to Stop Hypertension. To eat this way: Eat plenty of fresh fruits and vegetables. Try to fill one-half of your plate at each meal with fruits and vegetables. Eat whole grains, such as whole-wheat pasta, brown rice, or whole-grain bread. Fill about one-fourth of your plate with whole grains. Eat low-fat dairy products. Avoid fatty cuts of meat, processed or cured meats, and poultry with skin. Fill about one-fourth of your plate with lean proteins such as fish, chicken without skin, beans, eggs, and tofu. Avoid pre-made and processed foods. These tend to be higher in sodium, added sugar, and fat. Reduce your daily sodium intake. Many people with hypertension should eat less than 1,500 mg of sodium a day. Lifestyle  Work with your health care provider to maintain a healthy body weight or to lose weight. Ask what an ideal weight is for you. Get at least 30 minutes of exercise that causes your heart to beat faster (aerobic exercise) most days of the week. Activities may include walking, swimming, or biking. Include exercise to strengthen your  muscles (resistance exercise), such as weight lifting, as part of your weekly exercise routine. Try to do these types of exercises for 30 minutes at least 3 days a week. Do not use any products that  contain nicotine or tobacco. These products include cigarettes, chewing tobacco, and vaping devices, such as e-cigarettes. If you need help quitting, ask your health care provider. Control any long-term (chronic) conditions you have, such as high cholesterol or diabetes. Identify your sources of stress and find ways to manage stress. This may include meditation, deep breathing, or making time for fun activities. Alcohol use Do not drink alcohol if: Your health care provider tells you not to drink. You are pregnant, may be pregnant, or are planning to become pregnant. If you drink alcohol: Limit how much you have to: 0-1 drink a day for women. 0-2 drinks a day for men. Know how much alcohol is in your drink. In the [place] one drink equals one 12 oz bottle of beer (355 mL), one 5 oz glass of wine (148 mL), or one 1 oz glass of hard liquor (44 mL). Medicines Your health care provider may prescribe medicine if lifestyle changes are not enough to get your blood pressure under control and if: Your systolic blood pressure is 130 or higher. Your diastolic blood pressure is 80 or higher. Take medicines only as told by your health care provider. Follow the directions carefully. Blood pressure medicines must be taken as told by your health care provider. The medicine does not work as well when you skip doses. Skipping doses also puts you at risk for problems. Monitoring Before you monitor your blood pressure: Do not smoke, drink caffeinated beverages, or exercise within 30 minutes before taking a measurement. Use the bathroom and empty your bladder (urinate). Sit quietly for at least 5 minutes before taking measurements. Monitor your blood pressure at home as told by your health care provider. To do this: Sit with your back straight and supported. Place your feet flat on the floor. Do not cross your legs. Support your arm on a flat surface, such as a table. Make sure your upper arm is at heart  level. Each time you measure, take two or three readings one minute apart and record the results. You may also need to have your blood pressure checked regularly by your health care provider. General information Talk with your health care provider about your diet, exercise habits, and other lifestyle factors that may be contributing to hypertension. Review all the medicines you take with your health care provider because there may be side effects or interactions. Keep all follow-up visits. Your health care provider can help you create and adjust your plan for managing your high blood pressure. Where to find more information National Heart, Lung, and Blood Institute: PopSteam.is American Heart Association: www.heart.org Contact a health care provider if: You think you are having a reaction to medicines you have taken. You have repeated (recurrent) headaches. You feel dizzy. You have swelling in your ankles. You have trouble with your vision. Get help right away if: You develop a severe headache or confusion. You have unusual weakness or numbness, or you feel faint. You have severe pain in your chest or abdomen. You vomit repeatedly. You have trouble breathing. These symptoms may be an emergency. Get help right away. Call 911. Do not wait to see if the symptoms will go away. Do not drive yourself to the hospital. Summary Hypertension is when the force of blood pumping through your  arteries is too strong. If this condition is not controlled, it may put you at risk for serious complications. Your personal target blood pressure may vary depending on your medical conditions, your age, and other factors. For most people, a normal blood pressure is less than 120/80. Hypertension is managed by lifestyle changes, medicines, or both. Lifestyle changes to help manage hypertension include losing weight, eating a healthy, low-sodium diet, exercising more, stopping smoking, and limiting  alcohol. This information is not intended to replace advice given to you by your health care provider. Make sure you discuss any questions you have with your health care provider. Document Revised: 10/16/2020 Document Reviewed: 10/16/2020 Elsevier Patient Education  2024 [employer].

## 2023-01-10 NOTE — Progress Notes (Signed)
Assessment & Plan:  Essential hypertension Assessment & Plan: Suboptimal control . advised to increase losartan to 100 mg daily.  Continue Amiodarone 200 mg daily,  Toprol 100 mg BID .  Advised blood pressure goal less than 130/80.   Orders: -     Hemoglobin A1c -     CBC with Differential/Platelet -     Comprehensive metabolic panel -     TSH -     Losartan Potassium; Take 1 tablet (100 mg total) by mouth daily.  Dispense: 90 tablet; Refill: 3 -     Basic metabolic panel; Future  Hyperlipidemia, unspecified hyperlipidemia type -     Lipid panel  Type 2 diabetes mellitus without complication, without long-term current use of insulin (HCC) Assessment & Plan: Chronic, stable. Continue metformin 1000 mg twice daily, jardiance 10mg  every day. In 2025, after he completes patient assistance, will transition from [place] to [name].   Orders: -     Microalbumin / creatinine urine ratio  Elevated glucose -     Hemoglobin A1c     Return precautions given.   Risks, benefits, and alternatives of the medications and treatment plan prescribed today were discussed, and patient expressed understanding.   Education regarding symptom management and diagnosis given to patient on AVS either electronically or printed.  Return in about 3 months (around 04/12/2023).  Rennie Plowman, FNP  Subjective:    Patient ID: Jeffrey Ortiz, male    DOB: 12/03/1955, 67 y.o.   MRN: [id number]  CC: Jeffrey Ortiz is a 67 y.o. male who presents today for follow up.   HPI: Feels well today.  No new complaints   compliant with losartan 75 every day, toprol 100mg   He uses laxix 40mg  prn.   He is not taking blood pressure at home.    Participating in patient assistance with [name] once completed.   He is on jardiance 10mg    He remains compliant with Eliquis 5 mg twice daily.  Denies bleeding.   Eye appointment is scheduled   Allergies: Patient has no known allergies. Current Outpatient  Medications on File Prior to Visit  Medication Sig Dispense Refill   amiodarone (PACERONE) 200 MG tablet Take 1 tablet (200 mg total) by mouth daily. 90 tablet 1   apixaban (ELIQUIS) 5 MG TABS tablet Take 1 tablet (5 mg total) by mouth 2 (two) times daily. (Patient not taking: Reported on 11/19/2022) 180 tablet 3   blood glucose meter kit and supplies KIT Use to check blood sugar once daily Dispense based on patient and insurance preference. 1 each 0   empagliflozin (JARDIANCE) 10 MG TABS tablet Take 1 tablet (10 mg total) by mouth daily. (Patient not taking: Reported on 11/19/2022) 30 tablet 6   furosemide (LASIX) 40 MG tablet Take 1 tablet (40 mg total) by mouth daily. Due yearly visit.  PLEASE CALL OFFICE TO SCHEDULE APPOINTMENT PRIOR TO NEXT REFILL (first attempt) 30 tablet 0   metFORMIN (GLUCOPHAGE) 500 MG tablet Take 2 tablets (1,000 mg total) by mouth 2 (two) times daily. 360 tablet 1   metoprolol succinate (TOPROL-XL) 100 MG 24 hr tablet Take 1 tablet by mouth twice daily 60 tablet 0   metoprolol tartrate (LOPRESSOR) 25 MG tablet TAKE 1 TABLET BY MOUTH ONCE DAILY AS NEEDED (TAKE  FOR  HEART  RATE  GREATER  THAN  100) 90 tablet 0   rosuvastatin (CRESTOR) 10 MG tablet Take 1 tablet (10 mg total) by mouth every other day. 90 tablet 0  tamsulosin (FLOMAX) 0.4 MG CAPS capsule Take 1 capsule (0.4 mg total) by mouth daily. 90 capsule 3   No current facility-administered medications on file prior to visit.    Review of Systems  Constitutional:  Negative for chills and fever.  Respiratory:  Negative for cough.   Cardiovascular:  Negative for chest pain and palpitations.  Gastrointestinal:  Negative for nausea and vomiting.      Objective:    BP (!) 142/86   Pulse 76   Temp 97.8 F (36.6 C) (Oral)   Ht 5\' 11"  (1.803 m)   Wt 199 lb 9.6 oz (90.5 kg)   SpO2 98%   BMI 27.84 kg/m  BP Readings from Last 3 Encounters:  01/10/23 (!) 142/86  06/09/22 (!) 142/88  04/26/22 (!) 150/80   Wt  Readings from Last 3 Encounters:  01/10/23 199 lb 9.6 oz (90.5 kg)  11/19/22 200 lb (90.7 kg)  06/09/22 205 lb (93 kg)    Physical Exam Vitals reviewed.  Constitutional:      Appearance: He is well-developed.  Cardiovascular:     Rate and Rhythm: Regular rhythm.     Heart sounds: Normal heart sounds.  Pulmonary:     Effort: Pulmonary effort is normal. No respiratory distress.     Breath sounds: Normal breath sounds. No wheezing, rhonchi or rales.  Skin:    General: Skin is warm and dry.  Neurological:     Mental Status: He is alert.  Psychiatric:        Speech: Speech normal.        Behavior: Behavior normal.

## 2023-01-10 NOTE — Assessment & Plan Note (Signed)
Suboptimal control . advised to increase losartan to 100 mg daily.  Continue Amiodarone 200 mg daily,  Toprol 100 mg BID .  Advised blood pressure goal less than 130/80.

## 2023-01-11 LAB — MICROALBUMIN / CREATININE URINE RATIO
Creatinine,U: 64.8 mg/dL
Microalb Creat Ratio: 1.2 mg/g (ref 0.0–30.0)
Microalb, Ur: 0.8 mg/dL (ref 0.0–1.9)

## 2023-01-25 ENCOUNTER — Telehealth: Payer: Self-pay

## 2023-01-25 NOTE — Telephone Encounter (Signed)
Spoke to pt and he stated he needed to call me back to go over results

## 2023-01-31 ENCOUNTER — Encounter: Payer: Self-pay | Admitting: Pharmacist

## 2023-02-15 ENCOUNTER — Encounter: Payer: Self-pay | Admitting: Pharmacist

## 2023-02-15 NOTE — Progress Notes (Unsigned)
 Manufacturer Assistance Program (MAP) Application   Manufacturer: [employer] ([employer])    (New enrollment) Medication(s): Farxiga  10 mg  Patient Portion of Application:  10/31: Mailed to patient home by CPhT Advocate team 12/31: Filled out and placed in front office for patient signature.  Income Documentation: N/A - Electronic verification elected.  Provider Portion of Application:  12/31: Provider portion placed in PCP inbox for signature. Prescription(s): Included in application    Application Status: Not submitted (pending signatures)  Next Steps: [x]    PCP signature (02/15/23) []    Patient signature  (patient reports he will stop by office to sign at front desk Thur/Fri or next Mon) []    Upon patient signature(s) Application to be faxed to [employer] Fax: [phone] AND scanned into patient chart []    Fax confirmation of approval received  Forwarded to [facility] CPhT Patient Advocate Team who has been working with patient for his application.  Note routed to PCP Clinic Pool to ensure PCP signature is obtained and application is faxed.  *[facility] clinic team - Please Addend/update this note as the Next Steps are completed in office*

## 2023-02-17 ENCOUNTER — Telehealth: Payer: Self-pay

## 2023-02-17 MED ORDER — DAPAGLIFLOZIN PROPANEDIOL 10 MG PO TABS: 10.0000 mg | ORAL_TABLET | Freq: Every day | ORAL | 3 refills | Status: DC

## 2023-02-17 MED ORDER — ROSUVASTATIN CALCIUM 20 MG PO TABS: 20.0000 mg | ORAL_TABLET | Freq: Every day | ORAL | 3 refills | Status: DC

## 2023-02-17 NOTE — Telephone Encounter (Signed)
 Copied from CRM [id number]. Topic: Clinical - Prescription Issue >> Feb 17, 2023  1:31 PM Jeffrey Ortiz wrote: Reason for CRM: Patient stated he received message about prescriptions being ready at the [facility] pharmacy. [facility] stated [facility] [address] (unsure of exact address but says it is close by) and would like those medications sent there instead. [facility].

## 2023-02-17 NOTE — Addendum Note (Signed)
 Addended by: [place], Maclean Foister on: 02/17/2023 03:14 PM   Modules accepted: Orders

## 2023-02-17 NOTE — Telephone Encounter (Signed)
 Spoke to pt informed him that [facility]'s [facility] [address] and pharmacy has been updated in his chart

## 2023-03-02 ENCOUNTER — Telehealth: Payer: Self-pay

## 2023-03-02 NOTE — Telephone Encounter (Signed)
 Copied from CRM [id number]. Topic: Clinical - Prescription Issue >> Mar 02, 2023 11:06 AM Howard Macho wrote: Reason for CRM: Pt called stated he would like to speak to the cma about his farxiga  medication but he can not afford the medication [phone]

## 2023-03-02 NOTE — Telephone Encounter (Signed)
 Spoke to pt and informed him that he needs to come by to sign form for pt assistance to see if he can qualify to get assistance with the Farxiga  . Pt stated that he will come by on Thurs or Fri to sign form, pt also mentioned if we had any samples of Jardiance  that we could give him he only has 4 pills left

## 2023-03-03 NOTE — Telephone Encounter (Signed)
Lvm to inform pt that we do have samples of Jardiance that we cn give him in office. I have placed samples (6 boxes) up front in cabinet for pt to pick up when he comes in to sign form for pt assistance

## 2023-03-04 NOTE — Telephone Encounter (Signed)
Spoke to pt and he stated that he came and signed form for pt assistance on 03/03/23 he stated that he will come by on Mon 03/07/23 to pick up the Jardiance samples (10 mg 6 boxes) I placed upfront for him

## 2023-03-08 ENCOUNTER — Other Ambulatory Visit: Payer: Self-pay | Admitting: Family

## 2023-03-08 ENCOUNTER — Telehealth: Payer: Self-pay

## 2023-03-08 MED ORDER — METFORMIN HCL 500 MG PO TABS: 1000.0000 mg | ORAL_TABLET | Freq: Two times a day (BID) | ORAL | 1 refills | Status: DC

## 2023-03-08 MED ORDER — METOPROLOL SUCCINATE ER 100 MG PO TB24: 100.0000 mg | ORAL_TABLET | Freq: Two times a day (BID) | ORAL | 1 refills | Status: DC

## 2023-03-08 NOTE — Addendum Note (Signed)
Addended by: Sandy Salaam on: 03/08/2023 04:24 PM   Modules accepted: Orders

## 2023-03-08 NOTE — Telephone Encounter (Signed)
Copied from CRM #[id number]. Topic: Clinical - Medication Refill >> Mar 08, 2023 10:17 AM Corin V wrote: Most Recent Primary Care Visit:  Provider: Allegra Grana  Department: [facility]  Visit Type: OFFICE VISIT  Date: 01/10/2023  Medication: metoprolol succinate (TOPROL-XL) 100 MG 24 hr tablet metFORMIN (GLUCOPHAGE) 500 MG tablet  Has the patient contacted their pharmacy? Yes (Agent: If no, request that the patient contact the pharmacy for the refill. If patient does not wish to contact the pharmacy document the reason why and proceed with request.) (Agent: If yes, when and what did the pharmacy advise?)  Is this the correct pharmacy for this prescription? Yes If no, delete pharmacy and type the correct one.  This is the patient's preferred pharmacy:  [id number] - [address] [place] [address] Phone: [phone] Fax: [phone]   Has the prescription been filled recently? No  Is the patient out of the medication? Yes- patient runs out today  Has the patient been seen for an appointment in the last year OR does the patient have an upcoming appointment? Yes  Can we respond through MyChart? No  Agent: Please be advised that Rx refills may take up to 3 business days. We ask that you follow-up with your pharmacy.

## 2023-03-08 NOTE — Telephone Encounter (Signed)
Spoke with pt and he stated that needed a refill for the metformin and metoprolol 100 mg. I have refilled both of them.

## 2023-03-08 NOTE — Telephone Encounter (Signed)
Copied from CRM [id number]. Topic: General - Other >> Mar 08, 2023 10:22 AM Corin V wrote: Reason for CRM: Patient is requesting a call back from [facility] Arnett's nurse.

## 2023-03-14 NOTE — Progress Notes (Deleted)
 Patient ID: Jeffrey Ortiz, male   DOB: 03-18-1955, 68 y.o.   MRN: [id number] Cardiology Office Note  Date:  03/14/2023   ID:  Jeffrey Ortiz, DOB September 18, 1955, MRN [id number]  PCP:  Allegra Grana, FNP   No chief complaint on file.   HPI:  [name] is a 68 yo-old gentleman with a history of  GERD,  diabetes, hypertension, hyperlipidemia,  hospital 08/11/2015 with worsening shortness of breath, orthopnea, PND after severe GERD, vomiting, aspiration into his sinuses noted to be in atrial flutter with RVR underwent TEE with cardioversion Cardioversion was initially successful though converted to atrial fibrillation Later back on the floor he went back into atrial flutter Atrial flutter-recurrent-typical  S/p ablation  Last seen in clinic 2017 Cardiomyopathy ejection fraction 25%-30% March 2023 while in atrial fibrillation He presents for follow-up of his atrial fibrillation, persistent  LOV November 2023  Seen by EP March 2023 Started on amiodarone Underwent cardioversion,  Ablation 10/06/21  Ablation reports that he has been feeling well, denies any tachypalpitations concerning for breakthrough arrhythmia Denies leg swelling, no PND orthopnea Reports blood pressure has been running high  Prior cardiac testing reviewed Echocardiogram 06/19/21 Ejection fraction has improved up to 45 to 50%, previously was 25 to 30%  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  PSH:    Past Surgical History:  Procedure Laterality Date   ATRIAL FIBRILLATION ABLATION N/A 10/05/2021   Procedure: ATRIAL  FIBRILLATION ABLATION;  Surgeon: Lanier Prude, MD;  Location: [facility] INVASIVE CV LAB;  Service: Cardiovascular;  Laterality: N/A;   COLONOSCOPY WITH PROPOFOL N/A 08/10/2018   Procedure: COLONOSCOPY WITH PROPOFOL;  Surgeon: Pasty Spillers, MD;  Location: [facility] ENDOSCOPY;  Service: Endoscopy;  Laterality: N/A;   ELECTROPHYSIOLOGIC STUDY N/A 09/26/2015   Procedure: A-Flutter;  Surgeon: Duke Salvia, MD;  Location: [facility] INVASIVE CV LAB;  Service: Cardiovascular;  Laterality: N/A;   FLEXIBLE SIGMOIDOSCOPY N/A 04/14/2022   Procedure: FLEXIBLE SIGMOIDOSCOPY;  Surgeon: Imogene Burn, MD;  Location: [facility] ENDOSCOPY;  Service: Gastroenterology;  Laterality: N/A;   HEMOSTASIS CONTROL  04/14/2022   Procedure: HEMOSTASIS CONTROL;  Surgeon: Imogene Burn, MD;  Location: [facility] ENDOSCOPY;  Service: Gastroenterology;;  pure stat and hemospray   IR ANGIOGRAM SELECTIVE EACH ADDITIONAL VESSEL  04/14/2022   IR ANGIOGRAM SELECTIVE EACH ADDITIONAL VESSEL  04/14/2022   IR ANGIOGRAM SELECTIVE EACH ADDITIONAL VESSEL  04/14/2022   IR ANGIOGRAM VISCERAL SELECTIVE  04/14/2022   IR EMBO ART  VEN HEMORR LYMPH EXTRAV  INC GUIDE ROADMAPPING  04/14/2022   IR FLUORO GUIDE CV LINE RIGHT  04/14/2022   IR US GUIDE VASC ACCESS RIGHT  04/14/2022   IR US GUIDE VASC ACCESS RIGHT  04/14/2022   TEE WITHOUT CARDIOVERSION N/A 08/13/2015   Procedure: TRANSESOPHAGEAL ECHOCARDIOGRAM (TEE) and cardioversion;  Surgeon: Antonieta Iba, MD;  Location: [facility] ORS;  Service: Cardiovascular;  Laterality: N/A;    Current Outpatient Medications  Medication Sig Dispense Refill   amiodarone (PACERONE) 200 MG tablet Take 1 tablet (200 mg total) by mouth daily. 90 tablet 1   apixaban (ELIQUIS) 5 MG TABS tablet Take 1 tablet (5 mg total)  by mouth 2 (two) times daily. (Patient not taking: Reported on 11/19/2022) 180 tablet 3   blood glucose meter kit and supplies KIT Use to check blood sugar once daily Dispense based on patient and insurance preference. 1 each 0    dapagliflozin propanediol (FARXIGA) 10 MG TABS tablet Take 1 tablet (10 mg total) by mouth daily before breakfast. 30 tablet 3   furosemide (LASIX) 40 MG tablet Take 1 tablet (40 mg total) by mouth daily. Due yearly visit.  PLEASE CALL OFFICE TO SCHEDULE APPOINTMENT PRIOR TO NEXT REFILL (first attempt) 30 tablet 0   losartan (COZAAR) 100 MG tablet Take 1 tablet (100 mg total) by mouth daily. 90 tablet 3   metFORMIN (GLUCOPHAGE) 500 MG tablet Take 2 tablets (1,000 mg total) by mouth 2 (two) times daily. 360 tablet 1   metoprolol succinate (TOPROL-XL) 100 MG 24 hr tablet Take 1 tablet (100 mg total) by mouth 2 (two) times daily. 180 tablet 1   metoprolol tartrate (LOPRESSOR) 25 MG tablet TAKE 1 TABLET BY MOUTH ONCE DAILY AS NEEDED (TAKE  FOR  HEART  RATE  GREATER  THAN  100) 90 tablet 0   rosuvastatin (CRESTOR) 20 MG tablet Take 1 tablet (20 mg total) by mouth daily. 90 tablet 3   tamsulosin (FLOMAX) 0.4 MG CAPS capsule Take 1 capsule (0.4 mg total) by mouth daily. 90 capsule 3   No current facility-administered medications for this visit.     Allergies:   Patient has no known allergies.   Social History:  The patient  reports that he has never smoked. He has never been exposed to tobacco smoke. He has never used smokeless tobacco. He reports that he does not drink alcohol and does not use drugs.   Family History:   family history includes Cancer in his father; Diabetes in his brother; Healthy (age of onset: 27) in his mother; Heart attack in his brother and father.    Review of Systems: Review of Systems  Constitutional: Negative.   HENT: Negative.    Respiratory: Negative.    Cardiovascular: Negative.   Gastrointestinal: Negative.   Musculoskeletal: Negative.   Neurological: Negative.   Psychiatric/Behavioral: Negative.    All other systems reviewed and are negative.   PHYSICAL EXAM: VS:  There were no vitals taken for this visit. , BMI There is no height or weight on file to  calculate BMI. Constitutional:  oriented to person, place, and time. No distress.  HENT:  Head: Grossly normal Eyes:  no discharge. No scleral icterus.  Neck: No JVD, no carotid bruits  Cardiovascular: Regular rate and rhythm, no murmurs appreciated Pulmonary/Chest: Clear to auscultation bilaterally, no wheezes or rails Abdominal: Soft.  no distension.  no tenderness.  Musculoskeletal: Normal range of motion Neurological:  normal muscle tone. Coordination normal. No atrophy Skin: Skin warm and dry Psychiatric: normal affect, pleasant  Recent Labs: 01/10/2023: ALT 15; BUN 15; Creatinine, Ser 1.10; Hemoglobin 17.7; Platelets 251.0; Potassium 3.8; Sodium 140; TSH 1.75    Lipid Panel Lab Results  Component Value Date   CHOL 258 (H) 01/10/2023   HDL 44.90 01/10/2023   LDLCALC 183 (H) 01/10/2023   TRIG 153.0 (H) 01/10/2023      Wt Readings from Last 3 Encounters:  01/10/23 199 lb 9.6 oz (90.5 kg)  11/19/22 200 lb (90.7 kg)  06/09/22 205 lb (93 kg)     ASSESSMENT AND PLAN:  Atrial flutter with rapid ventricular response (HCC)  Prior ablation On metoprolol, amiodarone, Eliquis  Maintaining normal sinus rhythm  Persistent atrial fibrillation Recent ablation August 2023 Recommended continue amiodarone metoprolol Eliquis, discussed any medication changes with Dr. Lalla Brothers  Essential hypertension Recommend he add losartan 50 daily  Cardiomyopathy Ejection fraction has improved up to 45 to 50%, previously was 25 to 30% Continue Farxiga, metoprolol succinate We will add losartan 50 daily Could consider Entresto   Total encounter time more than 30  minutes  Greater than 50% was spent in counseling and coordination of care with the patient   No orders of the defined types were placed in this encounter.    Signed, Dossie Arbour, M.D., Ph.D. 03/14/2023  [facility] [place], [place] [phone]

## 2023-03-15 ENCOUNTER — Encounter: Payer: Self-pay | Admitting: Pharmacist

## 2023-03-15 ENCOUNTER — Ambulatory Visit: Payer: Medicare Other | Admitting: Cardiovascular Disease

## 2023-03-15 DIAGNOSIS — D6869 Other thrombophilia: Secondary | ICD-10-CM

## 2023-03-15 DIAGNOSIS — I480 Paroxysmal atrial fibrillation: Secondary | ICD-10-CM

## 2023-03-15 DIAGNOSIS — I4892 Unspecified atrial flutter: Secondary | ICD-10-CM

## 2023-03-15 DIAGNOSIS — I5022 Chronic systolic (congestive) heart failure: Secondary | ICD-10-CM

## 2023-03-15 DIAGNOSIS — I429 Cardiomyopathy, unspecified: Secondary | ICD-10-CM

## 2023-03-15 DIAGNOSIS — I10 Essential (primary) hypertension: Secondary | ICD-10-CM

## 2023-03-15 DIAGNOSIS — E119 Type 2 diabetes mellitus without complications: Secondary | ICD-10-CM

## 2023-03-15 NOTE — Progress Notes (Unsigned)
Manufacturer Assistance Program (MAP) Application   Manufacturer: [employer] ([employer])    (New enrollment) Medication(s): farxiga (replaces Jardiance)  Application Status:  Approved F/u with [employer] on 03/15/23 Application approved 1/17 [id number]_[id number] Approved through 02/15/2024 Re-enrollment eligibility date 11/17/2023  Last medication shipment: 03/09/23

## 2023-03-16 ENCOUNTER — Telehealth: Payer: Self-pay

## 2023-03-16 NOTE — Progress Notes (Signed)
LVM to call back to inform him of the following:       Fyi  -- approved/med shipped. [employer] [name] (replaced Jardiance)   Loree Fee, [place] hours ago (1:02 PM)     Adult nurse Program (MAP) Application   Manufacturer: [employer] (AZ[employer]Me)    (New enrollment) Medication(s): farxiga (replaces Jardiance)   Application Status:  Approved F/u with [employer] on 03/15/23 Application approved 1/17 [id number]_[id number] Approved through 02/15/2024 Re-enrollment eligibility date 11/17/2023   Last medication shipment: 03/09/23

## 2023-03-16 NOTE — Telephone Encounter (Signed)
Copied from CRM [id number]. Topic: General - Other >> Mar 16, 2023 12:29 PM Fredrich Romans wrote: Reason for CRM: patient returned call to jenate,he would like a return call back

## 2023-03-17 NOTE — Telephone Encounter (Signed)
PAP: Patient assistance application for Jeffrey Ortiz has been approved by PAP Companies: [employer] from 11/2022 to 02/15/2024. Medication should be delivered to PAP Delivery: Home. For further shipping updates, please contact [employer] ([employer]) at [phone]. Patient ID is: no id   PLEASE BE ADVISED

## 2023-03-17 NOTE — Telephone Encounter (Signed)
Will you call to ensure patient doesn't need anything from [place]?

## 2023-03-21 NOTE — Progress Notes (Signed)
Pt is aware and has received his [name] in the mail

## 2023-03-21 NOTE — Telephone Encounter (Signed)
Spoke to pt he did pt need anything else he has received his pt assistance medication Farxiga.

## 2023-04-04 ENCOUNTER — Other Ambulatory Visit: Payer: Medicare Other

## 2023-04-08 ENCOUNTER — Telehealth: Payer: Self-pay

## 2023-04-08 NOTE — Telephone Encounter (Signed)
 Copied from CRM [id number]. Topic: Clinical - Prescription Issue >> Apr 08, 2023  1:58 PM [place] A wrote: Reason for CRM: Patient called to leave message with nurse that he is almost out of apixaban (ELIQUIS) 5 MG TABS tablet and would like for the medication that was discussed with Primary to be prescribed because it is cheaper

## 2023-04-11 ENCOUNTER — Telehealth: Payer: Self-pay

## 2023-04-11 ENCOUNTER — Other Ambulatory Visit (INDEPENDENT_AMBULATORY_CARE_PROVIDER_SITE_OTHER): Payer: Medicare Other

## 2023-04-11 DIAGNOSIS — E785 Hyperlipidemia, unspecified: Secondary | ICD-10-CM | POA: Diagnosis not present

## 2023-04-11 DIAGNOSIS — I10 Essential (primary) hypertension: Secondary | ICD-10-CM

## 2023-04-11 DIAGNOSIS — N401 Enlarged prostate with lower urinary tract symptoms: Secondary | ICD-10-CM

## 2023-04-11 MED ORDER — TAMSULOSIN HCL 0.4 MG PO CAPS: 0.4000 mg | ORAL_CAPSULE | Freq: Every day | ORAL | 0 refills | Status: DC

## 2023-04-11 NOTE — Telephone Encounter (Signed)
 Lvm to call back to inform pt that the Metoprolol was sent in on 03/12/23 and this is 1st request for  Tamsulosin we have received but [facility] [address]

## 2023-04-11 NOTE — Telephone Encounter (Signed)
 Call pt Metoprolol refilled last month   Flomax is prescribed by urology. I have sent courtesy refill; further refills need to come from urology

## 2023-04-11 NOTE — Telephone Encounter (Signed)
 Copied from CRM [id number]. Topic: Clinical - Prescription Issue >> Apr 11, 2023 10:40 AM [place] E wrote: Reason for CRM: Patient called in regarding 2 of his medications: tamsulosin (FLOMAX) 0.4 MG CAPS capsule and his metoprolol succinate (TOPROL-XL) 100 MG 24 hr tablet. Stated how he submitted a refill for these medications 2 weeks ago and that the Pharmacy still does not have it. He did [facility] [address] [facility] in his chart. Callback number for patient is [phone] for confirmation on these medications.

## 2023-04-11 NOTE — Addendum Note (Signed)
 Addended by: Allegra Grana on: 04/11/2023 08:24 PM   Modules accepted: Orders

## 2023-04-12 ENCOUNTER — Other Ambulatory Visit (HOSPITAL_COMMUNITY): Payer: Self-pay

## 2023-04-12 ENCOUNTER — Other Ambulatory Visit: Payer: Self-pay | Admitting: Family

## 2023-04-12 ENCOUNTER — Encounter: Payer: Self-pay | Admitting: Family

## 2023-04-12 ENCOUNTER — Telehealth: Payer: Self-pay

## 2023-04-12 ENCOUNTER — Other Ambulatory Visit: Payer: Self-pay | Admitting: Pharmacist

## 2023-04-12 DIAGNOSIS — I4892 Unspecified atrial flutter: Secondary | ICD-10-CM

## 2023-04-12 DIAGNOSIS — I480 Paroxysmal atrial fibrillation: Secondary | ICD-10-CM

## 2023-04-12 LAB — LIPID PANEL
Cholesterol: 197 mg/dL (ref 0–200)
HDL: 49.4 mg/dL (ref >39.00)
LDL Cholesterol: 122 mg/dL — ABNORMAL HIGH (ref 0–99)
NonHDL: 148.08
Total CHOL/HDL Ratio: 4
Triglycerides: 130 mg/dL (ref 0.0–149.0)
VLDL: 26 mg/dL (ref 0.0–40.0)

## 2023-04-12 LAB — COMPREHENSIVE METABOLIC PANEL
ALT: 16 U/L (ref 0–53)
AST: 17 U/L (ref 0–37)
Albumin: 4.1 g/dL (ref 3.5–5.2)
Alkaline Phosphatase: 69 U/L (ref 39–117)
BUN: 12 mg/dL (ref 6–23)
CO2: 26 meq/L (ref 19–32)
Calcium: 8.7 mg/dL (ref 8.4–10.5)
Chloride: 104 meq/L (ref 96–112)
Creatinine, Ser: 1.05 mg/dL (ref 0.40–1.50)
GFR: 74.03 mL/min (ref >60.00)
Glucose, Bld: 93 mg/dL (ref 70–99)
Potassium: 4.2 meq/L (ref 3.5–5.1)
Sodium: 141 meq/L (ref 135–145)
Total Bilirubin: 0.5 mg/dL (ref 0.2–1.2)
Total Protein: 6.6 g/dL (ref 6.0–8.3)

## 2023-04-12 MED ORDER — METOPROLOL TARTRATE 25 MG PO TABS: 25.0000 mg | ORAL_TABLET | Freq: Every day | ORAL | 0 refills | Status: DC | PRN

## 2023-04-12 NOTE — Telephone Encounter (Signed)
 Copied from CRM [id number]. Topic: Clinical - Prescription Issue >> Apr 12, 2023  8:21 AM Isabell A wrote: Reason for CRM: Patient states when his medications were sent all were not sent, only half.  Patient states he takes two different metoprolol tartrate, only the 100mg  were sent not 25mg .   Patient states his FLOMAX should've been sent by [place] not urologist.   Patient complains he should get his refills the same day when he is running out, states he called in and didn't get his refill the same day.

## 2023-04-12 NOTE — Telephone Encounter (Signed)
 Spoke to pt explained message below to him. Pt verbalized understanding and scheduled dm f/up for 05/16/23

## 2023-04-12 NOTE — Telephone Encounter (Signed)
 LVM to inform pt of below message. Please just relay message when pt calls back. Per Claris Che  Metoprolol refilled last month     Flomax is prescribed by urology. I have sent courtesy refill; further refills need to come from urology

## 2023-04-12 NOTE — Telephone Encounter (Signed)
-----   Message from Loree Fee sent at 04/12/2023  1:40 PM EST ----- Can we please run a test claim for Eliquis 5 mg BID x30 days for this patient? My Epic is not pulling updated Rx insurance info.

## 2023-04-12 NOTE — Telephone Encounter (Signed)
 Pharmacy Patient Advocate Encounter  Insurance verification completed.   The patient is insured through [employer] MEDICARE D [phone]) (MEDDPRIME   Ran test claim for ELIQUIS 5MG . Currently a quantity of 60 is a 30 day supply and the co-pay is 135.50.  This test claim was processed through [facility]- copay amounts may vary at other pharmacies due to pharmacy/plan contracts, or as the patient moves through the different stages of their insurance plan.

## 2023-04-12 NOTE — Progress Notes (Signed)
 At end of the year, patient fell into Medicare Donut hole, increasing the cost of his Eliquis.   Test claim requested for Eliquis 5 mg BID x1 month supply (as no updated Rx insurance card available in Epic).    Payor: MEDICARE / Plan: MEDICARE PART A AND B / Product Type: *No Product type* /  Rx Coverage: RX [employer] MEDICARE D ([id number]) (MEDDPRIME)  2025 Rx Formulary: [email or web address] Tier 2: Eliquis, Xarelto  Tier 1: Warfarin, dabigatran

## 2023-04-13 ENCOUNTER — Encounter: Payer: Self-pay | Admitting: *Deleted

## 2023-04-14 ENCOUNTER — Other Ambulatory Visit (HOSPITAL_COMMUNITY): Payer: Self-pay

## 2023-04-14 NOTE — Telephone Encounter (Signed)
 Pt notified. Samples provided. Medication Samples have been provided to the patient.  Drug name: eliquis       Strength: 5mg         Qty: 56(4 boxes)  LOT: [id number]  Exp.Date: 02/2024  Dosing instructions: Take 5 mg bid  The patient has been instructed regarding the correct time, dose, and frequency of taking this medication, including desired effects and most common side effects.   Kristie Cowman 3:49 PM 04/14/2023

## 2023-04-15 ENCOUNTER — Encounter: Payer: Self-pay | Admitting: Pharmacist

## 2023-04-18 NOTE — Telephone Encounter (Signed)
 Pt is aware.

## 2023-05-16 ENCOUNTER — Ambulatory Visit: Payer: Medicare Other | Admitting: Family

## 2023-05-26 ENCOUNTER — Encounter: Payer: Self-pay | Admitting: Pharmacist

## 2023-05-26 NOTE — Progress Notes (Signed)
 Attempted to contact patient to follow up on Eliquis medication cost.   Previous outreach attempts to discuss Eliquis cost. Continues to request samples from clinic.  If Eliquis remains unaffordable, previously advised patient we can consider other options that may have cheaper price through his insurance.   Left HIPAA compliant message for patient to return my call at their convenience.  Left direct callback number.      -.-.-.-.-.-.-.-.-.-.-.-.-.-.-.-.-.-.-.-.-.-.-.-.-.-.-.-.-.-.-.-.-.-.-.-.-.-    2025 Formulary: [email or web address]-[email or web address]   Tier 2: Eliquis, Xarelto  Tier 1: Warfarin, dabigatran   Test Claim continues to show: - $590 deductible before Eliquis cost will go back down.

## 2023-06-02 ENCOUNTER — Other Ambulatory Visit: Payer: Self-pay | Admitting: Cardiovascular Disease

## 2023-06-02 ENCOUNTER — Other Ambulatory Visit: Payer: Self-pay

## 2023-06-03 ENCOUNTER — Other Ambulatory Visit: Payer: Medicare Other

## 2023-06-06 ENCOUNTER — Other Ambulatory Visit: Payer: PRIVATE HEALTH INSURANCE

## 2023-06-06 ENCOUNTER — Telehealth: Payer: Self-pay | Admitting: Urology

## 2023-06-06 DIAGNOSIS — N138 Other obstructive and reflux uropathy: Secondary | ICD-10-CM

## 2023-06-06 MED ORDER — TAMSULOSIN HCL 0.4 MG PO CAPS: 0.4000 mg | ORAL_CAPSULE | Freq: Every day | ORAL | 0 refills | Status: DC

## 2023-06-06 NOTE — Telephone Encounter (Signed)
 Patient called this morning and needed to r/s his LABS for today so we had to moved his appt with Dr. Estanislao Heimlich also. 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 We have r/s his LABS and office visit to June but he said he will run out of his Tamsulosin  before then. He would like to know if he can just get a 30 day supply to last him until his new appts in June. [facility] [address] [facility] [place].

## 2023-06-06 NOTE — Telephone Encounter (Signed)
 Refill sent in for 30 days, patient advised on voicemail

## 2023-06-09 ENCOUNTER — Ambulatory Visit: Payer: Self-pay | Admitting: Urology

## 2023-06-10 ENCOUNTER — Other Ambulatory Visit: Payer: Self-pay | Admitting: Family

## 2023-06-10 DIAGNOSIS — I480 Paroxysmal atrial fibrillation: Secondary | ICD-10-CM

## 2023-06-10 DIAGNOSIS — I4892 Unspecified atrial flutter: Secondary | ICD-10-CM

## 2023-06-13 ENCOUNTER — Ambulatory Visit: Admitting: Family

## 2023-06-20 ENCOUNTER — Ambulatory Visit: Admitting: Family

## 2023-07-04 ENCOUNTER — Other Ambulatory Visit: Payer: PRIVATE HEALTH INSURANCE

## 2023-07-06 ENCOUNTER — Other Ambulatory Visit: Payer: Self-pay | Admitting: Family

## 2023-07-06 DIAGNOSIS — N138 Other obstructive and reflux uropathy: Secondary | ICD-10-CM

## 2023-07-07 MED ORDER — TAMSULOSIN HCL 0.4 MG PO CAPS: 0.4000 mg | ORAL_CAPSULE | Freq: Every day | ORAL | 0 refills | Status: DC

## 2023-07-07 NOTE — Addendum Note (Signed)
 Addended by: Natha Bair on: 07/07/2023 03:08 PM   Modules accepted: Orders

## 2023-07-14 NOTE — Telephone Encounter (Signed)
 Review message with pt from below He is overdue to see cardiology for afib.  Please give number to Dr Lilton Relic office and offer to schedule an appt for him

## 2023-07-15 ENCOUNTER — Other Ambulatory Visit: Payer: Self-pay

## 2023-07-15 DIAGNOSIS — R972 Elevated prostate specific antigen [PSA]: Secondary | ICD-10-CM

## 2023-07-18 ENCOUNTER — Other Ambulatory Visit: Payer: PRIVATE HEALTH INSURANCE

## 2023-07-20 ENCOUNTER — Other Ambulatory Visit: Payer: Self-pay

## 2023-07-20 ENCOUNTER — Telehealth: Payer: Self-pay | Admitting: Urology

## 2023-07-20 DIAGNOSIS — N401 Enlarged prostate with lower urinary tract symptoms: Secondary | ICD-10-CM

## 2023-07-20 MED ORDER — TAMSULOSIN HCL 0.4 MG PO CAPS: 0.4000 mg | ORAL_CAPSULE | Freq: Every day | ORAL | 0 refills | Status: DC

## 2023-07-20 NOTE — Telephone Encounter (Signed)
 Patient called and rescheduled tomorrows appointment due to being out of town for work. Patient is now scheduled for 09/15/23. He is wanting to know if a RX for Flomax  can possibly be sent to pharmacy for a small supply until his appointment in July. Patient states he has about 5 days left of his medication. Please advise patient.

## 2023-07-20 NOTE — Telephone Encounter (Signed)
Short term RX sent.

## 2023-07-21 ENCOUNTER — Ambulatory Visit: Payer: PRIVATE HEALTH INSURANCE | Admitting: Urology

## 2023-08-02 ENCOUNTER — Encounter: Payer: Self-pay | Admitting: Urology

## 2023-08-09 ENCOUNTER — Other Ambulatory Visit: Payer: Self-pay | Admitting: Family

## 2023-08-09 ENCOUNTER — Other Ambulatory Visit: Payer: Self-pay | Admitting: Urology

## 2023-08-09 DIAGNOSIS — I480 Paroxysmal atrial fibrillation: Secondary | ICD-10-CM

## 2023-08-09 DIAGNOSIS — I4892 Unspecified atrial flutter: Secondary | ICD-10-CM

## 2023-08-09 DIAGNOSIS — N138 Other obstructive and reflux uropathy: Secondary | ICD-10-CM

## 2023-08-11 NOTE — Telephone Encounter (Signed)
 LVM to inform pt of below message per Rollene, please relay and ask question and document   30 day refill of medication provided He is overdue for follow up with me and cardiology. Please sch for further medication refills

## 2023-08-26 ENCOUNTER — Other Ambulatory Visit: Payer: Self-pay | Admitting: Family

## 2023-08-26 DIAGNOSIS — N401 Enlarged prostate with lower urinary tract symptoms: Secondary | ICD-10-CM

## 2023-08-26 NOTE — Telephone Encounter (Signed)
 LVM to inform pt of below message per [facility]   Urology is managing/refilling Flomax 

## 2023-09-15 ENCOUNTER — Ambulatory Visit: Payer: PRIVATE HEALTH INSURANCE | Admitting: Urology

## 2023-12-03 ENCOUNTER — Other Ambulatory Visit: Payer: Self-pay | Admitting: Family

## 2023-12-03 DIAGNOSIS — I480 Paroxysmal atrial fibrillation: Secondary | ICD-10-CM

## 2023-12-03 DIAGNOSIS — I4892 Unspecified atrial flutter: Secondary | ICD-10-CM

## 2023-12-08 ENCOUNTER — Other Ambulatory Visit: Payer: Self-pay | Admitting: Urology

## 2023-12-08 DIAGNOSIS — N138 Other obstructive and reflux uropathy: Secondary | ICD-10-CM

## 2023-12-09 ENCOUNTER — Telehealth: Payer: Self-pay | Admitting: Urology

## 2023-12-09 DIAGNOSIS — N401 Enlarged prostate with lower urinary tract symptoms: Secondary | ICD-10-CM

## 2023-12-09 MED ORDER — TAMSULOSIN HCL 0.4 MG PO CAPS: 0.4000 mg | ORAL_CAPSULE | Freq: Every day | ORAL | 0 refills | Status: DC

## 2023-12-09 NOTE — Telephone Encounter (Signed)
 Patient called to reschedule his one year follow up appointment that he missed in July. He is currently scheduled for 12/20/23 with Dr. Francisca. He is out of his flomax  prescription, and is asking if he can have refill to last until his appointment. He said he has been having a hard time without it. Please advise patient

## 2023-12-09 NOTE — Telephone Encounter (Signed)
 14 capsules sent in to last patient until his 12/20/23 appt, patient has no showed or canceled x 3.

## 2023-12-20 ENCOUNTER — Ambulatory Visit (INDEPENDENT_AMBULATORY_CARE_PROVIDER_SITE_OTHER): Payer: PRIVATE HEALTH INSURANCE | Admitting: Urology

## 2023-12-20 VITALS — BP 172/96 | HR 66 | Wt 193.2 lb

## 2023-12-20 DIAGNOSIS — N401 Enlarged prostate with lower urinary tract symptoms: Secondary | ICD-10-CM

## 2023-12-20 DIAGNOSIS — N138 Other obstructive and reflux uropathy: Secondary | ICD-10-CM | POA: Diagnosis not present

## 2023-12-20 DIAGNOSIS — R972 Elevated prostate specific antigen [PSA]: Secondary | ICD-10-CM | POA: Diagnosis not present

## 2023-12-20 LAB — BLADDER SCAN AMB NON-IMAGING

## 2023-12-20 MED ORDER — TAMSULOSIN HCL 0.4 MG PO CAPS: 0.4000 mg | ORAL_CAPSULE | Freq: Every day | ORAL | 3 refills | Status: AC

## 2023-12-20 NOTE — Progress Notes (Signed)
   12/20/2023 3:10 PM   Jeffrey Ortiz [birth year] [id number]  Reason for visit: Follow up BPH, elevated PSA  History: Initial visit July 2020 for obstructive urinary symptoms and nocturia 3-4 times at night, mildly elevated PVRs.  Symptoms resolved with Flomax  PSA has been stable and very mildly elevated approximately 4 since 2016 Evaluation for outlet procedures February 2022 showed 85 g prostate with lateral lobe hypertrophy, high bladder neck, intravesical protrusion of prostate  Physical Exam: BP (!) 172/96 (BP Location: Left Arm, Patient Position: Sitting, Cuff Size: Normal)   Pulse 66   Wt 193 lb 3.2 oz (87.6 kg)   SpO2 97%   BMI 26.95 kg/m   Imaging/labs: Most recent PSA 4.8 from 2022, stable from 4.1 in 2016, most recent PSA ordered but not drawn  Today: Overall doing well on the Flomax , he may be interested in pursuing HOLEP for more definitive management.  Primary urinary complaint is weak stream and double voiding  Plan:   BPH: Discussed options including continuing Flomax  alone or more definitive treatment with HOLEP.  He is leaning toward HOLEP. We discussed the risks and benefits of HoLEP at length.  The procedure requires general anesthesia and takes 1 to 2 hours, and a holmium laser is used to enucleate the prostate and push this tissue into the bladder.  A morcellator is then used to remove this tissue, which is sent for pathology.  The vast majority(>95%) of patients are able to discharge the same day with a catheter in place for 2 to 3 days, and will follow-up in clinic for a voiding trial.  We specifically discussed the risks of bleeding, infection, retrograde ejaculation, temporary urgency and urge incontinence, very low risk of long-term incontinence, urethral stricture/bladder neck contracture, pathologic evaluation of prostate tissue and possible detection of prostate cancer or other malignancy, and possible need for additional procedures. Flomax  refilled, can  schedule HOLEP(85g) if patient desires(will need orders).  Repeat PSA   Redell JAYSON Burnet, MD  [facility] [address], [address] [place], [place] [address] [phone]

## 2023-12-20 NOTE — Patient Instructions (Signed)

## 2023-12-21 ENCOUNTER — Other Ambulatory Visit: Payer: Self-pay | Admitting: Family

## 2024-01-01 NOTE — Progress Notes (Unsigned)
 Patient ID: Jeffrey Ortiz, male   DOB: September 14, 1955, 68 y.o.   MRN: [id number] Cardiology Office Note  Date:  01/02/2024   ID:  Jeffrey Ortiz, DOB 04-08-1955, MRN [id number]  PCP:  Dineen Rollene MATSU, FNP   Chief Complaint  Patient presents with   12 month follow up     Doing well.     HPI:  [name] is a 68 yo-old gentleman with a history of  GERD,  diabetes, hypertension, hyperlipidemia,  hospital 08/11/2015 with worsening shortness of breath, orthopnea, PND after severe GERD, vomiting, aspiration into his sinuses noted to be in atrial flutter with RVR underwent TEE with cardioversion Cardioversion was initially successful though converted to atrial fibrillation Later back on the floor he went back into atrial flutter Atrial flutter-recurrent-typical  S/p ablation  Last seen in clinic 2017 Cardiomyopathy ejection fraction 25%-30% March 2023 while in atrial fibrillation He presents for follow-up of his atrial fibrillation, persistent  LOV 11/23  In follow-up today reports feeling well BP elevated on last several clinic visits, has not been checking at home No arrhythmia, asymptomatic Has watch in place that monitors for arrhythmia, Periodically has elevated heart rate but not sure if it is A-fib Rare metoprolol  use for elevated heart rate  Denies shortness of breath or chest pain on exertion Continues to work, thinking of retiring soon  Repeat blood pressure by myself 170 over 90s  EKG personally reviewed by myself on todays visit EKG Interpretation Date/Time:  Monday January 02 2024 08:23:26 EST Ventricular Rate:  56 PR Interval:  150 QRS Duration:  88 QT Interval:  442 QTC Calculation: 426 R Axis:   38  Text Interpretation: Sinus bradycardia When compared with ECG of 14-Apr-2022 14:03, No significant change was found Confirmed by Perla Lye [phone]) on 01/02/2024 9:01:49 AM   Medical history reviewed Seen by EP March 2023 Started on  amiodarone  Underwent cardioversion,  Ablation 10/06/21  Prior cardiac testing reviewed Echocardiogram 06/19/21 Ejection fraction has improved up to 45 to 50%, previously was 25 to 30%  PMH:   has a past medical history of Atrial flutter (HCC), Chronic systolic CHF (congestive heart failure) (HCC), Essential hypertension, Hyperlipidemia, Hypertension, Overweight, Sleep apnea, and Type II diabetes mellitus (HCC).  PSH:    Past Surgical History:  Procedure Laterality Date   ATRIAL FIBRILLATION ABLATION N/A 10/05/2021   Procedure: ATRIAL FIBRILLATION ABLATION;  Surgeon: Cindie Ole DASEN, MD;  Location: [facility] INVASIVE CV LAB;  Service: Cardiovascular;  Laterality: N/A;   COLONOSCOPY WITH PROPOFOL  N/A 08/10/2018   Procedure: COLONOSCOPY WITH PROPOFOL ;  Surgeon: Janalyn Keene NOVAK, MD;  Location: [facility] ENDOSCOPY;  Service: Endoscopy;  Laterality: N/A;   ELECTROPHYSIOLOGIC STUDY N/A 09/26/2015   Procedure: A-Flutter;  Surgeon: Elspeth JAYSON Sage, MD;  Location: [facility] INVASIVE CV LAB;  Service: Cardiovascular;  Laterality: N/A;   FLEXIBLE SIGMOIDOSCOPY N/A 04/14/2022   Procedure: FLEXIBLE SIGMOIDOSCOPY;  Surgeon: Federico Rosario JAYSON, MD;  Location: [facility] ENDOSCOPY;  Service: Gastroenterology;  Laterality: N/A;   HEMOSTASIS CONTROL  04/14/2022   Procedure: HEMOSTASIS CONTROL;  Surgeon: Federico Rosario JAYSON, MD;  Location: [facility] ENDOSCOPY;  Service: Gastroenterology;;  pure stat and hemospray   IR ANGIOGRAM SELECTIVE EACH ADDITIONAL VESSEL  04/14/2022   IR ANGIOGRAM SELECTIVE EACH ADDITIONAL VESSEL  04/14/2022   IR ANGIOGRAM SELECTIVE EACH ADDITIONAL VESSEL  04/14/2022   IR ANGIOGRAM VISCERAL SELECTIVE  04/14/2022   IR EMBO ART  VEN HEMORR LYMPH EXTRAV  INC GUIDE ROADMAPPING  04/14/2022   IR FLUORO GUIDE CV LINE  RIGHT  04/14/2022   IR US  GUIDE VASC ACCESS RIGHT  04/14/2022   IR US  GUIDE VASC ACCESS RIGHT  04/14/2022   TEE WITHOUT CARDIOVERSION N/A 08/13/2015   Procedure: TRANSESOPHAGEAL ECHOCARDIOGRAM (TEE) and cardioversion;  Surgeon:  Jeffrey JINNY Lunger, MD;  Location: [facility] ORS;  Service: Cardiovascular;  Laterality: N/A;    Current Outpatient Medications  Medication Sig Dispense Refill   amiodarone  (PACERONE ) 200 MG tablet Take 1 tablet by mouth once daily 90 tablet 0   dapagliflozin  propanediol (FARXIGA ) 10 MG TABS tablet Take 1 tablet (10 mg total) by mouth daily before breakfast. 30 tablet 3   furosemide  (LASIX ) 40 MG tablet Take 40 mg by mouth daily as needed.     losartan  (COZAAR ) 100 MG tablet Take 1 tablet (100 mg total) by mouth daily. 90 tablet 3   metFORMIN  (GLUCOPHAGE ) 500 MG tablet Take 2 tablets (1,000 mg total) by mouth 2 (two) times daily. 360 tablet 1   metoprolol  succinate (TOPROL -XL) 100 MG 24 hr tablet TAKE 1 TABLET BY MOUTH TWICE A DAY 30 tablet 1   metoprolol  tartrate (LOPRESSOR ) 25 MG tablet TAKE 1 TAB BY MOUTH DAILY AS NEEDED 30 tablet 0   rosuvastatin  (CRESTOR ) 20 MG tablet Take 1 tablet (20 mg total) by mouth daily. 90 tablet 3   tamsulosin  (FLOMAX ) 0.4 MG CAPS capsule Take 1 capsule (0.4 mg total) by mouth daily. Further refills need to come from urology 90 capsule 3   apixaban  (ELIQUIS ) 5 MG TABS tablet Take 1 tablet (5 mg total) by mouth 2 (two) times daily. (Patient not taking: Reported on 01/02/2024) 180 tablet 3   blood glucose meter kit and supplies KIT Use to check blood sugar once daily Dispense based on patient and insurance preference. (Patient not taking: Reported on 01/02/2024) 1 each 0   No current facility-administered medications for this visit.     Allergies:   Patient has no known allergies.   Social History:  The patient  reports that he has never smoked. He has never been exposed to tobacco smoke. He has never used smokeless tobacco. He reports that he does not drink alcohol and does not use drugs.   Family History:   family history includes Cancer in his father; Diabetes in his brother; Healthy (age of onset: 38) in his mother; Heart attack in his brother and father.     Review of Systems: Review of Systems  Constitutional: Negative.   HENT: Negative.    Respiratory: Negative.    Cardiovascular: Negative.   Gastrointestinal: Negative.   Musculoskeletal: Negative.   Neurological: Negative.   Psychiatric/Behavioral: Negative.    All other systems reviewed and are negative.   PHYSICAL EXAM: VS:  BP (!) 170/96 (BP Location: Left Arm, Patient Position: Sitting, Cuff Size: Normal)   Pulse (!) 56   Ht 6' (1.829 m)   Wt 202 lb 8 oz (91.9 kg)   SpO2 98%   BMI 27.46 kg/m  , BMI Body mass index is 27.46 kg/m. Constitutional:  oriented to person, place, and time. No distress.  HENT:  Head: Grossly normal Eyes:  no discharge. No scleral icterus.  Neck: No JVD, no carotid bruits  Cardiovascular: Regular rate and rhythm, no murmurs appreciated Pulmonary/Chest: Clear to auscultation bilaterally, no wheezes or rails Abdominal: Soft.  no distension.  no tenderness.  Musculoskeletal: Normal range of motion Neurological:  normal muscle tone. Coordination normal. No atrophy Skin: Skin warm and dry Psychiatric: normal affect, pleasant  Recent Labs: 01/10/2023: Hemoglobin  17.7; Platelets 251.0; TSH 1.75 04/11/2023: ALT 16; BUN 12; Creatinine, Ser 1.05; Potassium 4.2; Sodium 141    Lipid Panel Lab Results  Component Value Date   CHOL 197 04/11/2023   HDL 49.40 04/11/2023   LDLCALC 122 (H) 04/11/2023   TRIG 130.0 04/11/2023      Wt Readings from Last 3 Encounters:  01/02/24 202 lb 8 oz (91.9 kg)  12/20/23 193 lb 3.2 oz (87.6 kg)  01/10/23 199 lb 9.6 oz (90.5 kg)     ASSESSMENT AND PLAN:  Atrial flutter with rapid ventricular response (HCC)  Prior ablation, denies significant arrhythmia On metoprolol  succinate 100 twice daily, amiodarone , Eliquis  TSH and LFTs normal Maintaining normal sinus rhythm Takes metoprolol  sparingly for past heart rate  Persistent atrial fibrillation  ablation August 2023 Recommended he continue amiodarone   metoprolol   Pradaxa in place of Eliquis  given cost  Essential hypertension Blood pressure running high recommend he continue metoprolol  succinate 100 twice daily losartan  100 in the morning start amlodipine 10 in the evening Close monitoring of blood pressure at home  Cardiomyopathy Ejection fraction has improved up to 45 to 50%, previously was 25 to 30% Continue Farxiga , metoprolol  succinate Continue losartan  100 daily Stressed importance of aggressive blood pressure control as detailed above Difficulty affording branded drugs, will hold off on Entresto  Orders Placed This Encounter  Procedures   EKG 12-Lead     Signed, Velinda Ortiz, M.D., Ph.D. 01/02/2024  [facility] [place], [place] [phone]

## 2024-01-02 ENCOUNTER — Encounter: Payer: Self-pay | Admitting: Cardiovascular Disease

## 2024-01-02 ENCOUNTER — Ambulatory Visit: Payer: PRIVATE HEALTH INSURANCE | Attending: Cardiovascular Disease | Admitting: Cardiovascular Disease

## 2024-01-02 VITALS — BP 170/96 | HR 56 | Ht 72.0 in | Wt 202.5 lb

## 2024-01-02 DIAGNOSIS — I480 Paroxysmal atrial fibrillation: Secondary | ICD-10-CM | POA: Insufficient documentation

## 2024-01-02 DIAGNOSIS — I4892 Unspecified atrial flutter: Secondary | ICD-10-CM | POA: Diagnosis present

## 2024-01-02 DIAGNOSIS — I5022 Chronic systolic (congestive) heart failure: Secondary | ICD-10-CM | POA: Insufficient documentation

## 2024-01-02 DIAGNOSIS — I10 Essential (primary) hypertension: Secondary | ICD-10-CM | POA: Diagnosis present

## 2024-01-02 DIAGNOSIS — E119 Type 2 diabetes mellitus without complications: Secondary | ICD-10-CM | POA: Insufficient documentation

## 2024-01-02 DIAGNOSIS — E782 Mixed hyperlipidemia: Secondary | ICD-10-CM | POA: Diagnosis present

## 2024-01-02 MED ORDER — AMIODARONE HCL 200 MG PO TABS: 200.0000 mg | ORAL_TABLET | Freq: Every day | ORAL | 3 refills | Status: AC

## 2024-01-02 MED ORDER — AMLODIPINE BESYLATE 10 MG PO TABS: 10.0000 mg | ORAL_TABLET | Freq: Every day | ORAL | 3 refills | Status: AC

## 2024-01-02 MED ORDER — DABIGATRAN ETEXILATE MESYLATE 150 MG PO CAPS: 150.0000 mg | ORAL_CAPSULE | Freq: Two times a day (BID) | ORAL | 6 refills | Status: AC

## 2024-01-02 NOTE — Patient Instructions (Addendum)
 Monitor blood pressure at home  Medication Instructions:   Change eliquis  to pradaxa 150 mg twice a day  Please check pressures,  If >140 on the top, Start amlodipine 10 mg daily  If you need a refill on your cardiac medications before your next appointment, please call your pharmacy.   Lab work: No new labs needed  Testing/Procedures: No new testing needed  Follow-Up: At [facility], you and your health needs are our priority.  As part of our continuing mission to provide you with exceptional heart care, we have created designated Provider Care Teams.  These Care Teams include your primary Cardiologist (physician) and Advanced Practice Providers (APPs -  Physician Assistants and Nurse Practitioners) who all work together to provide you with the care you need, when you need it.  You will need a follow up appointment in 12 months  Providers on your designated Care Team:   Lonni Meager, NP Bernardino Bring, PA-C Cadence [place], [place]  COVID-19 Vaccine Information can be found at: [email or web address] For questions related to vaccine distribution or appointments, please email [email or web address] or call [phone].

## 2024-02-28 ENCOUNTER — Ambulatory Visit: Payer: PRIVATE HEALTH INSURANCE

## 2024-02-29 ENCOUNTER — Other Ambulatory Visit: Payer: Self-pay | Admitting: Family

## 2024-02-29 DIAGNOSIS — I10 Essential (primary) hypertension: Secondary | ICD-10-CM

## 2024-03-10 ENCOUNTER — Other Ambulatory Visit: Payer: Self-pay | Admitting: Family

## 2024-03-12 ENCOUNTER — Telehealth: Payer: Self-pay

## 2024-03-12 NOTE — Telephone Encounter (Signed)
"  °  °  Detailed vm left asking pt to CB to schedule an appt as his last office visit was 01-10-23 , he has refill request but sue to protocol pt needs an appt     "

## 2024-03-12 NOTE — Telephone Encounter (Signed)
 Detailed vm left asking pt to CB to schedule an appt as his last office visit was 01-10-23 , he has refill request but sue to protocol pt needs an appt

## 2024-03-12 NOTE — Telephone Encounter (Signed)
 Detailed vm left asking pt to CB to schedule an appt as his last office visit was 01-10-23 , he has refill request but sue to protocol pt needs an appt    E2C2 PLEASE RELAY INFO AND HAVE PT SCHEDULED

## 2024-03-14 ENCOUNTER — Other Ambulatory Visit: Payer: Self-pay

## 2024-03-14 MED ORDER — DAPAGLIFLOZIN PROPANEDIOL 10 MG PO TABS: 10.0000 mg | ORAL_TABLET | Freq: Every day | ORAL | 0 refills | Status: AC

## 2024-03-26 ENCOUNTER — Ambulatory Visit: Payer: PRIVATE HEALTH INSURANCE | Admitting: Family

## 2024-03-28 ENCOUNTER — Ambulatory Visit: Payer: PRIVATE HEALTH INSURANCE

## 2024-12-20 ENCOUNTER — Ambulatory Visit: Payer: PRIVATE HEALTH INSURANCE | Admitting: Urology
